# Patient Record
Sex: Female | Born: 1935 | Race: White | Hispanic: No | Smoking: Former smoker
Health system: Southern US, Community
[De-identification: ages and names within clinical notes are randomized; demographics above are authoritative.]

## PROBLEM LIST (undated history)

## (undated) DIAGNOSIS — N3281 Overactive bladder: Secondary | ICD-10-CM

## (undated) DIAGNOSIS — N200 Calculus of kidney: Secondary | ICD-10-CM

## (undated) DIAGNOSIS — L309 Dermatitis, unspecified: Secondary | ICD-10-CM

## (undated) DIAGNOSIS — T7840XA Allergy, unspecified, initial encounter: Secondary | ICD-10-CM

## (undated) DIAGNOSIS — I2699 Other pulmonary embolism without acute cor pulmonale: Secondary | ICD-10-CM

## (undated) DIAGNOSIS — G4730 Sleep apnea, unspecified: Secondary | ICD-10-CM

## (undated) DIAGNOSIS — K219 Gastro-esophageal reflux disease without esophagitis: Secondary | ICD-10-CM

## (undated) DIAGNOSIS — L570 Actinic keratosis: Secondary | ICD-10-CM

## (undated) DIAGNOSIS — I10 Essential (primary) hypertension: Secondary | ICD-10-CM

## (undated) DIAGNOSIS — R55 Syncope and collapse: Secondary | ICD-10-CM

## (undated) DIAGNOSIS — E785 Hyperlipidemia, unspecified: Secondary | ICD-10-CM

## (undated) HISTORY — DX: Allergy, unspecified, initial encounter: T78.40XA

## (undated) HISTORY — DX: Other pulmonary embolism without acute cor pulmonale: I26.99

## (undated) HISTORY — DX: Gastro-esophageal reflux disease without esophagitis: K21.9

## (undated) HISTORY — DX: Hyperlipidemia, unspecified: E78.5

## (undated) HISTORY — DX: Sleep apnea, unspecified: G47.30

## (undated) HISTORY — DX: Essential (primary) hypertension: I10

## (undated) HISTORY — DX: Actinic keratosis: L57.0

## (undated) HISTORY — DX: Syncope and collapse: R55

## (undated) HISTORY — PX: OTHER SURGICAL HISTORY: SHX169

## (undated) HISTORY — DX: Calculus of kidney: N20.0

## (undated) HISTORY — DX: Dermatitis, unspecified: L30.9

## (undated) HISTORY — DX: Overactive bladder: N32.81

---

## 2002-06-23 HISTORY — PX: COLONOSCOPY: SHX174

## 2002-08-23 ENCOUNTER — Other Ambulatory Visit: Admission: RE | Admit: 2002-08-23 | Discharge: 2002-08-23 | Payer: Self-pay | Admitting: Family Medicine

## 2002-08-23 ENCOUNTER — Encounter: Payer: Self-pay | Admitting: Family Medicine

## 2002-08-23 LAB — CONVERTED CEMR LAB: Pap Smear: NORMAL

## 2002-09-27 ENCOUNTER — Encounter: Payer: Self-pay | Admitting: Family Medicine

## 2002-09-27 ENCOUNTER — Encounter: Admission: RE | Admit: 2002-09-27 | Discharge: 2002-09-27 | Payer: Self-pay | Admitting: Family Medicine

## 2003-06-02 ENCOUNTER — Encounter: Admission: RE | Admit: 2003-06-02 | Discharge: 2003-06-02 | Payer: Self-pay | Admitting: Family Medicine

## 2004-07-31 ENCOUNTER — Encounter: Admission: RE | Admit: 2004-07-31 | Discharge: 2004-07-31 | Payer: Self-pay | Admitting: Family Medicine

## 2004-08-13 ENCOUNTER — Ambulatory Visit: Payer: Self-pay | Admitting: Family Medicine

## 2005-01-30 ENCOUNTER — Ambulatory Visit: Payer: Self-pay | Admitting: Family Medicine

## 2005-02-04 ENCOUNTER — Ambulatory Visit: Payer: Self-pay | Admitting: Family Medicine

## 2005-02-28 ENCOUNTER — Ambulatory Visit: Payer: Self-pay | Admitting: Family Medicine

## 2005-04-18 ENCOUNTER — Ambulatory Visit: Payer: Self-pay | Admitting: Family Medicine

## 2006-04-07 ENCOUNTER — Encounter: Admission: RE | Admit: 2006-04-07 | Discharge: 2006-04-07 | Payer: Self-pay | Admitting: Family Medicine

## 2006-04-14 ENCOUNTER — Ambulatory Visit: Payer: Self-pay | Admitting: Family Medicine

## 2006-08-13 ENCOUNTER — Ambulatory Visit: Payer: Self-pay | Admitting: Family Medicine

## 2006-08-13 LAB — CONVERTED CEMR LAB
ALT: 19 units/L (ref 0–40)
AST: 20 units/L (ref 0–37)
Albumin: 3.6 g/dL (ref 3.5–5.2)
BUN: 9 mg/dL (ref 6–23)
Basophils Absolute: 0.1 10*3/uL (ref 0.0–0.1)
Basophils Relative: 1 % (ref 0.0–1.0)
CO2: 31 meq/L (ref 19–32)
Calcium: 9.3 mg/dL (ref 8.4–10.5)
Chloride: 102 meq/L (ref 96–112)
Cholesterol: 197 mg/dL (ref 0–200)
Creatinine, Ser: 0.7 mg/dL (ref 0.4–1.2)
Eosinophils Absolute: 0.3 10*3/uL (ref 0.0–0.6)
Eosinophils Relative: 4.4 % (ref 0.0–5.0)
GFR calc Af Amer: 104 mL/min
GFR calc non Af Amer: 86 mL/min
Glucose, Bld: 105 mg/dL — ABNORMAL HIGH (ref 70–99)
HCT: 42 % (ref 36.0–46.0)
HDL: 46.3 mg/dL (ref >39.0)
Hemoglobin: 14.5 g/dL (ref 12.0–15.0)
LDL Cholesterol: 120 mg/dL — ABNORMAL HIGH (ref 0–99)
Lymphocytes Relative: 41.7 % (ref 12.0–46.0)
MCHC: 34.6 g/dL (ref 30.0–36.0)
MCV: 87 fL (ref 78.0–100.0)
Monocytes Absolute: 0.7 10*3/uL (ref 0.2–0.7)
Monocytes Relative: 8.8 % (ref 3.0–11.0)
Neutro Abs: 3.5 10*3/uL (ref 1.4–7.7)
Neutrophils Relative %: 44.1 % (ref 43.0–77.0)
Phosphorus: 2.7 mg/dL (ref 2.3–4.6)
Platelets: 318 10*3/uL (ref 150–400)
Potassium: 3.8 meq/L (ref 3.5–5.1)
RBC: 4.83 M/uL (ref 3.87–5.11)
RDW: 12.6 % (ref 11.5–14.6)
Sodium: 140 meq/L (ref 135–145)
TSH: 2.4 microintl units/mL (ref 0.35–5.50)
Total CHOL/HDL Ratio: 4.3
Triglycerides: 154 mg/dL — ABNORMAL HIGH (ref 0–149)
VLDL: 31 mg/dL (ref 0–40)
WBC: 7.9 10*3/uL (ref 4.5–10.5)

## 2006-11-20 ENCOUNTER — Telehealth (INDEPENDENT_AMBULATORY_CARE_PROVIDER_SITE_OTHER): Payer: Self-pay | Admitting: *Deleted

## 2007-02-20 ENCOUNTER — Ambulatory Visit: Payer: Self-pay | Admitting: Family Medicine

## 2007-08-09 ENCOUNTER — Encounter: Payer: Self-pay | Admitting: Family Medicine

## 2007-08-10 ENCOUNTER — Encounter: Payer: Self-pay | Admitting: Family Medicine

## 2007-11-01 ENCOUNTER — Encounter: Payer: Self-pay | Admitting: Family Medicine

## 2007-11-09 ENCOUNTER — Telehealth: Payer: Self-pay | Admitting: Family Medicine

## 2007-11-10 ENCOUNTER — Ambulatory Visit: Payer: Self-pay | Admitting: Family Medicine

## 2007-11-10 DIAGNOSIS — N318 Other neuromuscular dysfunction of bladder: Secondary | ICD-10-CM | POA: Insufficient documentation

## 2007-11-10 DIAGNOSIS — J309 Allergic rhinitis, unspecified: Secondary | ICD-10-CM | POA: Insufficient documentation

## 2007-11-10 DIAGNOSIS — G4730 Sleep apnea, unspecified: Secondary | ICD-10-CM | POA: Insufficient documentation

## 2007-11-10 DIAGNOSIS — K219 Gastro-esophageal reflux disease without esophagitis: Secondary | ICD-10-CM | POA: Insufficient documentation

## 2007-11-10 DIAGNOSIS — R0989 Other specified symptoms and signs involving the circulatory and respiratory systems: Secondary | ICD-10-CM | POA: Insufficient documentation

## 2007-11-10 DIAGNOSIS — K449 Diaphragmatic hernia without obstruction or gangrene: Secondary | ICD-10-CM | POA: Insufficient documentation

## 2007-11-10 DIAGNOSIS — I10 Essential (primary) hypertension: Secondary | ICD-10-CM | POA: Insufficient documentation

## 2007-11-10 DIAGNOSIS — M1990 Unspecified osteoarthritis, unspecified site: Secondary | ICD-10-CM | POA: Insufficient documentation

## 2007-11-10 DIAGNOSIS — Z87442 Personal history of urinary calculi: Secondary | ICD-10-CM | POA: Insufficient documentation

## 2007-11-10 DIAGNOSIS — R0609 Other forms of dyspnea: Secondary | ICD-10-CM

## 2007-11-16 LAB — CONVERTED CEMR LAB
ALT: 21 units/L (ref 0–35)
AST: 20 units/L (ref 0–37)
Albumin: 4 g/dL (ref 3.5–5.2)
Alkaline Phosphatase: 78 units/L (ref 39–117)
BUN: 9 mg/dL (ref 6–23)
Basophils Absolute: 0.1 10*3/uL (ref 0.0–0.1)
Basophils Relative: 0.9 % (ref 0.0–1.0)
Bilirubin, Direct: 0.1 mg/dL (ref 0.0–0.3)
CO2: 31 meq/L (ref 19–32)
Calcium: 9.3 mg/dL (ref 8.4–10.5)
Chloride: 106 meq/L (ref 96–112)
Cholesterol: 195 mg/dL (ref 0–200)
Creatinine, Ser: 0.8 mg/dL (ref 0.4–1.2)
Eosinophils Absolute: 0.3 10*3/uL (ref 0.0–0.7)
Eosinophils Relative: 4.5 % (ref 0.0–5.0)
GFR calc Af Amer: 88 mL/min
GFR calc non Af Amer: 73 mL/min
Glucose, Bld: 93 mg/dL (ref 70–99)
HCT: 45.2 % (ref 36.0–46.0)
HDL: 43.8 mg/dL (ref >39.0)
Hemoglobin: 15.2 g/dL — ABNORMAL HIGH (ref 12.0–15.0)
LDL Cholesterol: 126 mg/dL — ABNORMAL HIGH (ref 0–99)
Lymphocytes Relative: 26 % (ref 12.0–46.0)
MCHC: 33.7 g/dL (ref 30.0–36.0)
MCV: 88.7 fL (ref 78.0–100.0)
Monocytes Absolute: 0.6 10*3/uL (ref 0.1–1.0)
Monocytes Relative: 8.3 % (ref 3.0–12.0)
Neutro Abs: 4.7 10*3/uL (ref 1.4–7.7)
Neutrophils Relative %: 60.3 % (ref 43.0–77.0)
Phosphorus: 3 mg/dL (ref 2.3–4.6)
Platelets: 306 10*3/uL (ref 150–400)
Potassium: 4.2 meq/L (ref 3.5–5.1)
Pro B Natriuretic peptide (BNP): 28 pg/mL (ref 0.0–100.0)
RBC: 5.1 M/uL (ref 3.87–5.11)
RDW: 13.1 % (ref 11.5–14.6)
Sodium: 143 meq/L (ref 135–145)
TSH: 2.09 microintl units/mL (ref 0.35–5.50)
Total Bilirubin: 0.8 mg/dL (ref 0.3–1.2)
Total CHOL/HDL Ratio: 4.5
Total Protein: 7.3 g/dL (ref 6.0–8.3)
Triglycerides: 124 mg/dL (ref 0–149)
VLDL: 25 mg/dL (ref 0–40)
WBC: 7.7 10*3/uL (ref 4.5–10.5)

## 2007-11-24 ENCOUNTER — Ambulatory Visit: Payer: Self-pay | Admitting: Family Medicine

## 2007-11-24 DIAGNOSIS — E785 Hyperlipidemia, unspecified: Secondary | ICD-10-CM | POA: Insufficient documentation

## 2007-11-29 ENCOUNTER — Encounter: Payer: Self-pay | Admitting: Family Medicine

## 2007-12-06 ENCOUNTER — Ambulatory Visit: Payer: Self-pay | Admitting: Cardiology

## 2007-12-09 ENCOUNTER — Ambulatory Visit: Payer: Self-pay | Admitting: Family Medicine

## 2007-12-10 ENCOUNTER — Telehealth: Payer: Self-pay | Admitting: Family Medicine

## 2007-12-10 LAB — CONVERTED CEMR LAB
Albumin: 3.8 g/dL (ref 3.5–5.2)
BUN: 14 mg/dL (ref 6–23)
CO2: 31 meq/L (ref 19–32)
Calcium: 9.4 mg/dL (ref 8.4–10.5)
Chloride: 96 meq/L (ref 96–112)
Creatinine, Ser: 0.7 mg/dL (ref 0.4–1.2)
GFR calc Af Amer: 103 mL/min
GFR calc non Af Amer: 85 mL/min
Glucose, Bld: 98 mg/dL (ref 70–99)
Phosphorus: 3.4 mg/dL (ref 2.3–4.6)
Potassium: 3.7 meq/L (ref 3.5–5.1)
Sodium: 136 meq/L (ref 135–145)

## 2007-12-13 ENCOUNTER — Encounter: Payer: Self-pay | Admitting: Family Medicine

## 2007-12-13 ENCOUNTER — Ambulatory Visit: Payer: Self-pay

## 2008-01-03 ENCOUNTER — Ambulatory Visit: Payer: Self-pay | Admitting: Cardiology

## 2008-07-07 ENCOUNTER — Telehealth: Payer: Self-pay | Admitting: Family Medicine

## 2008-07-10 ENCOUNTER — Ambulatory Visit: Payer: Self-pay | Admitting: Family Medicine

## 2008-07-18 ENCOUNTER — Ambulatory Visit: Payer: Self-pay | Admitting: Family Medicine

## 2008-07-19 LAB — CONVERTED CEMR LAB
Albumin: 3.3 g/dL — ABNORMAL LOW (ref 3.5–5.2)
BUN: 10 mg/dL (ref 6–23)
CO2: 29 meq/L (ref 19–32)
Calcium: 9.1 mg/dL (ref 8.4–10.5)
Chloride: 89 meq/L — ABNORMAL LOW (ref 96–112)
Creatinine, Ser: 0.8 mg/dL (ref 0.4–1.2)
GFR calc Af Amer: 88 mL/min
GFR calc non Af Amer: 73 mL/min
Glucose, Bld: 125 mg/dL — ABNORMAL HIGH (ref 70–99)
Phosphorus: 2 mg/dL — ABNORMAL LOW (ref 2.3–4.6)
Potassium: 3.2 meq/L — ABNORMAL LOW (ref 3.5–5.1)
Sodium: 128 meq/L — ABNORMAL LOW (ref 135–145)

## 2008-07-20 ENCOUNTER — Inpatient Hospital Stay (HOSPITAL_COMMUNITY): Admission: EM | Admit: 2008-07-20 | Discharge: 2008-07-26 | Payer: Self-pay | Admitting: Emergency Medicine

## 2008-07-20 ENCOUNTER — Encounter: Payer: Self-pay | Admitting: Family Medicine

## 2008-07-20 ENCOUNTER — Ambulatory Visit: Payer: Self-pay | Admitting: Internal Medicine

## 2008-07-21 ENCOUNTER — Encounter: Payer: Self-pay | Admitting: Internal Medicine

## 2008-07-22 ENCOUNTER — Encounter: Payer: Self-pay | Admitting: Family Medicine

## 2008-07-25 ENCOUNTER — Encounter: Payer: Self-pay | Admitting: Family Medicine

## 2008-07-31 ENCOUNTER — Ambulatory Visit: Payer: Self-pay | Admitting: Family Medicine

## 2008-07-31 ENCOUNTER — Telehealth: Payer: Self-pay | Admitting: Family Medicine

## 2008-07-31 DIAGNOSIS — K8020 Calculus of gallbladder without cholecystitis without obstruction: Secondary | ICD-10-CM | POA: Insufficient documentation

## 2008-07-31 DIAGNOSIS — R609 Edema, unspecified: Secondary | ICD-10-CM | POA: Insufficient documentation

## 2008-08-02 ENCOUNTER — Encounter (INDEPENDENT_AMBULATORY_CARE_PROVIDER_SITE_OTHER): Payer: Self-pay | Admitting: *Deleted

## 2008-08-02 LAB — CONVERTED CEMR LAB
Albumin: 3.4 g/dL — ABNORMAL LOW (ref 3.5–5.2)
BUN: 17 mg/dL (ref 6–23)
CO2: 27 meq/L (ref 19–32)
Calcium: 9.9 mg/dL (ref 8.4–10.5)
Chloride: 95 meq/L — ABNORMAL LOW (ref 96–112)
Creatinine, Ser: 0.8 mg/dL (ref 0.4–1.2)
GFR calc Af Amer: 88 mL/min
GFR calc non Af Amer: 73 mL/min
Glucose, Bld: 101 mg/dL — ABNORMAL HIGH (ref 70–99)
Phosphorus: 3.4 mg/dL (ref 2.3–4.6)
Potassium: 4.5 meq/L (ref 3.5–5.1)
Sodium: 130 meq/L — ABNORMAL LOW (ref 135–145)

## 2008-08-03 ENCOUNTER — Telehealth: Payer: Self-pay | Admitting: Family Medicine

## 2008-08-07 ENCOUNTER — Encounter (INDEPENDENT_AMBULATORY_CARE_PROVIDER_SITE_OTHER): Payer: Self-pay | Admitting: *Deleted

## 2008-09-11 ENCOUNTER — Telehealth: Payer: Self-pay | Admitting: Family Medicine

## 2008-09-12 ENCOUNTER — Ambulatory Visit: Payer: Self-pay | Admitting: Family Medicine

## 2008-09-12 LAB — CONVERTED CEMR LAB
BUN: 15 mg/dL (ref 6–23)
CO2: 32 meq/L (ref 19–32)
Calcium: 9.4 mg/dL (ref 8.4–10.5)
Chloride: 102 meq/L (ref 96–112)
Creatinine, Ser: 0.6 mg/dL (ref 0.4–1.2)
GFR calc non Af Amer: 101.5 mL/min (ref >60)
Glucose, Bld: 93 mg/dL (ref 70–99)
Potassium: 3.7 meq/L (ref 3.5–5.1)
Sodium: 141 meq/L (ref 135–145)

## 2008-09-25 ENCOUNTER — Ambulatory Visit: Payer: Self-pay | Admitting: Family Medicine

## 2008-10-25 ENCOUNTER — Ambulatory Visit: Payer: Self-pay | Admitting: Family Medicine

## 2008-10-26 LAB — CONVERTED CEMR LAB
BUN: 13 mg/dL (ref 6–23)
CO2: 32 meq/L (ref 19–32)
Calcium: 9.7 mg/dL (ref 8.4–10.5)
Chloride: 105 meq/L (ref 96–112)
Creatinine, Ser: 0.7 mg/dL (ref 0.4–1.2)
GFR calc non Af Amer: 84.93 mL/min (ref >60)
Glucose, Bld: 102 mg/dL — ABNORMAL HIGH (ref 70–99)
Potassium: 3.7 meq/L (ref 3.5–5.1)
Sodium: 142 meq/L (ref 135–145)

## 2009-03-26 ENCOUNTER — Ambulatory Visit: Payer: Self-pay | Admitting: Family Medicine

## 2009-04-03 LAB — CONVERTED CEMR LAB
Albumin: 4.1 g/dL (ref 3.5–5.2)
BUN: 10 mg/dL (ref 6–23)
Basophils Absolute: 0.1 10*3/uL (ref 0.0–0.1)
Basophils Relative: 0.5 % (ref 0.0–3.0)
CO2: 32 meq/L (ref 19–32)
Calcium: 10.1 mg/dL (ref 8.4–10.5)
Chloride: 101 meq/L (ref 96–112)
Creatinine, Ser: 0.7 mg/dL (ref 0.4–1.2)
Eosinophils Absolute: 0.2 10*3/uL (ref 0.0–0.7)
Eosinophils Relative: 2.3 % (ref 0.0–5.0)
Folate: >20 ng/mL
GFR calc non Af Amer: 84.85 mL/min (ref >60)
Glucose, Bld: 103 mg/dL — ABNORMAL HIGH (ref 70–99)
HCT: 44.9 % (ref 36.0–46.0)
Hemoglobin: 15.3 g/dL — ABNORMAL HIGH (ref 12.0–15.0)
Lymphocytes Relative: 24.1 % (ref 12.0–46.0)
Lymphs Abs: 2.6 10*3/uL (ref 0.7–4.0)
MCHC: 34.1 g/dL (ref 30.0–36.0)
MCV: 91 fL (ref 78.0–100.0)
Monocytes Absolute: 1 10*3/uL (ref 0.1–1.0)
Monocytes Relative: 9.5 % (ref 3.0–12.0)
Neutro Abs: 6.8 10*3/uL (ref 1.4–7.7)
Neutrophils Relative %: 63.6 % (ref 43.0–77.0)
Phosphorus: 2.4 mg/dL (ref 2.3–4.6)
Platelets: 317 10*3/uL (ref 150.0–400.0)
Potassium: 3.7 meq/L (ref 3.5–5.1)
RBC: 4.94 M/uL (ref 3.87–5.11)
RDW: 12.3 % (ref 11.5–14.6)
Sodium: 139 meq/L (ref 135–145)
TSH: 1.96 microintl units/mL (ref 0.35–5.50)
Vitamin B-12: 324 pg/mL (ref 211–911)
WBC: 10.7 10*3/uL — ABNORMAL HIGH (ref 4.5–10.5)

## 2009-04-04 ENCOUNTER — Encounter: Payer: Self-pay | Admitting: Family Medicine

## 2009-04-04 ENCOUNTER — Ambulatory Visit: Payer: Self-pay | Admitting: Ophthalmology

## 2009-04-17 ENCOUNTER — Ambulatory Visit: Payer: Self-pay | Admitting: Ophthalmology

## 2009-06-19 ENCOUNTER — Ambulatory Visit: Payer: Self-pay | Admitting: Ophthalmology

## 2009-06-26 ENCOUNTER — Ambulatory Visit: Payer: Self-pay | Admitting: Ophthalmology

## 2009-07-24 ENCOUNTER — Telehealth: Payer: Self-pay | Admitting: Family Medicine

## 2009-08-28 ENCOUNTER — Ambulatory Visit: Payer: Self-pay | Admitting: Family Medicine

## 2009-08-29 LAB — CONVERTED CEMR LAB
ALT: 15 units/L (ref 0–35)
AST: 18 units/L (ref 0–37)
Albumin: 4 g/dL (ref 3.5–5.2)
BUN: 10 mg/dL (ref 6–23)
Basophils Absolute: 0.1 10*3/uL (ref 0.0–0.1)
Basophils Relative: 0.8 % (ref 0.0–3.0)
CO2: 33 meq/L — ABNORMAL HIGH (ref 19–32)
Calcium: 10.1 mg/dL (ref 8.4–10.5)
Chloride: 107 meq/L (ref 96–112)
Cholesterol: 169 mg/dL (ref 0–200)
Creatinine, Ser: 0.8 mg/dL (ref 0.4–1.2)
Eosinophils Absolute: 0.2 10*3/uL (ref 0.0–0.7)
Eosinophils Relative: 3 % (ref 0.0–5.0)
GFR calc non Af Amer: 72.65 mL/min (ref >60)
Glucose, Bld: 83 mg/dL (ref 70–99)
HCT: 43.9 % (ref 36.0–46.0)
HDL: 58.6 mg/dL (ref >39.00)
Hemoglobin: 14.3 g/dL (ref 12.0–15.0)
LDL Cholesterol: 88 mg/dL (ref 0–99)
Lymphocytes Relative: 30.2 % (ref 12.0–46.0)
Lymphs Abs: 2.1 10*3/uL (ref 0.7–4.0)
MCHC: 32.5 g/dL (ref 30.0–36.0)
MCV: 93.2 fL (ref 78.0–100.0)
Monocytes Absolute: 0.6 10*3/uL (ref 0.1–1.0)
Monocytes Relative: 8.3 % (ref 3.0–12.0)
Neutro Abs: 4 10*3/uL (ref 1.4–7.7)
Neutrophils Relative %: 57.7 % (ref 43.0–77.0)
Phosphorus: 3.4 mg/dL (ref 2.3–4.6)
Platelets: 286 10*3/uL (ref 150.0–400.0)
Potassium: 5 meq/L (ref 3.5–5.1)
RBC: 4.7 M/uL (ref 3.87–5.11)
RDW: 12.5 % (ref 11.5–14.6)
Sodium: 143 meq/L (ref 135–145)
TSH: 2.24 microintl units/mL (ref 0.35–5.50)
Total CHOL/HDL Ratio: 3
Triglycerides: 113 mg/dL (ref 0.0–149.0)
VLDL: 22.6 mg/dL (ref 0.0–40.0)
WBC: 7 10*3/uL (ref 4.5–10.5)

## 2009-09-03 ENCOUNTER — Telehealth: Payer: Self-pay | Admitting: Family Medicine

## 2010-04-15 ENCOUNTER — Ambulatory Visit: Payer: Self-pay | Admitting: Family Medicine

## 2010-04-18 ENCOUNTER — Ambulatory Visit: Payer: Self-pay | Admitting: Family Medicine

## 2010-04-18 DIAGNOSIS — R059 Cough, unspecified: Secondary | ICD-10-CM | POA: Insufficient documentation

## 2010-04-18 DIAGNOSIS — R05 Cough: Secondary | ICD-10-CM

## 2010-07-25 NOTE — Progress Notes (Signed)
Summary: Rx Spironolactone  Phone Note Refill Request Call back at [phone] Message from:  [facility] on July 24, 2009 10:35 AM  Refills Requested: Medication #1:  ALDACTONE 25 MG TABS 1 by mouth once daily.   Last Refilled: 06/18/2009 Received faxed refill request, please advise   Method Requested: Electronic Initial call taken by: Linde Gillis CMA Duncan Dull),  July 24, 2009 10:35 AM  Follow-up for Phone Call        px written on EMR for call in  Follow-up by: Judith Part MD,  July 24, 2009 11:01 AM  Additional Follow-up for Phone Call Additional follow up Details #1::        Rx called to pharmacy Additional Follow-up by: Linde Gillis CMA Duncan Dull),  July 24, 2009 11:40 AM    Prescriptions: ALDACTONE 25 MG TABS (SPIRONOLACTONE) 1 by mouth once daily  #30 x 5   Entered and Authorized by:   Judith Part MD   Signed by:   Judith Part MD on 07/24/2009   Method used:   Telephoned to ...       [facility]* (retail)       [address]       [place], [place]  [address]       Ph: [phone]       Fax: [phone]   RxID:   [id number]

## 2010-07-25 NOTE — Assessment & Plan Note (Signed)
Summary: F/U REFILL MEDS/CLE   Vital Signs:  Patient profile:   77 year old female Height:      62 inches Weight:      187.50 pounds Temp:     98.3 degrees F oral Pulse rate:   80 / minute Pulse rhythm:   regular BP sitting:   136 / 76  (left arm) Cuff size:   large  Vitals Entered By: Lewanda Rife LPN (August 28, 9145 9:16 AM)   History of Present Illness: is here for visit and med refil for HTN and edema   she is feeling ok overall   wt is stable-- wants to work on that but too concerned about family problems   imp bp today 136/76- last vsit changed hctz to spironolactone needs labs for that still some edema but better  likes the aldactone better   ? due for lipids  is sort of watching sat fats in diet- too much red meat and eating out  did not get cxr last time-- no chance to do it  sob is not much better  sob without much exertion- she suspects from deconditioning and wt   lots of stress -husb had valve repl surgery -- at [facility] and cabg  he is turning the corner     Allergies: 1)  ! Codeine 2)  ! Levaquin 3)  ! * Oxytrol Patch 4)  ! Neosporin  Past History:  Past Medical History: Last updated: 09/25/2008 hyperlipidemia HH-- with GERD sleep apnea  overactive bladderOA kidney stones HTN  allergic rhinitis AKs  chronic dermatitis face (allergic to many products)  cardiology--Dr Ladell Heads  Past Surgical History: Last updated: 09/25/2008 see preload-- reviewed rectocele repair (NOT HYSTERECTOMY) broken nose cath at [facility] in [facility]- clear  4/03 stress testing with EF 77 %  MR and TR dexa nl 2002 colonosc polyp/tics 2004 6/09 adenosine cardiolite-- nl  6/09 CT lungs-- scarring in [facility] and [facility] 2010 pneumonia (also hyponatremia, hypokalemia)  Family History: Last updated: 2007-11-28 father CAD mother- died of stomach ca  Social History: Last updated: 08/28/2009 married- husband with coronary artery disease and valve dz has grand  children and great grandchildren non smoker-- quit years ago occ alcohol  Risk Factors: Smoking Status: quit (11/10/2007)  Social History: married- husband with coronary artery disease and valve dz has grand children and great grandchildren non smoker-- quit years ago occ alcohol  Review of Systems General:  Complains of fatigue; denies chills, fever, loss of appetite, and malaise. Eyes:  Denies blurring and eye irritation. CV:  Denies chest pain or discomfort, lightheadness, palpitations, and shortness of breath with exertion. Resp:  Denies cough, shortness of breath, and wheezing. GI:  Denies abdominal pain, bloody stools, change in bowel habits, and indigestion. GU:  Complains of incontinence; denies urinary frequency. MS:  Complains of stiffness. Derm:  Denies itching, lesion(s), poor wound healing, and rash. Neuro:  Denies numbness and tingling. Psych:  stressed but overall doing ok . Endo:  Denies excessive thirst and excessive urination. Heme:  Denies abnormal bruising and bleeding.  Physical Exam  General:  overweight but generally well appearing  Head:  normocephalic, atraumatic, and no abnormalities observed.   Eyes:  vision grossly intact, pupils equal, pupils round, pupils reactive to light, and no injection.   Mouth:  MMM Neck:  supple with full rom and no masses or thyromegally, no JVD or carotid bruit  Lungs:  Normal respiratory effort, chest expands symmetrically. Lungs are clear to auscultation,  no crackles or wheezes. Heart:  Normal rate and regular rhythm. S1 and S2 normal without gallop, murmur, click, rub or other extra sounds. Abdomen:  Bowel sounds positive,abdomen soft and non-tender without masses, organomegaly or hernias noted. Msk:  No deformity or scoliosis noted of thoracic or lumbar spine.   Pulses:  R and L carotid,radial,femoral,dorsalis pedis and posterior tibial pulses are full and equal bilaterally Extremities:  improved edema  Neurologic:   sensation intact to light touch, gait normal, and DTRs symmetrical and normal.   Skin:  Intact without suspicious lesions or rashes Cervical Nodes:  No lymphadenopathy noted Psych:  cheerful despite stress she describes   Impression & Recommendations:  Problem # 1:  HYPERTENSION (ICD-401.9) Assessment Improved  bp is improved and pt tol aldactone well  disc imp of diet and exercise no changes lab today Her updated medication list for this problem includes:    Diovan 160 Mg Tabs (Valsartan) .Marland Kitchen... 1 by mouth once daily    Verapamil Hcl Cr 240 Mg Cp24 (Verapamil hcl) .Marland Kitchen... 1 by mouth once daily    Aldactone 25 Mg Tabs (Spironolactone) .Marland Kitchen... 1 by mouth once daily  Orders: Venipuncture (42595) TLB-Lipid Panel (80061-LIPID) TLB-Renal Function Panel (80069-RENAL) TLB-ALT (SGPT) (84460-ALT) TLB-AST (SGOT) (84450-SGOT) TLB-TSH (Thyroid Stimulating Hormone) (84443-TSH) TLB-CBC Platelet - w/Differential (85025-CBCD)  BP today: 136/76 Prior BP: 158/84 (03/26/2009)  Labs Reviewed: K+: 3.7 (03/26/2009) Creat: : 0.7 (03/26/2009)   Chol: 195 (11/10/2007)   HDL: 43.8 (11/10/2007)   LDL: 126 (11/10/2007)   TG: 124 (11/10/2007)  Problem # 2:  EDEMA (ICD-782.3) Assessment: Improved improved on aldactone lab today disc low sodium diet  Her updated medication list for this problem includes:    Aldactone 25 Mg Tabs (Spironolactone) .Marland Kitchen... 1 by mouth once daily  Problem # 3:  HYPERLIPIDEMIA (ICD-272.4) Assessment: Unchanged  labs today rev low sat fat diet  will update f/u 6 mo  Orders: Venipuncture (63875) TLB-Lipid Panel (80061-LIPID) TLB-Renal Function Panel (80069-RENAL) TLB-ALT (SGPT) (84460-ALT) TLB-AST (SGOT) (84450-SGOT) TLB-TSH (Thyroid Stimulating Hormone) (84443-TSH) TLB-CBC Platelet - w/Differential (85025-CBCD)  Labs Reviewed: SGOT: 20 (11/10/2007)   SGPT: 21 (11/10/2007)   HDL:43.8 (11/10/2007), 46.3 (08/13/2006)  LDL:126 (11/10/2007), 120 (08/13/2006)  Chol:195  (11/10/2007), 197 (08/13/2006)  Trig:124 (11/10/2007), 154 (08/13/2006)  Problem # 4:  DYSPNEA ON EXERTION (ICD-786.09) Assessment: Unchanged ongoing - no change pt needs cxr for this and also post pneumonia- but has not gone urged to go - pend report Her updated medication list for this problem includes:    Aldactone 25 Mg Tabs (Spironolactone) .Marland Kitchen... 1 by mouth once daily  Complete Medication List: 1)  Nexium 40 Mg Cpdr (Esomeprazole magnesium) .Marland Kitchen.. 1 by mouth once daily 2)  Diovan 160 Mg Tabs (Valsartan) .Marland Kitchen.. 1 by mouth once daily 3)  Verapamil Hcl Cr 240 Mg Cp24 (Verapamil hcl) .Marland Kitchen.. 1 by mouth once daily 4)  Ambien 5 Mg Tabs (Zolpidem tartrate) .... Take one by mouth at bedtime as needed 5)  Aldactone 25 Mg Tabs (Spironolactone) .Marland Kitchen.. 1 by mouth once daily 6)  Juice Plus Fibre Liqd (Nutritional supplements) .... Otc as directed.  Patient Instructions: 1)  blood pressure is much better 120/78 2)  go get your chest x ray when you can  3)  I sent px to [facility] 4)  here are long term px for mail  5)  work on healthy diet and exercise  6)  follow up with me in about 6 months (earlier if needed)  Prescriptions: ALDACTONE 25 MG TABS (  SPIRONOLACTONE) 1 by mouth once daily  #90 x 3   Entered and Authorized by:   Judith Part MD   Signed by:   Judith Part MD on 08/28/2009   Method used:   Print then Give to Patient   RxID:   [id number] VERAPAMIL HCL CR 240 MG  CP24 (VERAPAMIL HCL) 1 by mouth once daily  #90 x 3   Entered and Authorized by:   Judith Part MD   Signed by:   Judith Part MD on 08/28/2009   Method used:   Print then Give to Patient   RxID:   [id number] DIOVAN 160 MG  TABS (VALSARTAN) 1 by mouth once daily  #90 x 3   Entered and Authorized by:   Judith Part MD   Signed by:   Judith Part MD on 08/28/2009   Method used:   Print then Give to Patient   RxID:   [id number] NEXIUM 40 MG CPDR (ESOMEPRAZOLE MAGNESIUM) 1 by mouth once daily   #90 x 3   Entered and Authorized by:   Judith Part MD   Signed by:   Judith Part MD on 08/28/2009   Method used:   Print then Give to Patient   RxID:   [id number] VERAPAMIL HCL CR 240 MG  CP24 (VERAPAMIL HCL) 1 by mouth once daily  #30 x 0   Entered and Authorized by:   Judith Part MD   Signed by:   Judith Part MD on 08/28/2009   Method used:   Electronically to        [facility]* (retail)       [address]       [place], [place]  [address]       Ph: [phone]       Fax: [phone]   RxID:   [id number] DIOVAN 160 MG  TABS (VALSARTAN) 1 by mouth once daily  #30 x 0   Entered and Authorized by:   Judith Part MD   Signed by:   Judith Part MD on 08/28/2009   Method used:   Electronically to        [facility]* (retail)       [address]       [place], [place]  [address]       Ph: [phone]       Fax: [phone]   RxID:   [id number] NEXIUM 40 MG CPDR (ESOMEPRAZOLE MAGNESIUM) 1 by mouth once daily  #30 x 0   Entered and Authorized by:   Judith Part MD   Signed by:   Judith Part MD on 08/28/2009   Method used:   Electronically to        [facility]* (retail)       [address]       [place], [place]  [address]       Ph: [phone]       Fax: [phone]   RxID:   [id number]   Current Allergies (reviewed today): ! CODEINE ! LEVAQUIN ! [facility] ! NEOSPORIN

## 2010-07-25 NOTE — Assessment & Plan Note (Signed)
Summary: ST,CONGESTION/CLE   Vital Signs:  Patient profile:   77 year old female Height:      62 inches Weight:      186.25 pounds BMI:     34.19 Temp:     98.2 degrees F oral Pulse rate:   76 / minute Pulse rhythm:   regular BP sitting:   120 / 72  (left arm) Cuff size:   large  Vitals Entered By: Lewanda Rife LPN (April 15, 2010 12:12 PM) CC: head congested, runny nose, drainage back of throat, productive cough with yellow mucus   History of Present Illness: was around a lot of people at a wedding 2 wk ago   got back and felt really tired  then started with head congestion and runny nose and post nasal drip  sore throat was mild  got better and then got worse again cold and hot  and now has productive cough  some nausea and no vomiting   needs refil on aldactone- doing well with that  this controls edema    Allergies: 1)  ! Codeine 2)  ! Levaquin 3)  ! * Oxytrol Patch 4)  ! Neosporin  Past History:  Past Medical History: Last updated: 09/25/2008 hyperlipidemia HH-- with GERD sleep apnea  overactive bladderOA kidney stones HTN  allergic rhinitis AKs  chronic dermatitis face (allergic to many products)  cardiology--Dr Ladell Heads  Past Surgical History: Last updated: 09/25/2008 see preload-- reviewed rectocele repair (NOT HYSTERECTOMY) broken nose cath at [facility] in 1980- clear  4/03 stress testing with EF 77 %  MR and TR dexa nl 2002 colonosc polyp/tics 2004 6/09 adenosine cardiolite-- nl  6/09 CT lungs-- scarring in [facility] and [facility] 2010 pneumonia (also hyponatremia, hypokalemia)  Family History: Last updated: 11/28/07 father CAD mother- died of stomach ca  Social History: Last updated: 08/28/2009 married- husband with coronary artery disease and valve dz has grand children and great grandchildren non smoker-- quit years ago occ alcohol  Risk Factors: Smoking Status: quit (11/10/2007)  Review of Systems General:  Complains of  chills, fatigue, and malaise. Eyes:  Denies blurring and eye pain. CV:  Denies chest pain or discomfort, palpitations, shortness of breath with exertion, swelling of feet, and swelling of hands. Resp:  Complains of cough and sputum productive; denies shortness of breath and wheezing. GI:  Denies diarrhea, nausea, and vomiting. Derm:  Denies rash. Neuro:  Denies numbness and tingling.  Physical Exam  General:  overweight but generally well appearing  Head:  normocephalic, atraumatic, and no abnormalities observed.  no sinus tenderness  Eyes:  vision grossly intact, pupils equal, pupils round, and pupils reactive to light.  no conjunctival pallor, injection or icterus  Ears:  R ear normal and L ear normal.   Nose:  nares are injected and congested bilaterally  Mouth:  pharynx pink and moist, no erythema, and postnasal drip.   Neck:  supple with full rom and no masses or thyromegally, no JVD or carotid bruit  Lungs:  mildly harsh bs at bases good air exch no rales/rhonchi or wheeze Heart:  Normal rate and regular rhythm. S1 and S2 normal without gallop, murmur, click, rub or other extra sounds. Pulses:  R and L carotid,radial,femoral,dorsalis pedis and posterior tibial pulses are full and equal bilaterally Extremities:  improved edema --no cce  Skin:  Intact without suspicious lesions or rashes Cervical Nodes:  No lymphadenopathy noted Psych:  normal affect, talkative and pleasant    Impression & Recommendations:  Problem # 1:  BRONCHITIS- ACUTE (ICD-466.0) Assessment New  mild following viral uri recommend sympt care- see pt instructions   pt advised to update me if symptoms worsen or do not improve  will cover with zpack and update Her updated medication list for this problem includes:    Zithromax Z-pak 250 Mg Tabs (Azithromycin) .Marland Kitchen... Take by mouth as directed  Orders: Prescription Created Electronically [phone])  Problem # 2:  EDEMA (ICD-782.3) Assessment:  Unchanged  this and HTN well controlled on aldactone this was refilled today Her updated medication list for this problem includes:    Aldactone 25 Mg Tabs (Spironolactone) .Marland Kitchen... 1 by mouth once daily  Discussed elevation of the legs, use of compression stockings, sodium restiction, and medication use.   Orders: Prescription Created Electronically [phone])  Complete Medication List: 1)  Nexium 40 Mg Cpdr (Esomeprazole magnesium) .Marland Kitchen.. 1 by mouth once daily 2)  Diovan 160 Mg Tabs (Valsartan) .Marland Kitchen.. 1 by mouth once daily 3)  Calan Sr 240 Mg Cr-tabs (Verapamil hcl) .Marland Kitchen.. 1 by mouth once daily 4)  Ambien 5 Mg Tabs (Zolpidem tartrate) .... Take one by mouth at bedtime as needed 5)  Aldactone 25 Mg Tabs (Spironolactone) .Marland Kitchen.. 1 by mouth once daily 6)  Juice Plus Fibre Liqd (Nutritional supplements) .... Otc as directed. 7)  Tylenol Extra Strength 500 Mg Tabs (Acetaminophen) .... Otc as directed. 8)  Zithromax Z-pak 250 Mg Tabs (Azithromycin) .... Take by mouth as directed  Patient Instructions: 1)  take the zithromax for possible bronchitis  2)  rest and get fluids  3)  mucinex is ok for congestion as is nasal saline spray 4)  update me if not improving within a week  Prescriptions: ALDACTONE 25 MG TABS (SPIRONOLACTONE) 1 by mouth once daily  #90 x 1   Entered and Authorized by:   Judith Part MD   Signed by:   Judith Part MD on 04/15/2010   Method used:   Electronically to        [facility]* (retail)       [address]       [place], [place]  [address]       Ph: [phone]       Fax: [phone]   RxID:   [id number] ZITHROMAX Z-PAK 250 MG TABS (AZITHROMYCIN) take by mouth as directed  #1 pack x 0   Entered and Authorized by:   Judith Part MD   Signed by:   Judith Part MD on 04/15/2010   Method used:   Electronically to        [facility]* (retail)       [address]       [place], [place]  [address]       Ph: [phone]       Fax: [phone]   RxID:    [id number]    Orders Added: 1)  Est. Patient Level IV [[address]] 2)  Prescription Created Electronically [phone]    Current Allergies (reviewed today): ! CODEINE ! LEVAQUIN ! [facility] ! NEOSPORIN

## 2010-07-25 NOTE — Progress Notes (Signed)
Summary: Alternative for Verelan  Phone Note From Pharmacy Call back at [phone]   Caller: [facility] Call For: Dr. Milinda Antis  Summary of Call: Received fax from pharmacy stating that Verelan is currently unavailable and has been for the last month.  Wants to know if you will be willing to switch to Calan?  Please advise. Initial call taken by: Linde Gillis CMA Duncan Dull),  September 03, 2009 4:15 PM  Follow-up for Phone Call        is fine to switch to equiv of calan -- just put on med list what he recommends and let pt know there will be a change thanks for the update  Follow-up by: Judith Part MD,  September 03, 2009 5:17 PM  Additional Follow-up for Phone Call Additional follow up Details #1::        Spoke with April at [facility] and she said only difference using Calan would be it will be tablet instead of capsule. She said to order same as Verelan or Verapamil. Pt does not need today. But will be out soon.Please advise. Lewanda Rife LPN  September 03, 2009 5:44 PM   px written on EMR for call in thanks  Additional Follow-up by: Judith Part MD,  September 03, 2009 5:47 PM    Additional Follow-up for Phone Call Additional follow up Details #2::    Medication phoned to [facility]  pharmacy as instructed. Lewanda Rife LPN  September 03, 2009 5:49 PM   New/Updated Medications: CALAN SR 240 MG CR-TABS (VERAPAMIL HCL) 1 by mouth once daily Prescriptions: CALAN SR 240 MG CR-TABS (VERAPAMIL HCL) 1 by mouth once daily  #30 x 11   Entered and Authorized by:   Judith Part MD   Signed by:   Lewanda Rife LPN on 45/40/9811   Method used:   Telephoned to ...       [facility]* (retail)       [address]       [place], [place]  [address]       Ph: [phone]       Fax: [phone]   RxID:   [id number]

## 2010-07-25 NOTE — Assessment & Plan Note (Signed)
Summary: COUGH/CLE   Vital Signs:  Patient profile:   77 year old female Height:      62 inches Weight:      187.50 pounds BMI:     34.42 O2 Sat:      96 % on Room air Temp:     97.7 degrees F oral Pulse rate:   84 / minute Pulse rhythm:   regular BP sitting:   130 / 56  (left arm) Cuff size:   large  Vitals Entered By: Delilah Shan CMA  Dull) (April 18, 2010 3:15 PM)  O2 Flow:  Room air CC: Cough   History of Present Illness: prev note reviewed.  Was fatigued after the wedding.  Had ST and some cough.  Was put on zmax, just finished it today.  Planning on going to [place] next week.  Has had some mild shortness of breath, but this isn't new for patient.  H/o PNA several years ago.    Cough is some better now.  Sneezing is better, diarrhea is better.  "I just don't I'm back to where I need to be yet."  Minimal sputum.  No wheeze.  Voice change hasn't resolved yet.   Allergies: 1)  ! Codeine 2)  ! Levaquin 3)  ! * Oxytrol Patch 4)  ! Neosporin  Review of Systems       See HPI.  Otherwise negative.    Physical Exam  General:  GEN: nad, alert and oriented HEENT: mucous membranes moist, TM w/o erythema, nasal epithelium minimally injected, OP with minimal cobblestoning NECK: supple w/o LA CV: rrr. PULM: ctab, no inc wob ABD: soft, +bs   Impression & Recommendations:  Problem # 1:  COUGH (ICD-786.2) Pt appears to be gradually improving.  No indication for further tx as this point.  Nontoxic.  I think that if she gets some rest, she should be feeling better soon and okay for her tip.  Reassured.  follow up as needed.  She agrees.   Complete Medication List: 1)  Nexium 40 Mg Cpdr (Esomeprazole magnesium) .Marland Kitchen.. 1 by mouth once daily 2)  Diovan 160 Mg Tabs (Valsartan) .Marland Kitchen.. 1 by mouth once daily 3)  Calan Sr 240 Mg Cr-tabs (Verapamil hcl) .Marland Kitchen.. 1 by mouth once daily 4)  Ambien 5 Mg Tabs (Zolpidem tartrate) .... Take one by mouth at bedtime as needed 5)  Aldactone 25 Mg Tabs  (Spironolactone) .Marland Kitchen.. 1 by mouth once daily 6)  Juice Plus Fibre Liqd (Nutritional supplements) .... Otc as directed. 7)  Tylenol Extra Strength 500 Mg Tabs (Acetaminophen) .... Otc as directed. 8)  Zithromax Z-pak 250 Mg Tabs (Azithromycin) .... Take by mouth as directed  Patient Instructions: 1)  I would try to get some rest.  This should gradually get better.  Let me know if you have other concerns.  Take care.  I think you'll feel better by the time you leave town.    Orders Added: 1)  Est. Patient Level III [045[address]]    Current Allergies (reviewed today): ! CODEINE ! LEVAQUIN ! [facility] ! NEOSPORIN

## 2010-10-07 ENCOUNTER — Other Ambulatory Visit: Payer: Self-pay | Admitting: *Deleted

## 2010-10-07 LAB — BASIC METABOLIC PANEL
CO2: 27 mEq/L (ref 19–32)
CO2: 27 mEq/L (ref 19–32)
CO2: 30 mEq/L (ref 19–32)
Calcium: 8.4 mg/dL (ref 8.4–10.5)
Calcium: 8.4 mg/dL (ref 8.4–10.5)
Chloride: 78 mEq/L — CL (ref 96–112)
Chloride: 85 mEq/L — ABNORMAL LOW (ref 96–112)
Creatinine, Ser: 0.58 mg/dL (ref 0.4–1.2)
GFR calc Af Amer: >60 mL/min (ref >60)
GFR calc Af Amer: >60 mL/min (ref >60)
GFR calc Af Amer: >60 mL/min (ref >60)
GFR calc Af Amer: >60 mL/min (ref >60)
GFR calc non Af Amer: >60 mL/min (ref >60)
Glucose, Bld: 114 mg/dL — ABNORMAL HIGH (ref 70–99)
Potassium: 2.7 mEq/L — CL (ref 3.5–5.1)
Sodium: 123 mEq/L — ABNORMAL LOW (ref 135–145)
Sodium: 130 mEq/L — ABNORMAL LOW (ref 135–145)

## 2010-10-07 LAB — DIFFERENTIAL
Basophils Absolute: 0 10*3/uL (ref 0.0–0.1)
Basophils Relative: 0 % (ref 0–1)
Eosinophils Absolute: 0.1 10*3/uL (ref 0.0–0.7)
Eosinophils Relative: 1 % (ref 0–5)
Lymphocytes Relative: 12 % (ref 12–46)
Lymphs Abs: 1.9 10*3/uL (ref 0.7–4.0)
Monocytes Absolute: 1.2 10*3/uL — ABNORMAL HIGH (ref 0.1–1.0)
Monocytes Relative: 8 % (ref 3–12)
Neutro Abs: 12.5 10*3/uL — ABNORMAL HIGH (ref 1.7–7.7)
Neutrophils Relative %: 79 % — ABNORMAL HIGH (ref 43–77)

## 2010-10-07 LAB — HEPATIC FUNCTION PANEL
Alkaline Phosphatase: 159 U/L — ABNORMAL HIGH (ref 39–117)
Bilirubin, Direct: 0.3 mg/dL (ref 0.0–0.3)
Indirect Bilirubin: 0.5 mg/dL (ref 0.3–0.9)
Total Bilirubin: 0.8 mg/dL (ref 0.3–1.2)

## 2010-10-07 LAB — COMPREHENSIVE METABOLIC PANEL
ALT: 29 U/L (ref 0–35)
AST: 21 U/L (ref 0–37)
Albumin: 2.8 g/dL — ABNORMAL LOW (ref 3.5–5.2)
Alkaline Phosphatase: 164 U/L — ABNORMAL HIGH (ref 39–117)
BUN: 8 mg/dL (ref 6–23)
CO2: 24 mEq/L (ref 19–32)
Calcium: 8.7 mg/dL (ref 8.4–10.5)
Chloride: 81 mEq/L — ABNORMAL LOW (ref 96–112)
Creatinine, Ser: 0.61 mg/dL (ref 0.4–1.2)
GFR calc Af Amer: >60 mL/min (ref >60)
GFR calc non Af Amer: >60 mL/min (ref >60)
Glucose, Bld: 117 mg/dL — ABNORMAL HIGH (ref 70–99)
Potassium: 2.8 mEq/L — ABNORMAL LOW (ref 3.5–5.1)
Sodium: 117 mEq/L — CL (ref 135–145)
Total Bilirubin: 1.4 mg/dL — ABNORMAL HIGH (ref 0.3–1.2)
Total Protein: 6.4 g/dL (ref 6.0–8.3)

## 2010-10-07 LAB — CBC
HCT: 34.4 % — ABNORMAL LOW (ref 36.0–46.0)
HCT: 36.7 % (ref 36.0–46.0)
Hemoglobin: 11.7 g/dL — ABNORMAL LOW (ref 12.0–15.0)
Hemoglobin: 12.4 g/dL (ref 12.0–15.0)
MCHC: 33.9 g/dL (ref 30.0–36.0)
MCHC: 33.9 g/dL (ref 30.0–36.0)
MCV: 89.6 fL (ref 78.0–100.0)
MCV: 90.1 fL (ref 78.0–100.0)
Platelets: 362 10*3/uL (ref 150–400)
RBC: 3.82 MIL/uL — ABNORMAL LOW (ref 3.87–5.11)
RBC: 4.09 MIL/uL (ref 3.87–5.11)
RDW: 12.5 % (ref 11.5–15.5)
WBC: 15.8 10*3/uL — ABNORMAL HIGH (ref 4.0–10.5)

## 2010-10-07 LAB — POCT CARDIAC MARKERS
CKMB, poc: 2.3 ng/mL (ref 1.0–8.0)
Myoglobin, poc: 393 ng/mL (ref 12–200)
Troponin i, poc: <0.05 ng/mL (ref 0.00–0.09)

## 2010-10-07 LAB — URINALYSIS, ROUTINE W REFLEX MICROSCOPIC
Glucose, UA: NEGATIVE mg/dL
Hgb urine dipstick: NEGATIVE
Ketones, ur: NEGATIVE mg/dL
pH: 6.5 (ref 5.0–8.0)

## 2010-10-07 LAB — BRAIN NATRIURETIC PEPTIDE: Pro B Natriuretic peptide (BNP): <30 pg/mL (ref 0.0–100.0)

## 2010-10-07 LAB — URINE CULTURE

## 2010-10-07 MED ORDER — VALSARTAN 160 MG PO TABS: 160.0000 mg | ORAL_TABLET | Freq: Every day | ORAL | Status: DC

## 2010-10-08 LAB — CBC
HCT: 36 % (ref 36.0–46.0)
Hemoglobin: 12.4 g/dL (ref 12.0–15.0)
MCHC: 33.8 g/dL (ref 30.0–36.0)
MCV: 90.7 fL (ref 78.0–100.0)
Platelets: 422 10*3/uL — ABNORMAL HIGH (ref 150–400)
RBC: 4.05 MIL/uL (ref 3.87–5.11)
WBC: 12.7 10*3/uL — ABNORMAL HIGH (ref 4.0–10.5)

## 2010-10-08 LAB — DIFFERENTIAL
Basophils Relative: 1 % (ref 0–1)
Eosinophils Absolute: 0.5 10*3/uL (ref 0.0–0.7)
Lymphocytes Relative: 30 % (ref 12–46)
Lymphs Abs: 3.5 10*3/uL (ref 0.7–4.0)
Lymphs Abs: 3.7 10*3/uL (ref 0.7–4.0)
Monocytes Absolute: 0.9 10*3/uL (ref 0.1–1.0)
Monocytes Relative: 9 % (ref 3–12)
Neutro Abs: 6.8 10*3/uL (ref 1.7–7.7)
Neutrophils Relative %: 54 % (ref 43–77)

## 2010-10-08 LAB — BASIC METABOLIC PANEL
BUN: 6 mg/dL (ref 6–23)
CO2: 27 mEq/L (ref 19–32)
Chloride: 96 mEq/L (ref 96–112)
GFR calc Af Amer: >60 mL/min (ref >60)
GFR calc non Af Amer: >60 mL/min (ref >60)
Potassium: 4.2 mEq/L (ref 3.5–5.1)
Potassium: 4.2 mEq/L (ref 3.5–5.1)
Sodium: 130 mEq/L — ABNORMAL LOW (ref 135–145)

## 2010-11-04 ENCOUNTER — Other Ambulatory Visit: Payer: Self-pay | Admitting: *Deleted

## 2010-11-04 MED ORDER — ESOMEPRAZOLE MAGNESIUM 40 MG PO CPDR: 40.0000 mg | DELAYED_RELEASE_CAPSULE | Freq: Every day | ORAL | Status: DC

## 2010-11-05 NOTE — Discharge Summary (Signed)
[name], [name]                  ACCOUNT NO.:  [id number]   MEDICAL RECORD NO.:  [id number]          PATIENT TYPE:  INP   LOCATION:  1436                         FACILITY:  [facility]   PHYSICIAN:  Rosalyn Gess. Norins, MD  DATE OF BIRTH:  29-Mar-1924   DATE OF ADMISSION:  07/20/2008  DATE OF DISCHARGE:  07/26/2008                               DISCHARGE SUMMARY   ADMITTING DIAGNOSES:  1. Weakness.  2. Hyponatremia.  3. Right middle lobe pneumonia.  4. Hypokalemia.  5. Hypertension.  6. Gastroesophageal reflux disease.   DISCHARGE DIAGNOSES:  1. Right middle lobe pneumonia, improved.  2. Hyponatremia improved with sodium of 130 at discharge.  3. Weakness persistent.  4. Hypokalemia, resolved.  5. Hypertension, stable.  6. Gastroesophageal reflux disease, stable.   CONSULTANTS:  None.   PROCEDURES:  1. Chest x-ray at admission which showed patchy opacity in right      middle lobe suspicious for pneumonia.  2. CT angio chest date of admission which was negative for acute PE or      thoracic aortic dissection.  Coronary and aortic calcifications      noted.  Patchy bilateral airspace infiltrates new since prior study      suggesting multifocal pneumonia.  Large hiatal hernia.  3. Abdominal ultrasound performed January 28 showed cholelithiasis      without other ultrasound findings to suggest cholecystitis or      biliary obstruction.  Possible left nephrolithiasis.  Small      bilateral renal cyst.  4. Follow-up chest x-ray January 30 with mild worsening of right      middle lobe infiltrate.  5. Chest x-ray February 2 showing right middle lobe infiltrate without      change.   HISTORY OF PRESENT ILLNESS:  The patient is an 77 year old woman  generally in good health followed by Dr. Roxy Manns at [facility] with hypertension, mild chronic lower extremity edema.  The patient saw her physician 2 days prior to admission feeling ill with  cough, aching in her  chest and bilateral ribs associated with shortness  of breath and fever.  She was started on Biaxin, but had no improvement  in her shortness of breath or cough.  She had progressive weakness.  The  patient had increased swelling in her lower extremities despite doubling  her hydrochlorothiazide.  Labs done in the office showed sodium had been  dropped to 128; previously at 136.  The patient did have pain in her  ribs and chest.  Because of her persistent symptoms and weakness, she  was admitted to [facility] through the emergency department.   Please see the H and P for past medical history, current medications,  family history, and social history.   PHYSICAL EXAMINATION:  Significant for absence of fever, respiratory  rate of 18, O2 sat 95% on 2 liters, 90% on room air.  CHEST:  With increased work of breathing with use of accessory muscles  with rest.  LUNGS:  Did sound clear to auscultation, but had limited air movement.  There was no appreciable wheeze or crackle.  CARDIOVASCULAR:  Unremarkable.   ADMISSION LABORATORIES:  Sodium of 117, potassium 2.8, chloride 81,  bicarb 24, BUN 8, creatinine 0.6, glucose 117.  Liver functions were  normal.  WBC 15.8, hemoglobin 12.4, platelets 362.  BNP less than 30.  Initial cardiac enzymes point of care were negative with a troponin of  less than 0.5.   HOSPITAL COURSE:  1. Right middle lobe pneumonia.  The patient was treated with Rocephin      1 g IV q.24, azithromycin 500 mg IV q.24, oxygen for support.  She      had radiographic studies as noted.  The patient had good response      to this treatment.  She completed 3 days of azithromycin.  She was      converted from Rocephin to Ceftin.  She continued to do well with      no fevers.  Her white blood count returned to normal levels.      Oxygen saturation was good the day prior to discharge with 94% at      rest, 95% with exercise off oxygen.  Follow-up chest x-ray as noted       with no progressive disease.  With the patient's O2 sat being      normal range, with her white count being normal range, with her      chest x-ray showing no progression of disease, she is thought to be      stable and ready for discharge home to complete outpatient      antibiotic therapy.  2. Electrolyte abnormalities.  The patient probably with low sodium      secondary to both her pneumonia as well as diuretics.  She was      hydrated with IV normal saline.  Her pneumonia was treated.  She      had normalization of her potassium.  Sodium did markedly improve,      although still slightly low at time of discharge at 130.  Plan:      The patient is to follow a 1.5 liter fluid restriction at home.      She is to use her diuretics with care.  3. Hypertension.  The patient remained stable during her      hospitalization.  4. GERD.  This remained stable.  The patient's other medical problems      including history of obstructive sleep apnea, dyslipidemia were      stable.  Her level of strength and endurance remained below normal      but improved.  5. The patient did develop a rash on her back and lower extremities.      This was not thought to be drug-related due to the fact that it did      not involve her torso or abdomen, and did not have a classic      appearance of a drug rash.  She was treated with loratadine 10 mg      b.i.d., although pharmacy only administered it 10 mg daily and with      ranitidine 150 b.i.d.  She did have some improvement in her      discomfort, but still had pruritus at night.   DISCHARGE PHYSICAL EXAMINATION:  Temperature 97.8, blood pressure  142/73, heart rate 76, respirations 18, O2 sats 95% on room air.  GENERAL APPEARANCE:  This is a heavy-set Caucasian woman who looks her  stated age.  HEENT:  Was unremarkable.  NECK:  Supple.  CHEST:  The patient had increased AP diameter, very mild kyphosis.  LUNGS:  Patient is moving air well with no rales,  wheezes or rhonchi.  CARDIOVASCULAR:  2+ radial pulse.  She had a quiet precordium with a  regular rate and rhythm.  ABDOMEN:  Protuberant, soft.  There is no tenderness on exam.  No  guarding or rebound.  DERMATOLOGICAL:  The patient has an erythematous macular papular rash on  her back, upper extremities proximally, and lower extremities  proximally.  This seemed less erythematous than on February 2.  NEUROLOGIC:  Nonfocal.   FINAL LABORATORY:  CBC day of discharge with a hemoglobin of 12.4 g,  white count was 10,500 with 52% segs, 34% lymphs, 5% monos.  Chemistries  with sodium 130, potassium 4.2, chloride 96, CO2 of 27, BUN of 8,  creatinine 0.65, glucose 108.   DISPOSITION:  The patient is to be discharged home.  She will resume all  her home medications including:   1. Nexium 40 mg daily.  2. Diovan 160 mg daily.  3. Verapamil CR 240 mg daily.  4. HCTZ 25 mg once daily.  5. Potassium 10 mEq daily.  6. She will complete course of Ceftin 250 mg b.i.d. for an additional      5 days.  7. She will use loratadine 10 mg b.i.d. as long as she has pruritus.  8. Ranitidine 150 mg b.i.d. as long as she has pruritus.   The patient will follow up with Dr. Milinda Antis in approximately 1 week for  follow-up.  She is to call for any increased problems with breathing or  refractory pruritus.   CONDITION AT TIME OF DISCHARGE DICTATION:  Stable and improved.      Rosalyn Gess Norins, MD  Electronically Signed     MEN/MEDQ  D:  07/26/2008  T:  07/26/2008  Job:  [id number]   cc:   Marne A. Tower, MD  [address] [place], [place] [address]

## 2010-11-05 NOTE — Assessment & Plan Note (Signed)
[facility] OFFICE NOTE   NAME:[name], [name]                         MRN:          [id number]  DATE:12/06/2007                            DOB:          February 02, 1924    PRIMARY CARE PHYSICIAN:  Marne A. Tower, MD   REASON FOR CONSULTATION:  Evaluate the patient with shortness of breath.   HISTORY OF PRESENT ILLNESS:  The patient is a lovely 77 year old white  female with history of cardiac catheterization in 1980 for chest pain.  She has had a stress test, the last one probably in the early 2000s.  She has not had any diagnosis of coronary artery blockage.  She does  have cardiovascular risk factors, however.  She is being referred for  evaluation of progressive dyspnea.  This is happening with minimal  exertion now.  It has been slowly progressive.  She says walking across  her house, she will get somewhat dyspneic.  She has not described any  resting shortness of breath.  She is not having any PND or orthopnea.  She is not getting any palpitations, presyncope, or syncope.  She does  have increasing lower extremity swelling.  This seems to be less in the  morning and more towards the afternoon.  She has had blood pressure that  has been slightly elevated in the office recently and she is not sure at  home what it has been.   PAST MEDICAL HISTORY:  1. Hypertension x25 years.  2. Nephrolithiasis.  3. Sleep apnea (she has not tolerated CPAP).  4. Hiatal hernia with reflux.  5. Hyperlipidemia, recently diagnosed.  6. Allergic rhinitis.   PAST SURGICAL HISTORY:  Rectocele repair.   ALLERGIES:  CODEINE, LEVAQUIN, OXYTROL PATCH, and NEOSPORIN.   SOCIAL HISTORY:  The patient smoked many years ago.  She rarely drinks  alcohol.  She is retired.  She is married.  She has 5 children, many  grandchildren, and great-grandchildren.   FAMILY HISTORY:  Noncontributory for early coronary artery disease in  first-degree  relatives.   REVIEW OF SYSTEMS:  As stated in the HPI and  positive for vertigo  slightly, decreased hearing, and joint pains.  Negative for other  systems.   PHYSICAL EXAMINATION:  The patient is in no distress.  Blood pressure  164/96, heart rate 83 and regular, and weight 197 pounds.  HEENT:  Eyes are unremarkable.  Pupils are equal, round, and reactive to  light.  Fundi within normal limits.  Oral mucosa unremarkable.  NECK:  No jugular venous distention at 45 degrees.  Carotid upstrokes  are brisk and symmetrical.  No bruits.  No thyromegaly.  LYMPHATICS:  No cervical, axillary, or inguinal adenopathy.  LUNGS:  Clear to auscultation bilaterally.  BACK:  No costovertebral angle tenderness.  CHEST:  Unremarkable.  HEART:  PMI not displaced or sustained.  S1 and S2 within normal limits.  No S3.  No S4.  No clicks, rubs, or murmurs.  ABDOMEN:  Obese.  Positive  bowel sounds, normal in frequency and pitch.  No bruits, no rebound,  no  guarding, and no midline pulsatile mass.  No hepatomegaly.  No  splenomegaly.  SKIN:  No rashes.  No nodules.  EXTREMITIES:  Pulses 2+ throughout.  No edema, cyanosis, or clubbing.  NEURO:  Oriented to person, place, and time.  Cranial nerves II through  XII grossly intact.  Motor grossly intact.   EKG, sinus rhythm, rate 75, left axis deviation with left anterior  fascicular block, and poor anterior R-wave progression.  No acute ST-T  wave changes.   ASSESSMENT AND PLAN:  1. Dyspnea.  The patient did have a BNP level that was 28.  This would      argue against even diastolic dysfunction.  Certainly, would argue      against systolic dysfunction.  However, does not exclude      obstructive coronary disease as an etiology.  She has risk factors      for this.  The pretest probability is somewhat moderate.  She would      not be able to walk on a treadmill.  Therefore, she will need an      adenosine Cardiolite to rule out obstructive coronary disease.   If      this is negative, then I would not suggest further cardiovascular      testing, but would suggest that she train herself and lose weight      and probably could have improvement in her dyspnea.  We reviewed      this at great length.  2. Hypertension.  Blood pressure is slightly elevated.  She was just      started on hydrochlorothiazide.  I instructed her on getting a      blood pressure cuff and keeping a diary.  She will most likely need      an addition to her regimen.  Spironolactone might be a reasonable      choice for refractory hypertension and for her edema.  3. Sleep apnea.  The patient is unable to tolerate continuous positive      airway pressure.  4. Edema.  The patient does have some mild nonpitting lower extremity      edema.  This is probably multifactorial.  At this point, I would      not suggest treating her with Lasix, but would suggest conservative      therapies to include low salt and keeping her feet elevated.  She      does not want to wear compression stockings.  5. Follow up will be based on the results of her tests or future      symptoms.      Rollene Rotunda, MD, [facility]  Electronically Signed    JH/MedQ  DD: 12/06/2007  DT: 12/07/2007  Job #: [id number]   cc:   Marne A. Milinda Antis, MD

## 2010-11-05 NOTE — H&P (Signed)
[name], [name]                  ACCOUNT NO.:  [id number]   MEDICAL RECORD NO.:  [id number]          PATIENT TYPE:  EMS   LOCATION:  ED                           FACILITY:  [facility]   PHYSICIAN:  [name]. [name], MDDATE OF BIRTH:  [birth year]   DATE OF ADMISSION:  07/20/2008  DATE OF DISCHARGE:                              HISTORY & PHYSICAL   PRIMARY CARE PHYSICIAN:  Dr. Roxy Hudson.   CHIEF COMPLAINT:  Weakness, shortness of breath.   HPI:  Patient is an 77 year old white female in generally good health  with history of hypertension and mild chronic lower extremity edema who  was seen in the outpatient visit by her primary care physician 2 days  ago, July 18, 2008, due to feeling sick.  She started feeling sick  Sunday, July 16, 2008, with coughing, aching of her chest and  bilateral ribs associated with shortness of breath, also with fever at  that time.  She was given a prescription on July 18, 2008, for Biaxin  x10 days and reported that she had no improvement in her shortness of  breath or cough symptoms.  She was becoming progressively weaker in the  last 48 hours.  She also noticed increased lower extremity swelling  despite doubling her hydrochlorothiazide on July 10, 2008.  Labs done  at the office visit July 18, 2008, showed that the sodium dropped to  128 whereas it had been previously normal at 136 in June of 2009 so she  was advised to return to her original dose of hydrochlorothiazide 25 mg  daily.  Family reports that she was taking Lasix but upon further  clarification the family referred to Lasix as the name of her fluid  pill which is identified on review as being the hydrochlorothiazide  tablets.  Patient denies any sputum with her cough.  She has had no  hemoptysis.  The pain in her ribs and chest associated with her cough  has decreased over the last 4 days and is not a significant complaint of  this time.  They deny sick contacts at home.   She has had nausea for  over 2 weeks but no vomiting or diarrhea.  Her spouse is concerned that  in fact the patient is constipated and needs a laxative.  Patient  reports that food does not taste good but she tolerates p.o. well  including eggs this morning.  She has had no other over-the-counter  medications added to her regimen.  She has had no confusion.  No history  of heart failure or other liver problems.   PAST MEDICAL HISTORY:  1. Bronchitis as described above, began Biaxin 2 days ago.  2. Dyslipidemia, not currently on treatment.  3. Hypertension.  4. GERD with hiatal hernia.  5. Right middle lobe scarring with abnormal chest x-ray confirmed on      CT of the chest in June of 2009.  6. History of obstructive sleep apnea with reported intolerance CPAP.  7. Normal adenosine Cardiolite June of 2009 with EF of 70% at that  time.   CURRENT MEDICATIONS:  Include:  1. Nexium 40 mg daily.  2. Geodon 160 mg daily.  3. Verapamil 240 mg daily.  4. Hydrochlorothiazide 25 mg daily.  As noted above, was taking 50 mg      daily from July 10, 2008, through July 18, 2008.  5. Potassium 10 mEq daily, begun yesterday.  6. Biaxin 500 mg b.i.d. since July 18, 2008.   ALLERGIES:  INCLUDE:  1. CODEINE.  2. LEVAQUIN.  3. OXYTROL PATCH.   FAMILY HISTORY:  Mother died of stomach cancer.  Father died due to  heart disease.   SOCIAL HISTORY:  She is married.  She lives with her spouse.  She has  multiple children and many grandchildren.  Supportive family.  Remote  tobacco abuse in the past.  Occasional alcohol.   REVIEW OF SYSTEMS:  GENERAL:  Positive weak all over.  No fevers.  No  chills at this time.  EYES:  No vision changes.  ENT:  No sore throat.  CARDIOVASCULAR:  Chest pain 4 days ago, now resolved.  Positive lower  extremity swelling.  Chronic but increasing edema in the last 4 days.  RESPIRATORY:  See HPI above.  Positive for shortness of breath.  Positive for cough.   GU:  Positive history of kidney stones though  reportedly normal renal function at all times.  GI:  Positive for  nausea.  No vomiting.  No diarrhea.  Positive constipation.  No  abdominal pain.  Positive bloating.  Other systems reviewed and negative  except as listed.   PHYSICAL EXAM:  Temperature of 98.7.  Blood pressure 158/65.  Pulse of  94.  Respiratory rate 18.  Saturating 90% on room air, 95% on 2 L.  GENERAL:  She is a pleasant older woman who is dyspneic with  conversation but nontoxic appearing.  She does appear fatigued and  tired.  Two daughters and her spouse are at bedside.  She is in no acute  distress.  NECK:  Supple with full range of motion.  No lymphadenopathy,  thyromegaly, or nodules appreciated.  EYES:  PERRL.  EOMI.  No icterus or injection.  ENT:  Shows grossly normal hearing.  Oropharynx is dry and clear.  RESPIRATORY:  Shows increased work of breathing with use of accessory  muscles at rest though she has just returned from ambulating to the  restroom.  Lung sounds are clear to auscultation but limited air  movement.  No appreciable wheeze or crackle.  CARDIOVASCULAR:  Regular rate and rhythm.  No murmurs.  No rubs  appreciated.  Two-plus non-pitting edema without erythema bilaterally  from her feet to her ankles to her knees.  Question minimally left more  swollen than right.  ABDOMEN:  Protuberant, nontender, soft with good bowel sounds.  There  are no appreciable masses or hepatosplenomegaly.  NEURO:  She follows commands.  Cranial nerves II-XII are symmetrically  intact.  Her speech is fluent and appropriate.  She moves all  extremities spontaneously and gait is observed by myself as she  ambulated returning from the restroom into the room at the beginning of  our exam.  PSYCH:  Awake, alert, oriented x4.  Minimally anxious but pleasant and  appropriate.   LABORATORY DATA:  Showed a sodium of 117, potassium of 2.8, chloride of  81, normal bicarb of  24, normal BUN 8, normal creatinine 0.6, glucose of  117, alk phos of 164, total bili of 1.4, albumin of 2.8, other  LFTs are  normal.  CBC with white count 15.8, hemoglobin 12.4, platelets of 362.  BNP is less than 30.  Point of care myoglobin was slightly elevated at  393.  MB fraction normal at 2.3 and troponin undetectable at less than  0.5.  laboratory data from June 28, 2008, revealed a sodium of 128, a  potassium of 3.2, glucose of 125, and albumin of 3.3.  Further review  shows  sodium normal at 136 on December 10, 2007.  Two-view chest x-ray  questions right middle lobe opacity and airspace disease, no effusions  or edema.  Note, review of CT chest done in June of 2009 shows right  middle lobe scarring changes.   ASSESSMENT/PLAN:  1. Weakness, suspect primarily due to hyponatremia.  See problem next.      Physical therapy and occupational therapy when patient      symptomatically improved and sodium is corrected.  Also, questioned      underlying infection.  See details below.  2. Hyponatremia.  I suspect this is likely related to recent changes      in hydrochlorothiazide use prior to admission but it also was      associated with an increase in peripheral edema and clinical volume      overload.  We will stop hydrochlorothiazide at this time and give      gentle normal saline fluid at 70 mL an hour, along with intravenous      Lasix every 8 hours x3 doses to monitor response.  BMET scheduled      every 8 hours x3 for reevaluation and monitoring of creatinine at      this time.  We will also check urinalysis to rule out proteinuria,      especially with a decreasing albumin.  Also rule out underlying      urinary tract infection.  We will check a 2D echo to rule out right-      sided heart failure, noting the patient had a normal left      ventricular function by Cardiolite in June of 2009.  Patient's lab      also showed a slight elevation in her LFTs associated with nausea       so we will check an abdominal ultrasound to rule out hepatic      abnormality such that would lead to cirrhosis contributing to these      symptoms but she does not have apparent ascites.  We will plan to      recheck LFTs in the morning.  I will not send for urine sodium or      osmolality at this time as patient has already received IV Lasix by      the EDP for her volume overload with good response and copious      urine in her Foley catheter at this time.  Given the Lasix, as well      as hydrochlorothiazide just prior to admission, expect that her      urine sodium levels will not adequately represent underlying kidney      function.  3. Question right middle lobe pneumonia.  Patient with clinical      bronchitis symptoms of cough and shortness of breath, as well as      fever 4 days ago also associated with leukocytosis at this time so      we will continue empiric antibiotics but discontinue her Biaxin in      place of  Rocephin and azithromycin at this time.  We will recheck a      CT of the chest to further evaluate her history of right middle      lobe airspace disease and scarring noted on CT in June of 2009 but      we will add angio component to rule out pulmonary embolism as this      may be contributing to her current low oxygen saturations, cough,      increased swelling, and chest pain that is now resolved.  We will      treat with antibiotics as noted.  Prophylaxis dose deep venous      thrombosis heparin until angio results are known.  Continue      antitussives and O2 p.r.n.  4. Hypokalemia.  Also again suspect this is likely due to diuretic use      prior to admission of hydrochlorothiazide doubling.  We will need      to aggressively replete with ongoing Lasix use.  See orders for      p.o. and IV replacement.  5. Hypertension.  Obviously, as discussed, we will discontinue      hydrochlorothiazide at this time but plan okay to continue her ARB,      Diovan.  We will  hold her verapamil due to nausea and increased      edema and consider use of beta-blocker if blood pressure remains      elevated with ongoing diuresis.  6. Gastroesophageal reflux disease with hiatal hernia, continue home      proton pump inhibitor.  Please see orders for details.  Patient,      case, and labs were discussed with daughters, as well as patient at      bedside including plans for further testing as discussed here to      determine etiology.      Valerie A. Joann Coyer, MD  Electronically Signed     VAL/MEDQ  D:  07/20/2008  T:  07/20/2008  Job:  [id number]

## 2010-11-08 ENCOUNTER — Encounter: Payer: Self-pay | Admitting: Family Medicine

## 2010-11-11 ENCOUNTER — Ambulatory Visit: Payer: Self-pay | Admitting: Family Medicine

## 2010-11-12 ENCOUNTER — Ambulatory Visit (INDEPENDENT_AMBULATORY_CARE_PROVIDER_SITE_OTHER): Payer: Medicare HMO | Admitting: Family Medicine

## 2010-11-12 ENCOUNTER — Encounter: Payer: Self-pay | Admitting: Family Medicine

## 2010-11-12 VITALS — BP 118/78 | HR 70 | Temp 97.5°F | Wt 177.1 lb

## 2010-11-12 DIAGNOSIS — E785 Hyperlipidemia, unspecified: Secondary | ICD-10-CM

## 2010-11-12 DIAGNOSIS — Z79899 Other long term (current) drug therapy: Secondary | ICD-10-CM

## 2010-11-12 DIAGNOSIS — E875 Hyperkalemia: Secondary | ICD-10-CM

## 2010-11-12 DIAGNOSIS — I10 Essential (primary) hypertension: Secondary | ICD-10-CM

## 2010-11-12 DIAGNOSIS — R55 Syncope and collapse: Secondary | ICD-10-CM

## 2010-11-12 LAB — CBC WITH DIFFERENTIAL/PLATELET
Basophils Relative: 0.8 % (ref 0.0–3.0)
Eosinophils Relative: 3.4 % (ref 0.0–5.0)
MCV: 91.3 fl (ref 78.0–100.0)
Monocytes Absolute: 0.8 10*3/uL (ref 0.1–1.0)
Monocytes Relative: 8.5 % (ref 3.0–12.0)
Neutrophils Relative %: 49.7 % (ref 43.0–77.0)
Platelets: 321 10*3/uL (ref 150.0–400.0)
RBC: 4.52 Mil/uL (ref 3.87–5.11)
WBC: 9.9 10*3/uL (ref 4.5–10.5)

## 2010-11-12 LAB — COMPREHENSIVE METABOLIC PANEL
Albumin: 4.1 g/dL (ref 3.5–5.2)
Alkaline Phosphatase: 51 U/L (ref 39–117)
BUN: 18 mg/dL (ref 6–23)
CO2: 27 mEq/L (ref 19–32)
GFR: 62.44 mL/min (ref >60.00)
Glucose, Bld: 90 mg/dL (ref 70–99)
Potassium: 5.7 mEq/L — ABNORMAL HIGH (ref 3.5–5.1)
Total Bilirubin: 0.5 mg/dL (ref 0.3–1.2)

## 2010-11-12 LAB — TSH: TSH: 2.04 u[IU]/mL (ref 0.35–5.50)

## 2010-11-12 MED ORDER — SODIUM POLYSTYRENE SULFONATE 15 GM/60ML PO SUSP: 15.0000 g | Freq: Once | ORAL | Status: AC

## 2010-11-12 NOTE — Patient Instructions (Addendum)
You should get a call from [facility] about your referral.  Don't change your meds in the meantime.  Take care.

## 2010-11-12 NOTE — Assessment & Plan Note (Signed)
I called the patient.  She'll stop the spironolactone and will take 1 dose of kayexelate that I called into [facility].  She'll call back to set up repeat K level on Thursday at lab visit.  She understood and agreed.

## 2010-11-12 NOTE — Assessment & Plan Note (Addendum)
EKG reassuring and reviewed.   No full syncope.  These may be vagal episodes.  Refer to cards for consideration of outpatient monitoring.  App cards help.  Okay for outpatient fu.  If CP, to ER.  D/w pt about her living situation.  She has supportive family who have offered for her and her husband to move in.  This is reasonable to consider.  They will discuss as a family.  >25 min spent with face to face with patient.

## 2010-11-13 ENCOUNTER — Encounter: Payer: Self-pay | Admitting: Family Medicine

## 2010-11-13 NOTE — Progress Notes (Signed)
Several episodes in past year when patient was upright and went to ground.  She was presyncopal, but didn't have full LOC.  No CP.  The episode would pass on its own and she would 'feel washed out' for the rest of the day.  No orthostatic/positional triggers.  No recent changes in meds to cause the event.  Last event was about 1 week ago.  No CP/SOB now.  No presyncopal feeling now.  Compliant with meds.   H/o PNA and is caring for husband who had mult medical problems.   Prev with neg cath at [facility].   Meds, vitals, and allergies reviewed.   ROS: See HPI.  Otherwise, noncontributory.  GEN: nad, alert and oriented HEENT: mucous membranes moist NECK: supple w/o LA CV: rrr PULM: ctab, no inc wob ABD: soft, +bs EXT: trace edema SKIN: no acute rash CN 2-12 wnl B, S/S wnl.    EKG reviewed.

## 2010-11-14 ENCOUNTER — Other Ambulatory Visit (INDEPENDENT_AMBULATORY_CARE_PROVIDER_SITE_OTHER): Payer: Medicare HMO

## 2010-11-14 DIAGNOSIS — E875 Hyperkalemia: Secondary | ICD-10-CM

## 2010-11-14 LAB — POTASSIUM: Potassium: 4.2 mEq/L (ref 3.5–5.1)

## 2010-11-22 ENCOUNTER — Encounter: Payer: Self-pay | Admitting: Cardiovascular Disease

## 2010-11-22 ENCOUNTER — Ambulatory Visit (INDEPENDENT_AMBULATORY_CARE_PROVIDER_SITE_OTHER): Payer: Medicare HMO | Admitting: Cardiovascular Disease

## 2010-11-22 DIAGNOSIS — I10 Essential (primary) hypertension: Secondary | ICD-10-CM

## 2010-11-22 DIAGNOSIS — G4730 Sleep apnea, unspecified: Secondary | ICD-10-CM

## 2010-11-22 DIAGNOSIS — E785 Hyperlipidemia, unspecified: Secondary | ICD-10-CM

## 2010-11-22 DIAGNOSIS — R609 Edema, unspecified: Secondary | ICD-10-CM

## 2010-11-22 DIAGNOSIS — Z79899 Other long term (current) drug therapy: Secondary | ICD-10-CM

## 2010-11-22 MED ORDER — HYDROCHLOROTHIAZIDE 12.5 MG PO CAPS: 12.5000 mg | ORAL_CAPSULE | Freq: Every day | ORAL | Status: DC

## 2010-11-22 NOTE — Assessment & Plan Note (Signed)
Refuses to wear CPAP  F/U primary

## 2010-11-22 NOTE — Progress Notes (Signed)
Several episodes in past year when patient was upright and went to ground. She was presyncopal, but didn't have full LOC. No CP. The episode would pass on its own and she would 'feel washed out' for the rest of the day. No orthostatic/positional triggers. No recent changes in meds to cause the event. Last event was about 1 week ago. No CP/SOB now. No presyncopal feeling now. Compliant with meds. H/o PNA and is caring for husband who had mult medical problems. Prev with neg cath at [facility].   Saw Dr Antoine Poche in 2009 for dyspnea.with normal BNP and normal ECHO.  Episodes sound like simple faints.  No palpitatons, SSCP.  Has not hurt herself.  Most episodes in the am.  Not related to meds and no documented bradycardia.  K was high on aldactone and has been held last 7-10 days but her LE edema is worse.  Looking forward to a trip to [place].  ROS: Denies fever, malais, weight loss, blurry vision, decreased visual acuity, cough, sputum, SOB, hemoptysis, pleuritic pain, palpitaitons, heartburn, abdominal pain, melena, lower extremity edema, claudication, or rash.  All other systems reviewed and negative   General: Affect appropriate Healthy:  appears stated age HEENT: normal Neck supple with no adenopathy JVP normal no bruits no thyromegaly Lungs clear with no wheezing and good diaphragmatic motion Heart:  S1/S2 no murmur,rub, gallop or click PMI normal Abdomen: benighn, BS positve, no tenderness, no AAA no bruit.  No HSM or HJR Distal pulses intact with no bruits Plus one bilateral  edema Neuro non-focal Skin warm and dry No muscular weakness  Medications Current Outpatient Prescriptions  Medication Sig Dispense Refill  . acetaminophen (TYLENOL) 500 MG tablet OTC as directed       . cholecalciferol (VITAMIN D) 1000 UNITS tablet Take 1,000 Units by mouth daily.        Marland Kitchen esomeprazole (NEXIUM) 40 MG capsule Take 1 capsule (40 mg total) by mouth daily before breakfast.  30 capsule  3  .  valsartan (DIOVAN) 160 MG tablet Take 1 tablet (160 mg total) by mouth daily.  30 tablet  6  . verapamil (CALAN-SR) 240 MG CR tablet Take 240 mg by mouth daily.        Marland Kitchen DISCONTD: Nutritional Supplements (JUICE PLUS FIBRE) LIQD OTC as directed       . DISCONTD: zolpidem (AMBIEN) 5 MG tablet Take 5 mg by mouth at bedtime as needed.          Allergies Codeine; Levofloxacin; Neospect; Spironolactone; and Triple antibiotic  Family History: Family History  Problem Relation Age of Onset  . Cancer Mother     stomach  . Coronary artery disease Father     Social History: History   Social History  . Marital Status: Married    Spouse Name: N/A    Number of Children: N/A  . Years of Education: N/A   Occupational History  . Not on file.   Social History Main Topics  . Smoking status: Never Smoker   . Smokeless tobacco: Not on file  . Alcohol Use: Yes  . Drug Use:   . Sexually Active:    Other Topics Concern  . Not on file   Social History Narrative   Married-husband with coronary artery disease and valve diseaseHas grand children and great grandchildren    Electrocardiogram:  NSR 63 normal ECG 11/12/10  Assessment and Plan

## 2010-11-22 NOTE — Assessment & Plan Note (Signed)
Cholesterol is at goal.  Continue current dose of statin and diet Rx.  No myalgias or side effects.  F/U  LFT's in 6 months. Lab Results  Component Value Date   LDLCALC 88 08/28/2009

## 2010-11-22 NOTE — Assessment & Plan Note (Signed)
Resume HCTZ and F/U K in 3 weeks on return from [facility]

## 2010-11-22 NOTE — Patient Instructions (Signed)
Your physician recommends that you schedule a follow-up appointment in: AS NEEDED  Your physician recommends that you continue on your current medications as directed. Please refer to the Current Medication list given to you today.  Your physician recommends that you return for lab work in: 3 WEEKS BMET  V58.69

## 2010-11-22 NOTE — Assessment & Plan Note (Signed)
Well controlled.  Continue current medications and low sodium Dash type diet.   Should improve with diuretic back

## 2010-11-28 ENCOUNTER — Telehealth: Payer: Self-pay | Admitting: Cardiovascular Disease

## 2010-11-28 NOTE — Telephone Encounter (Signed)
Spoke with pt, her aldactone was stopped due to elevated potassium and dr Eden Emms started her on HCTZ everyother day. She is in [place] visiting her dtr and she has swelling in her feet and ankles. She is not SOB. She will increase HCTZ to daily, she will also increase her potassium intake. She will call the first of the week if edema cont [name]

## 2010-11-28 NOTE — Telephone Encounter (Signed)
Since pt was put on new meds she is swelling and she wants to know if she can take it everyday instead of every other day

## 2010-11-28 NOTE — Telephone Encounter (Signed)
Left message for pt to call back Edna Grover  

## 2010-12-04 ENCOUNTER — Telehealth: Payer: Self-pay | Admitting: Cardiovascular Disease

## 2010-12-04 NOTE — Telephone Encounter (Signed)
Has question regarding her fluid meds. Pt still swelling out of town in [place] . pls advise.

## 2010-12-04 NOTE — Telephone Encounter (Signed)
Spoke with pt dtr, pt is out of town, she has been taking her HCTZ daily and cont to have some swelling in one leg. She questioned if could increase HCTZ. Pt instructed to watch her salt, elevate leg and see if improves. She will call back if cont to have problems [name]

## 2010-12-12 ENCOUNTER — Encounter: Payer: Self-pay | Admitting: Cardiovascular Disease

## 2010-12-16 ENCOUNTER — Other Ambulatory Visit: Payer: Medicare HMO | Admitting: *Deleted

## 2011-01-28 ENCOUNTER — Other Ambulatory Visit: Payer: Self-pay

## 2011-01-28 MED ORDER — VERAPAMIL HCL 240 MG PO TBCR: 240.0000 mg | EXTENDED_RELEASE_TABLET | Freq: Every day | ORAL | Status: DC

## 2011-01-28 NOTE — Telephone Encounter (Signed)
[facility] faxed refill request Verapamil HCL SR 240mg  #90 x0. Pt needs to call for appt.

## 2011-04-18 ENCOUNTER — Telehealth: Payer: Self-pay | Admitting: Family Medicine

## 2011-04-18 NOTE — Telephone Encounter (Signed)
That is fine with me- go ahead and switch , and please change the pcp name in chart because I cannot do that..Thanks

## 2011-04-18 NOTE — Telephone Encounter (Signed)
Patient would like to switch from Dr.Tower to you.  She's been unable to get an appointment with Dr.Tower when she calls to schedule an appointment and she said she's seen more of you than Dr.Tower in the past year. Patient would like to schedule a wellness exam with you.  Please advise.

## 2011-04-18 NOTE — Telephone Encounter (Signed)
Patient notified as instructed by telephone. PCP changed as instructed and [name] is scheduling pt's appt with Dr Para March.

## 2011-04-18 NOTE — Telephone Encounter (Signed)
Okay with me if okay with Tower.  If change is made, please schedule with me and change PCP label. Thanks.  Will forward to [employer].

## 2011-04-29 ENCOUNTER — Other Ambulatory Visit: Payer: Self-pay | Admitting: *Deleted

## 2011-04-29 MED ORDER — VALSARTAN 160 MG PO TABS: 160.0000 mg | ORAL_TABLET | Freq: Every day | ORAL | Status: DC

## 2011-04-29 NOTE — Telephone Encounter (Signed)
Received faxed refill request from pharmacy Refill sent electronically to pharmacy. 

## 2011-05-02 ENCOUNTER — Other Ambulatory Visit: Payer: Self-pay | Admitting: Family Medicine

## 2011-05-02 DIAGNOSIS — I10 Essential (primary) hypertension: Secondary | ICD-10-CM

## 2011-05-05 ENCOUNTER — Other Ambulatory Visit: Payer: Self-pay

## 2011-05-05 MED ORDER — VERAPAMIL HCL 240 MG PO TBCR: 240.0000 mg | EXTENDED_RELEASE_TABLET | Freq: Every day | ORAL | Status: DC

## 2011-05-05 NOTE — Telephone Encounter (Signed)
[facility] faxed refill request Verapamil SR 240 mg # 90 x 0. Pt already scheduled CPX with Dr Para March 05/16/11.

## 2011-05-09 ENCOUNTER — Other Ambulatory Visit (INDEPENDENT_AMBULATORY_CARE_PROVIDER_SITE_OTHER): Payer: Medicare HMO

## 2011-05-09 DIAGNOSIS — Z79899 Other long term (current) drug therapy: Secondary | ICD-10-CM

## 2011-05-09 DIAGNOSIS — I10 Essential (primary) hypertension: Secondary | ICD-10-CM

## 2011-05-09 LAB — BASIC METABOLIC PANEL
CO2: 30 mEq/L (ref 19–32)
Calcium: 9.3 mg/dL (ref 8.4–10.5)
Glucose, Bld: 94 mg/dL (ref 70–99)
Sodium: 140 mEq/L (ref 135–145)

## 2011-05-09 LAB — COMPREHENSIVE METABOLIC PANEL
ALT: 15 U/L (ref 0–35)
AST: 19 U/L (ref 0–37)
Alkaline Phosphatase: 61 U/L (ref 39–117)
Sodium: 140 mEq/L (ref 135–145)
Total Bilirubin: 0.8 mg/dL (ref 0.3–1.2)
Total Protein: 7.3 g/dL (ref 6.0–8.3)

## 2011-05-09 LAB — LIPID PANEL
HDL: 60.9 mg/dL (ref >39.00)
LDL Cholesterol: 102 mg/dL — ABNORMAL HIGH (ref 0–99)
Total CHOL/HDL Ratio: 3
VLDL: 20.6 mg/dL (ref 0.0–40.0)

## 2011-05-16 ENCOUNTER — Ambulatory Visit (INDEPENDENT_AMBULATORY_CARE_PROVIDER_SITE_OTHER): Payer: Medicare HMO | Admitting: Family Medicine

## 2011-05-16 ENCOUNTER — Encounter: Payer: Self-pay | Admitting: Family Medicine

## 2011-05-16 VITALS — BP 138/80 | HR 56 | Temp 98.2°F | Ht 61.5 in | Wt 187.0 lb

## 2011-05-16 DIAGNOSIS — I10 Essential (primary) hypertension: Secondary | ICD-10-CM

## 2011-05-16 DIAGNOSIS — Z780 Asymptomatic menopausal state: Secondary | ICD-10-CM

## 2011-05-16 DIAGNOSIS — E785 Hyperlipidemia, unspecified: Secondary | ICD-10-CM

## 2011-05-16 DIAGNOSIS — Z9189 Other specified personal risk factors, not elsewhere classified: Secondary | ICD-10-CM

## 2011-05-16 DIAGNOSIS — G4730 Sleep apnea, unspecified: Secondary | ICD-10-CM

## 2011-05-16 DIAGNOSIS — Z7189 Other specified counseling: Secondary | ICD-10-CM

## 2011-05-16 DIAGNOSIS — Z13820 Encounter for screening for osteoporosis: Secondary | ICD-10-CM

## 2011-05-16 DIAGNOSIS — Z1231 Encounter for screening mammogram for malignant neoplasm of breast: Secondary | ICD-10-CM

## 2011-05-16 DIAGNOSIS — R609 Edema, unspecified: Secondary | ICD-10-CM

## 2011-05-16 DIAGNOSIS — Z9289 Personal history of other medical treatment: Secondary | ICD-10-CM

## 2011-05-16 NOTE — Progress Notes (Signed)
Moved into [facility] and this has been a source of upheaval/stress.    Hypertension:    Using medication without problems or lightheadedness: yes Chest pain with exertion:no Edema: occ Short of breath: occ with exertion Other issues: as below.  Advance directive.  We talked about this.  She doesn't have formal papers but she wouldn't want a feeding tube placed.  I encouraged her to look into living wills.   OSA.  Not using mask but not having sig sx (and not having as much AM fatigue).  Snoring is better now.    Previous episodes of syncope.  No more episodes.  Off spironolactone in meantime.  She's had more edema since starting HCTZ.  Taking it every other day helped with initial tolerance and then now back on qd dosing.    Breast exams done by patient.  Due for mammogram.  No masses, pain, discharge.    Meds, vitals, and allergies reviewed.   PMH and SH reviewed  ROS: See HPI.  Otherwise negative.    GEN: nad, alert and oriented HEENT: mucous membranes moist NECK: supple w/o LA CV: rrr. PULM: ctab, no inc wob ABD: soft, +bs EXT: no edema SKIN: no acute rash

## 2011-05-16 NOTE — Patient Instructions (Addendum)
Check with your insurance to see if they will cover the shingles shot. See Shirlee Limerick about your referral before you leave today. Take care and let me know if you have concerns.

## 2011-05-18 ENCOUNTER — Encounter: Payer: Self-pay | Admitting: Family Medicine

## 2011-05-18 DIAGNOSIS — Z13820 Encounter for screening for osteoporosis: Secondary | ICD-10-CM | POA: Insufficient documentation

## 2011-05-18 DIAGNOSIS — Z9289 Personal history of other medical treatment: Secondary | ICD-10-CM | POA: Insufficient documentation

## 2011-05-18 DIAGNOSIS — Z7189 Other specified counseling: Secondary | ICD-10-CM | POA: Insufficient documentation

## 2011-05-18 NOTE — Assessment & Plan Note (Signed)
Sx improved per pt.

## 2011-05-18 NOTE — Assessment & Plan Note (Signed)
She'll call about scheduling.

## 2011-05-18 NOTE — Assessment & Plan Note (Signed)
She'll look into formal papers.

## 2011-05-18 NOTE — Assessment & Plan Note (Signed)
No edema on exam today, no change in meds.

## 2011-05-18 NOTE — Assessment & Plan Note (Signed)
Continue diet and exercise. 

## 2011-05-18 NOTE — Assessment & Plan Note (Signed)
Controlled , no change in meds 

## 2011-05-18 NOTE — Assessment & Plan Note (Signed)
Refer for dexa

## 2011-05-26 ENCOUNTER — Other Ambulatory Visit: Payer: Self-pay | Admitting: *Deleted

## 2011-05-26 MED ORDER — ESOMEPRAZOLE MAGNESIUM 40 MG PO CPDR: 40.0000 mg | DELAYED_RELEASE_CAPSULE | Freq: Every day | ORAL | Status: DC

## 2011-06-13 ENCOUNTER — Ambulatory Visit: Payer: Medicare HMO

## 2011-06-13 ENCOUNTER — Other Ambulatory Visit: Payer: Medicare HMO

## 2011-06-23 ENCOUNTER — Other Ambulatory Visit: Payer: Self-pay | Admitting: *Deleted

## 2011-06-23 MED ORDER — VALSARTAN 160 MG PO TABS: 160.0000 mg | ORAL_TABLET | Freq: Every day | ORAL | Status: DC

## 2011-07-30 ENCOUNTER — Other Ambulatory Visit: Payer: Self-pay | Admitting: *Deleted

## 2011-07-30 MED ORDER — VERAPAMIL HCL ER 240 MG PO TBCR: 240.0000 mg | EXTENDED_RELEASE_TABLET | Freq: Every day | ORAL | Status: DC

## 2011-08-19 ENCOUNTER — Other Ambulatory Visit: Payer: Self-pay | Admitting: Family Medicine

## 2011-08-19 MED ORDER — ZOSTER VACCINE LIVE 19400 UNT/0.65ML ~~LOC~~ SOLR: 0.6500 mL | Freq: Once | SUBCUTANEOUS | Status: AC

## 2011-08-19 NOTE — Progress Notes (Signed)
Requested by patient

## 2011-11-03 ENCOUNTER — Other Ambulatory Visit: Payer: Self-pay | Admitting: *Deleted

## 2011-11-03 MED ORDER — VERAPAMIL HCL ER 240 MG PO TBCR: 240.0000 mg | EXTENDED_RELEASE_TABLET | Freq: Every day | ORAL | Status: DC

## 2011-11-21 ENCOUNTER — Other Ambulatory Visit: Payer: Self-pay | Admitting: Family Medicine

## 2011-11-21 NOTE — Progress Notes (Signed)
I d/w pt.  She isn't a candidate for the zostavax given her allergy profile.

## 2011-12-03 ENCOUNTER — Telehealth: Payer: Self-pay | Admitting: Family Medicine

## 2011-12-03 DIAGNOSIS — R609 Edema, unspecified: Secondary | ICD-10-CM

## 2011-12-03 MED ORDER — HYDROCHLOROTHIAZIDE 12.5 MG PO CAPS: 12.5000 mg | ORAL_CAPSULE | Freq: Every day | ORAL | Status: DC

## 2011-12-03 NOTE — Telephone Encounter (Signed)
Sent!

## 2011-12-03 NOTE — Telephone Encounter (Signed)
Pt is needing refill for Hydrochloroth-iazide 12.5 mg and she uses [facility]

## 2011-12-26 ENCOUNTER — Other Ambulatory Visit: Payer: Self-pay | Admitting: *Deleted

## 2011-12-26 MED ORDER — ESOMEPRAZOLE MAGNESIUM 40 MG PO CPDR: 40.0000 mg | DELAYED_RELEASE_CAPSULE | Freq: Every day | ORAL | Status: DC

## 2012-02-02 ENCOUNTER — Ambulatory Visit: Payer: Medicare HMO | Admitting: Family Medicine

## 2012-02-05 ENCOUNTER — Encounter: Payer: Self-pay | Admitting: Family Medicine

## 2012-02-05 ENCOUNTER — Ambulatory Visit (INDEPENDENT_AMBULATORY_CARE_PROVIDER_SITE_OTHER): Payer: Medicare HMO | Admitting: Family Medicine

## 2012-02-05 VITALS — BP 126/80 | HR 68 | Temp 98.3°F | Wt 186.8 lb

## 2012-02-05 DIAGNOSIS — L989 Disorder of the skin and subcutaneous tissue, unspecified: Secondary | ICD-10-CM

## 2012-02-05 DIAGNOSIS — R0789 Other chest pain: Secondary | ICD-10-CM

## 2012-02-05 DIAGNOSIS — R071 Chest pain on breathing: Secondary | ICD-10-CM

## 2012-02-05 MED ORDER — TRIAMCINOLONE ACETONIDE 0.1 % EX CREA: TOPICAL_CREAM | Freq: Two times a day (BID) | CUTANEOUS | Status: AC | PRN

## 2012-02-05 NOTE — Progress Notes (Signed)
Had eval at derm clinic prev without clear dx.  Lesion on legs will come and then fade away.  Now with lesion on posterior R distal lower leg, is sore irritated and itchy  She was asking if this could be related to diovan.    She had an episode where she took a bath but had trouble getting out of the tub.  Had to pull and strain a lot to get out.  No CP at the time. Afterward, had diffuse L sided back and chest pain, worse with movement that lasted a few days.  Improving now.  No CP unless she is moving, lifting.  No rash.    Meds, vitals, and allergies reviewed.   ROS: See HPI.  Otherwise, noncontributory.  nad ncat rrr ctab Chest wall not ttp anteriorly but L upper back paraspinal muscles ttp, no local rash nonulcerated excoriated lesions noted, 2 on L leg and 1 on R, none appear infected.

## 2012-02-05 NOTE — Patient Instructions (Addendum)
Use the cream on the itchy areas.  Take care.

## 2012-02-06 DIAGNOSIS — R0789 Other chest pain: Secondary | ICD-10-CM | POA: Insufficient documentation

## 2012-02-06 DIAGNOSIS — L989 Disorder of the skin and subcutaneous tissue, unspecified: Secondary | ICD-10-CM | POA: Insufficient documentation

## 2012-02-06 NOTE — Assessment & Plan Note (Signed)
Unclear source, not c/w vasculitis (that was her concern with diovan).  Continue BP meds.  Would use TAC for the itching to try to break the itch scratch cycle and allow for healing. F/u prn.

## 2012-02-06 NOTE — Assessment & Plan Note (Signed)
Likely muscular chest pain after straining to get out of the tub.  Much improved now. Should resolve.

## 2012-07-12 ENCOUNTER — Other Ambulatory Visit: Payer: Self-pay

## 2012-07-12 MED ORDER — ESOMEPRAZOLE MAGNESIUM 40 MG PO CPDR: 40.0000 mg | DELAYED_RELEASE_CAPSULE | Freq: Every day | ORAL | Status: DC

## 2012-07-12 NOTE — Telephone Encounter (Signed)
[facility] faxed refill nexium;#30 x 0 with note pt needs to call for appt.

## 2012-07-13 ENCOUNTER — Other Ambulatory Visit: Payer: Self-pay | Admitting: *Deleted

## 2012-07-13 ENCOUNTER — Encounter: Payer: Self-pay | Admitting: Family Medicine

## 2012-07-13 ENCOUNTER — Ambulatory Visit (INDEPENDENT_AMBULATORY_CARE_PROVIDER_SITE_OTHER): Payer: Medicare HMO | Admitting: Family Medicine

## 2012-07-13 VITALS — BP 132/76 | HR 78 | Temp 97.8°F | Wt 182.0 lb

## 2012-07-13 DIAGNOSIS — I10 Essential (primary) hypertension: Secondary | ICD-10-CM

## 2012-07-13 LAB — COMPREHENSIVE METABOLIC PANEL
AST: 15 U/L (ref 0–37)
Albumin: 4.2 g/dL (ref 3.5–5.2)
BUN: 14 mg/dL (ref 6–23)
CO2: 27 mEq/L (ref 19–32)
Calcium: 9.8 mg/dL (ref 8.4–10.5)
Chloride: 100 mEq/L (ref 96–112)
Creatinine, Ser: 0.8 mg/dL (ref 0.4–1.2)
GFR: 73.21 mL/min (ref >60.00)
Potassium: 4 mEq/L (ref 3.5–5.1)

## 2012-07-13 MED ORDER — VALSARTAN 160 MG PO TABS: 160.0000 mg | ORAL_TABLET | Freq: Every day | ORAL | Status: DC

## 2012-07-13 NOTE — Patient Instructions (Addendum)
Call about the BP medicine (dose and name) if we need to change it.  Go to the lab on the way out.  We'll contact you with your lab report.  Take care.

## 2012-07-13 NOTE — Assessment & Plan Note (Signed)
She may have insurance coverage issues with some of her meds.  She'll notify me.  Continue as is for now. BP okay.  She is doing very well considering her home situation.  Hospice has a been a great help to her.  See notes on labs.

## 2012-07-13 NOTE — Progress Notes (Signed)
Hypertension:    Using medication without problems or lightheadedness: yes Chest pain with exertion:no Edema:occ transient Short of breath: this is minimal and age-expected per patient  She is continuing to care for her husband, he has cardiac disease and more memory troubles.  They put rails on the bed and he hasn't fallen out of the bed.  He is on hospice currently.    Meds, vitals, and allergies reviewed.   ROS: See HPI.  Otherwise negative.    GEN: nad, alert and oriented HEENT: mucous membranes moist NECK: supple w/o LA CV: rrr. PULM: ctab, no inc wob ABD: soft, +bs EXT: no edema SKIN: no acute rash

## 2012-08-25 ENCOUNTER — Telehealth: Payer: Self-pay

## 2012-08-25 NOTE — Telephone Encounter (Signed)
[facility] faxed prior auth for Diovan 160 mg; called [phone] spoke with Elmarie Shiley (since pt is out of med Elmarie Shiley will expedite and we should have answer to PA in 24 hours.)

## 2012-08-26 ENCOUNTER — Other Ambulatory Visit: Payer: Self-pay | Admitting: *Deleted

## 2012-08-26 MED ORDER — LOSARTAN POTASSIUM 50 MG PO TABS: 50.0000 mg | ORAL_TABLET | Freq: Every day | ORAL | Status: DC

## 2012-08-26 NOTE — Telephone Encounter (Signed)
Would change to losartan 50mg  a day. Please call in #[phone] and 3rf and take diovan off the med list.  Thanks.

## 2012-08-26 NOTE — Telephone Encounter (Signed)
Med sent to pharmacy.  Med list updated.

## 2012-08-26 NOTE — Telephone Encounter (Signed)
Nick,pharmacist with [employer] said Diovan is not on approved list for pt; has pt tried brand Benicar, brand Atacand, generic for Atacand, and losartan. Is there a reason pt could not try one of the approved meds. Call Weston Brass at [phone]. Since expedited request needs call back before 1 pm today before prior auth denied. Spoke with pt and she cannot remember taking any med except Diovan; pt said has been on Diovan for years.Please advise.

## 2012-09-21 ENCOUNTER — Other Ambulatory Visit: Payer: Self-pay | Admitting: *Deleted

## 2012-09-21 MED ORDER — ESOMEPRAZOLE MAGNESIUM 40 MG PO CPDR: 40.0000 mg | DELAYED_RELEASE_CAPSULE | Freq: Every day | ORAL | Status: DC

## 2012-10-19 ENCOUNTER — Other Ambulatory Visit: Payer: Self-pay | Admitting: *Deleted

## 2012-10-19 MED ORDER — VERAPAMIL HCL ER 240 MG PO TBCR: 240.0000 mg | EXTENDED_RELEASE_TABLET | Freq: Every day | ORAL | Status: DC

## 2012-12-02 ENCOUNTER — Ambulatory Visit (INDEPENDENT_AMBULATORY_CARE_PROVIDER_SITE_OTHER): Payer: Medicare HMO | Admitting: Family Medicine

## 2012-12-02 ENCOUNTER — Encounter: Payer: Self-pay | Admitting: Family Medicine

## 2012-12-02 ENCOUNTER — Other Ambulatory Visit: Payer: Self-pay | Admitting: Family Medicine

## 2012-12-02 VITALS — BP 142/80 | HR 78 | Temp 97.9°F | Wt 179.5 lb

## 2012-12-02 DIAGNOSIS — I10 Essential (primary) hypertension: Secondary | ICD-10-CM

## 2012-12-02 DIAGNOSIS — N63 Unspecified lump in unspecified breast: Secondary | ICD-10-CM

## 2012-12-02 DIAGNOSIS — Z1231 Encounter for screening mammogram for malignant neoplasm of breast: Secondary | ICD-10-CM

## 2012-12-02 DIAGNOSIS — R609 Edema, unspecified: Secondary | ICD-10-CM

## 2012-12-02 MED ORDER — HYDROCHLOROTHIAZIDE 12.5 MG PO CAPS: 12.5000 mg | ORAL_CAPSULE | Freq: Every day | ORAL | Status: DC

## 2012-12-02 MED ORDER — VERAPAMIL HCL ER 240 MG PO TBCR: 240.0000 mg | EXTENDED_RELEASE_TABLET | Freq: Every day | ORAL | Status: DC

## 2012-12-02 MED ORDER — LOSARTAN POTASSIUM 50 MG PO TABS: 50.0000 mg | ORAL_TABLET | Freq: Every day | ORAL | Status: DC

## 2012-12-02 NOTE — Progress Notes (Signed)
Her husband has been on hospice (this has been helpful) and this is a constant strain for her.  Some of her kids are out of town and others are less available.  She had a beloved niece die at a family member's wedding recently.  She is tolerating the situation and trying to work through all of this.   Hypertension:    Using medication without problems or lightheadedness: yes Chest pain with exertion: no Edema: occ in afternoon, self resolves Short of breath: no  Sore lesion in L breast noted recently.    Meds, vitals, and allergies reviewed.   PMH and SH reviewed  ROS: See HPI.  Otherwise negative.    GEN: nad, alert and oriented HEENT: mucous membranes moist NECK: supple w/o LA CV: rrr. PULM: ctab, no inc wob ABD: soft, +bs EXT: no edema SKIN: no acute rash Chaperoned exam with hard and tender lesion just inferior to the central portion of the L nipple.

## 2012-12-02 NOTE — Assessment & Plan Note (Signed)
Refer for mammogram.  

## 2012-12-02 NOTE — Patient Instructions (Addendum)
See Shirlee Limerick about your referral before you leave today. Take care. I wouldn't change your meds for now.

## 2012-12-02 NOTE — Assessment & Plan Note (Signed)
Controlled, continue current meds.  No change.  She agrees.

## 2012-12-15 ENCOUNTER — Ambulatory Visit
Admission: RE | Admit: 2012-12-15 | Discharge: 2012-12-15 | Disposition: A | Discharge status: Home / Self Care | Payer: Medicare HMO | Source: Ambulatory Visit | Attending: Family Medicine | Admitting: Family Medicine

## 2012-12-15 DIAGNOSIS — N63 Unspecified lump in unspecified breast: Secondary | ICD-10-CM

## 2013-01-11 ENCOUNTER — Encounter: Payer: Self-pay | Admitting: Family Medicine

## 2013-02-23 ENCOUNTER — Ambulatory Visit (INDEPENDENT_AMBULATORY_CARE_PROVIDER_SITE_OTHER): Payer: Medicare HMO | Admitting: Family Medicine

## 2013-02-23 ENCOUNTER — Encounter: Payer: Self-pay | Admitting: Family Medicine

## 2013-02-23 VITALS — BP 130/80 | HR 67 | Temp 98.0°F | Wt 176.0 lb

## 2013-02-23 DIAGNOSIS — I10 Essential (primary) hypertension: Secondary | ICD-10-CM

## 2013-02-23 DIAGNOSIS — Z23 Encounter for immunization: Secondary | ICD-10-CM

## 2013-02-23 DIAGNOSIS — R609 Edema, unspecified: Secondary | ICD-10-CM

## 2013-02-23 DIAGNOSIS — M25569 Pain in unspecified knee: Secondary | ICD-10-CM

## 2013-02-23 MED ORDER — VERAPAMIL HCL ER 240 MG PO TBCR: 240.0000 mg | EXTENDED_RELEASE_TABLET | Freq: Every day | ORAL | Status: DC

## 2013-02-23 MED ORDER — LOSARTAN POTASSIUM 50 MG PO TABS: 50.0000 mg | ORAL_TABLET | Freq: Every day | ORAL | Status: DC

## 2013-02-23 MED ORDER — TRAMADOL HCL 50 MG PO TABS: 50.0000 mg | ORAL_TABLET | Freq: Two times a day (BID) | ORAL | Status: DC | PRN

## 2013-02-23 MED ORDER — HYDROCHLOROTHIAZIDE 12.5 MG PO CAPS: 12.5000 mg | ORAL_CAPSULE | Freq: Every day | ORAL | Status: DC

## 2013-02-23 NOTE — Patient Instructions (Addendum)
I wouldn't get the shingles shot since you have trouble with neosporin.   Try the tramadol for pain.  Take care.

## 2013-02-23 NOTE — Assessment & Plan Note (Signed)
Controlled, continue current meds.   

## 2013-02-23 NOTE — Assessment & Plan Note (Signed)
Tramadol for pain with sedation caution.  Likely OA w/o locking.  No need to image today.

## 2013-02-23 NOTE — Progress Notes (Signed)
She got a flu shot today.  She shouldn't get a shingles shot, due to her med allergies.  Discussed.    Her husband died and she is dealing with this.  She was able to get to [place] for the wedding of a family member. She is still living alone.  She may change her living situation in the future; she'll consider this.  "I want to think about what I want to do."  She may travel some and and may need refills of med while on the road.  rxs printed.    Hypertension:    Using medication without problems or lightheadedness: yes Chest pain with exertion:no Edema:occ, limited, at baseline Short of breath:no  She has persistent knee pain and tylenol doesn't help.  Asking about options.  She had injured it years ago. Pain walking.    Meds, vitals, and allergies reviewed.   ROS: See HPI.  Otherwise, noncontributory.  nad ncat Mmm rrr ctab abd soft Ext w/o edema Lateral L knee ttp with crepitus.

## 2013-05-18 ENCOUNTER — Encounter (HOSPITAL_COMMUNITY): Payer: Self-pay | Admitting: Emergency Medicine

## 2013-05-18 ENCOUNTER — Emergency Department (HOSPITAL_COMMUNITY)
Admission: EM | Admit: 2013-05-18 | Discharge: 2013-05-18 | Disposition: A | Discharge status: Home / Self Care | Payer: Medicare HMO | Attending: Emergency Medicine | Admitting: Emergency Medicine

## 2013-05-18 ENCOUNTER — Telehealth: Payer: Self-pay

## 2013-05-18 ENCOUNTER — Emergency Department (HOSPITAL_COMMUNITY): Payer: Medicare HMO

## 2013-05-18 ENCOUNTER — Ambulatory Visit: Payer: Medicare HMO | Admitting: Family Medicine

## 2013-05-18 DIAGNOSIS — R5381 Other malaise: Secondary | ICD-10-CM | POA: Insufficient documentation

## 2013-05-18 DIAGNOSIS — R42 Dizziness and giddiness: Secondary | ICD-10-CM | POA: Insufficient documentation

## 2013-05-18 DIAGNOSIS — E785 Hyperlipidemia, unspecified: Secondary | ICD-10-CM | POA: Insufficient documentation

## 2013-05-18 DIAGNOSIS — I10 Essential (primary) hypertension: Secondary | ICD-10-CM | POA: Insufficient documentation

## 2013-05-18 DIAGNOSIS — K219 Gastro-esophageal reflux disease without esophagitis: Secondary | ICD-10-CM | POA: Insufficient documentation

## 2013-05-18 DIAGNOSIS — R55 Syncope and collapse: Secondary | ICD-10-CM | POA: Insufficient documentation

## 2013-05-18 LAB — CBC WITH DIFFERENTIAL/PLATELET
Basophils Absolute: 0.1 10*3/uL (ref 0.0–0.1)
Basophils Relative: 1 % (ref 0–1)
Lymphocytes Relative: 20 % (ref 12–46)
Neutro Abs: 6.4 10*3/uL (ref 1.7–7.7)
Platelets: 381 10*3/uL (ref 150–400)
RDW: 13 % (ref 11.5–15.5)
WBC: 9.2 10*3/uL (ref 4.0–10.5)

## 2013-05-18 LAB — COMPREHENSIVE METABOLIC PANEL
ALT: 12 U/L (ref 0–35)
AST: 13 U/L (ref 0–37)
Albumin: 3.8 g/dL (ref 3.5–5.2)
CO2: 28 mEq/L (ref 19–32)
Calcium: 9.7 mg/dL (ref 8.4–10.5)
Chloride: 101 mEq/L (ref 96–112)
GFR calc non Af Amer: 63 mL/min — ABNORMAL LOW (ref >90)
Sodium: 138 mEq/L (ref 135–145)
Total Bilirubin: 0.2 mg/dL — ABNORMAL LOW (ref 0.3–1.2)

## 2013-05-18 LAB — URINALYSIS, ROUTINE W REFLEX MICROSCOPIC
Glucose, UA: NEGATIVE mg/dL
Hgb urine dipstick: NEGATIVE
Protein, ur: NEGATIVE mg/dL
pH: 7 (ref 5.0–8.0)

## 2013-05-18 LAB — URINE MICROSCOPIC-ADD ON

## 2013-05-18 LAB — OCCULT BLOOD, POC DEVICE: Fecal Occult Bld: NEGATIVE

## 2013-05-18 NOTE — Telephone Encounter (Signed)
Pt and daughter walked in; pt was at [employer] and slumped over; daughter said she thinks she lost consciousness;? How long was out. EMS came and pt was awake, answering questions appropriately,pt refused EMS to take pt to hospital and pts daughter brought pt to our office. Pt has no CP, SOB, H/A,dizziness and no extremity weakness. Pt said she feels normal now. Pt did not have warning prior to passing out. Pt usually eats late morning so this time of day was not unusual for pt to eat her morning meal. Dr Patsy Lager advised pt had a syncope episode and needs to be evaluated at ED. Spoke with pt and her daughter and both agreed for pt to go to [facility] ED; pt requested EMS to be called back to take pt to ED. EMS came and transported pt to [facility] ED.

## 2013-05-18 NOTE — ED Notes (Signed)
Ambulated pt. well tolerated, no discomfort or dizziness.

## 2013-05-18 NOTE — ED Notes (Signed)
Pt tolerating fluids.   

## 2013-05-18 NOTE — Telephone Encounter (Signed)
agree

## 2013-05-18 NOTE — ED Provider Notes (Signed)
CSN: [id number]     Arrival date & time 05/18/13  1229 History   First MD Initiated Contact with Patient 05/18/13 1239     Chief Complaint  Patient presents with  . Near Syncope   (Consider location/radiation/quality/duration/timing/severity/associated sxs/prior Treatment) HPI Comments: Patient presents via EMS after she had a near syncopal episode at a restaurant. She been sitting for a long period of time and went to stand up. She immediately became lightheaded and sat back down. She was able to get up again take a few steps before he can lightheaded again and had a sitdown on a chair. She is uncertain if she loss consciousness. Lightheadedness was associated with nausea and a hot feeling. Denies any sweating, vision change, chest pain or shortness of breath. No abdominal pain or back pain. No vomiting. She admits to drinking only a little this morning. She's had 4 similar episodes in the past that she says have been worked up. Denies vertigo.  The history is provided by the patient and the EMS personnel.    Past Medical History  Diagnosis Date  . Hyperlipidemia   . GERD (gastroesophageal reflux disease)   . Sleep apnea   . Overactive bladder   . Kidney stones   . Hypertension   . Allergy   . Chronic dermatitis     face, allergic to many products  . AK (actinic keratosis)    Past Surgical History  Procedure Laterality Date  . Broken nose    . Colonoscopy  2004   Family History  Problem Relation Age of Onset  . Cancer Mother     stomach  . Coronary artery disease Father   . Colon cancer Neg Hx    History  Substance Use Topics  . Smoking status: Never Smoker   . Smokeless tobacco: Never Used  . Alcohol Use: Yes   OB History   Grav Para Term Preterm Abortions TAB SAB Ect Mult Living                 Review of Systems  Constitutional: Negative for fever, activity change, appetite change and fatigue.  Eyes: Negative for visual disturbance.  Respiratory: Negative for  cough, chest tightness and shortness of breath.   Cardiovascular: Negative for chest pain.  Gastrointestinal: Negative for nausea, vomiting and abdominal pain.  Genitourinary: Negative for dysuria and hematuria.  Musculoskeletal: Negative for back pain and neck pain.  Skin: Negative for wound.  Neurological: Positive for syncope, weakness and light-headedness. Negative for dizziness and headaches.  A complete 10 system review of systems was obtained and all systems are negative except as noted in the HPI and PMH.    Allergies  Codeine; Levofloxacin; Neomycin-bacitracin zn-polymyx; Neospect; and Spironolactone  Home Medications   Current Outpatient Rx  Name  Route  Sig  Dispense  Refill  . acetaminophen (TYLENOL) 500 MG tablet   Oral   Take 1,000 mg by mouth at bedtime. OTC as directed         . cholecalciferol (VITAMIN D) 1000 UNITS tablet   Oral   Take 1,000 Units by mouth daily.           . hydrochlorothiazide (MICROZIDE) 12.5 MG capsule   Oral   Take 1 capsule (12.5 mg total) by mouth daily.   90 capsule   3   . losartan (COZAAR) 50 MG tablet   Oral   Take 1 tablet (50 mg total) by mouth daily.   90 tablet   3   .  traMADol (ULTRAM) 50 MG tablet   Oral   Take 1 tablet (50 mg total) by mouth 2 (two) times daily as needed for pain.   30 tablet   0   . verapamil (CALAN-SR) 240 MG CR tablet   Oral   Take 1 tablet (240 mg total) by mouth at bedtime.   90 tablet   3   . esomeprazole (NEXIUM) 40 MG capsule   Oral   Take 1 capsule (40 mg total) by mouth daily before breakfast.   30 capsule   6    BP 161/77  Pulse 77  Temp(Src) 97.7 F (36.5 C)  Resp 18  SpO2 98% Physical Exam  Constitutional: She is oriented to person, place, and time. She appears well-developed and well-nourished. No distress.  HENT:  Head: Normocephalic and atraumatic.  Mouth/Throat: Oropharynx is clear and moist. No oropharyngeal exudate.  Eyes: Conjunctivae and EOM are normal.  Pupils are equal, round, and reactive to light.  Neck: Normal range of motion. Neck supple.  Cardiovascular: Normal rate, regular rhythm and normal heart sounds.   No murmur heard. Pulmonary/Chest: Effort normal and breath sounds normal. No respiratory distress.  Abdominal: Soft. There is no tenderness. There is no rebound and no guarding.  Musculoskeletal: Normal range of motion. She exhibits no edema and no tenderness.  Neurological: She is alert and oriented to person, place, and time. No cranial nerve deficit. She exhibits normal muscle tone. Coordination normal.  CN 2-12 intact, no ataxia on finger to nose, no nystagmus, 5/5 strength throughout, no pronator drift, Romberg negative, normal gait.   Skin: Skin is warm.    ED Course  Procedures (including critical care time) Labs Review Labs Reviewed  CBC WITH DIFFERENTIAL - Abnormal; Notable for the following:    Hemoglobin 11.7 (*)    HCT 35.8 (*)    All other components within normal limits  COMPREHENSIVE METABOLIC PANEL - Abnormal; Notable for the following:    Total Bilirubin 0.2 (*)    GFR calc non Af Amer 63 (*)    GFR calc Af Amer 72 (*)    All other components within normal limits  URINALYSIS, ROUTINE W REFLEX MICROSCOPIC - Abnormal; Notable for the following:    APPearance CLOUDY (*)    Bilirubin Urine SMALL (*)    Leukocytes, UA MODERATE (*)    All other components within normal limits  URINE MICROSCOPIC-ADD ON - Abnormal; Notable for the following:    Squamous Epithelial / LPF FEW (*)    Bacteria, UA FEW (*)    Casts GRANULAR CAST (*)    All other components within normal limits  URINE CULTURE  TROPONIN I  OCCULT BLOOD, POC DEVICE   Imaging Review Dg Chest 2 View  05/18/2013   CLINICAL DATA:  Recent syncopal event  EXAM: CHEST  2 VIEW  COMPARISON:  07/25/2008  FINDINGS: A large hiatal hernia is noted. The cardiac shadow is stable. The lungs are well aerated bilaterally and mildly hyperinflated. No focal  infiltrate or sizable effusion is seen. A compression deformity is again noted in the mid thoracic spine.  IMPRESSION: Hiatal hernia.  COPD.  No acute abnormality noted.   Electronically Signed   By: Alcide Clever M.D.   On: 05/18/2013 13:48    EKG Interpretation    Date/Time:  Wednesday May 18 2013 14:08:02 EST Ventricular Rate:  61 PR Interval:  165 QRS Duration: 104 QT Interval:  434 QTC Calculation: 437 R Axis:   -31 Text  Interpretation:  Sinus rhythm Left axis deviation Probable anteroseptal infarct, old No significant change was found Confirmed by Manus Gunning  MD, Jae Bruck [phone]) on 05/18/2013 2:14:59 PM            MDM   1. Near syncope    Near syncopal episode when getting up from a chair dizziness, hot flashes and lightheadedness. No chest pain or shortness of breath. Did not hit head. No chest pain or shortness of breath. Episode occurred upon standing and appears to be vasovagal.  Neuro intact, EKG unchanged. Near syncopal episode was preceded by lightheadedness, hot flashes and dizziness. This prodrome does not suggest a cardiogenic etiology of syncope. Patient has had no arrhythmias on the monitor. She feels at baseline. Her family states she is at baseline.  Hemoglobin 11.7. Previous values 14 2 years ago.  Denies any blood in the stool.  FOBT negative.   Discussed with PCP Dr. Para March who states patient did see cardiology in 2012 after syncopal episode. Last echocardiogram in 2010 and was normal. She has no murmur today. Dr Para March agrees that hemoglobin of 11.7 is unlikely to cause syncope. UA appears contaminated, culture sent.  [place] syncope rules negative. Patient tolerating PO and ambulatory. FOBT negative today, denies seeing any blood in her stool.   Glynn Octave, MD 05/18/13 [phone]

## 2013-05-18 NOTE — ED Notes (Signed)
Bed: QI34 Expected date:  Expected time:  Means of arrival:  Comments: 79 y/o syncope

## 2013-05-18 NOTE — Telephone Encounter (Signed)
Agree with ER.  Thanks.  

## 2013-05-18 NOTE — ED Notes (Addendum)
Per EMS, pt was eating at [employer] when she stood up from her chair and had a near syncopal episode. The pt states she lost consciousness briefly. Pt went to her primary care doctor, who reccommended she been seen by ED.

## 2013-05-18 NOTE — ED Notes (Signed)
Will collect Orthostatic vitals and EKG when pt returns from radiology

## 2013-05-19 LAB — URINE CULTURE: Culture: NO GROWTH

## 2013-05-24 ENCOUNTER — Encounter: Payer: Self-pay | Admitting: Family Medicine

## 2013-05-24 ENCOUNTER — Ambulatory Visit (INDEPENDENT_AMBULATORY_CARE_PROVIDER_SITE_OTHER): Payer: Medicare HMO | Admitting: Family Medicine

## 2013-05-24 VITALS — BP 128/62 | HR 72 | Temp 98.3°F | Wt 177.8 lb

## 2013-05-24 DIAGNOSIS — L989 Disorder of the skin and subcutaneous tissue, unspecified: Secondary | ICD-10-CM

## 2013-05-24 DIAGNOSIS — K5900 Constipation, unspecified: Secondary | ICD-10-CM

## 2013-05-24 DIAGNOSIS — R55 Syncope and collapse: Secondary | ICD-10-CM

## 2013-05-24 MED ORDER — LOSARTAN POTASSIUM 50 MG PO TABS: 25.0000 mg | ORAL_TABLET | Freq: Every day | ORAL | Status: DC

## 2013-05-24 NOTE — Patient Instructions (Signed)
Cut the losartan in half- take 25mg  a day.  Get some miralax (OTC) and take as needed for your bowels.  Go to the lab on the way out.  We'll contact you with your lab report. I'll check with Dr. Terri Piedra.  Take care.

## 2013-05-24 NOTE — Progress Notes (Signed)
Pre-visit discussion using our clinic review tool. No additional management support is needed unless otherwise documented below in the visit note.  ER f/u.  Was eating at a restaurant, stood up, felt warm, presyncopal; sat back down. She was able to get up again take a few steps before she was lightheaded again and had a sitdown on a chair.  In retrospect, she did likely pass out briefly- she was incontinent of urine and family saw her "eye roll back in her head."  No twitching movement.  Episodes lasted seconds, not minutes. To ER, unremarkable w/u.  Here for f/u today.  No other new sx in the meantime.  HGB noted to be slightly low in ER.  No black stools. FOBT neg in ER.  No other bleeding.  ER notes reviewed.   Recently with constipation noted. Asking for advice.   Prev seen by Dr. Terri Piedra with derm for skin lesions. Treated with doxy x2 courses. Not resolved, asking for advice.  Meds, vitals, and allergies reviewed.   ROS: See HPI.  Otherwise, noncontributory.  GEN: nad, alert and oriented HEENT: mucous membranes moist NECK: supple w/o LA CV: rrr.  no murmur PULM: ctab, no inc wob ABD: soft, +bs EXT: no edema SKIN: no acute rash but chronic epithelial disruptions noted on arms > legs, they don't appear infected.

## 2013-05-25 ENCOUNTER — Other Ambulatory Visit: Payer: Self-pay | Admitting: Family Medicine

## 2013-05-25 DIAGNOSIS — K5900 Constipation, unspecified: Secondary | ICD-10-CM | POA: Insufficient documentation

## 2013-05-25 DIAGNOSIS — D649 Anemia, unspecified: Secondary | ICD-10-CM

## 2013-05-25 LAB — CBC WITH DIFFERENTIAL/PLATELET
Basophils Relative: 1.5 % (ref 0.0–3.0)
Eosinophils Relative: 5.9 % — ABNORMAL HIGH (ref 0.0–5.0)
HCT: 28.8 % — ABNORMAL LOW (ref 36.0–46.0)
Hemoglobin: 9.6 g/dL — ABNORMAL LOW (ref 12.0–15.0)
Lymphocytes Relative: 29.9 % (ref 12.0–46.0)
Lymphs Abs: 2.4 10*3/uL (ref 0.7–4.0)
Monocytes Relative: 9.4 % (ref 3.0–12.0)
Neutro Abs: 4.3 10*3/uL (ref 1.4–7.7)
RBC: 3.26 Mil/uL — ABNORMAL LOW (ref 3.87–5.11)
WBC: 8.1 10*3/uL (ref 4.5–10.5)

## 2013-05-25 MED ORDER — FLUOCINONIDE-E 0.05 % EX CREA: 1.0000 "application " | TOPICAL_CREAM | Freq: Two times a day (BID) | CUTANEOUS | Status: DC

## 2013-05-25 NOTE — Assessment & Plan Note (Signed)
Recheck HGB today.  She was likely dehydrated at the time- hadn't had much to drink. Likely combination of that with BP meds, would taper ARB. D/w pt. She agrees. Prev with cards w/u.  She had prodrome, wouldn't intervene further now.  She agrees. >25 min spent with face to face with patient, >50% counseling and/or coordinating care.

## 2013-05-25 NOTE — Telephone Encounter (Signed)
Patient notified as instructed by telephone. Patient stated that she does not have any cream left and was trying to get in with Dr. Terri Piedra, but can't get in with him until January. Advised patient that we will send in one refill and to follow-up with Dr. Terri Piedra.

## 2013-05-25 NOTE — Telephone Encounter (Signed)
See lab report.  Call pt about the following also.  I talked to Dr. Terri Piedra- pt likely had an infected dermatitis.  The lesion on her skin didn't look infected yesterday.  I wouldn't restart abx at this point. She may have some fluocinonide from Dr. Terri Piedra.  If so, then I would use that on the areas and cancel the rx below.  If she doesn't have any cream, then please send in the rx below.  If not improved, Dr. Terri Piedra wanted to see her back.  Thanks.

## 2013-05-25 NOTE — Assessment & Plan Note (Signed)
rec miralax prn.

## 2013-05-25 NOTE — Assessment & Plan Note (Signed)
Will d/w derm.

## 2013-05-26 ENCOUNTER — Other Ambulatory Visit (INDEPENDENT_AMBULATORY_CARE_PROVIDER_SITE_OTHER): Payer: Medicare HMO

## 2013-05-26 ENCOUNTER — Encounter: Payer: Self-pay | Admitting: Family Medicine

## 2013-05-26 ENCOUNTER — Other Ambulatory Visit: Payer: Self-pay | Admitting: Family Medicine

## 2013-05-26 DIAGNOSIS — D649 Anemia, unspecified: Secondary | ICD-10-CM

## 2013-05-26 DIAGNOSIS — D509 Iron deficiency anemia, unspecified: Secondary | ICD-10-CM | POA: Insufficient documentation

## 2013-05-26 LAB — VITAMIN B12: Vitamin B-12: 279 pg/mL (ref 211–911)

## 2013-05-26 LAB — IBC PANEL
Saturation Ratios: 4.8 % — ABNORMAL LOW (ref 20.0–50.0)
Transferrin: 312.7 mg/dL (ref 212.0–360.0)

## 2013-05-26 MED ORDER — FERROUS SULFATE 325 (65 FE) MG PO TABS: 325.0000 mg | ORAL_TABLET | Freq: Every day | ORAL | Status: DC

## 2013-05-26 NOTE — Addendum Note (Signed)
Addended by: Liane Comber C on: 05/26/2013 12:08 PM   Modules accepted: Orders

## 2013-05-27 ENCOUNTER — Encounter: Payer: Self-pay | Admitting: Internal Medicine

## 2013-06-02 ENCOUNTER — Other Ambulatory Visit (INDEPENDENT_AMBULATORY_CARE_PROVIDER_SITE_OTHER): Payer: Medicare HMO

## 2013-06-02 ENCOUNTER — Other Ambulatory Visit: Payer: Self-pay | Admitting: Family Medicine

## 2013-06-02 ENCOUNTER — Telehealth: Payer: Self-pay | Admitting: Radiology

## 2013-06-02 DIAGNOSIS — R195 Other fecal abnormalities: Secondary | ICD-10-CM

## 2013-06-02 DIAGNOSIS — D649 Anemia, unspecified: Secondary | ICD-10-CM

## 2013-06-02 LAB — FECAL OCCULT BLOOD, IMMUNOCHEMICAL: Fecal Occult Bld: POSITIVE

## 2013-06-02 NOTE — Telephone Encounter (Signed)
[facility] called with a POSITIVE ibob result

## 2013-06-02 NOTE — Telephone Encounter (Signed)
Noted  

## 2013-06-13 ENCOUNTER — Telehealth: Payer: Self-pay | Admitting: Family Medicine

## 2013-06-13 NOTE — Telephone Encounter (Signed)
Pt's daughter called and wanted to let you know that while visiting [place], [place] pt had another episode where she passed out.  Ms. [name] is being admitted to a hospital in [place], [place] (daughter wasn't certain of name of hospital).  Ms. [name] basically just wanted to make you aware of what's going on, but says if you need to talk to anyone you can call them (I included the # to reach them in the contacts info). Thank you.

## 2013-06-13 NOTE — Telephone Encounter (Signed)
Noted, thanks!

## 2013-06-14 NOTE — Telephone Encounter (Signed)
I called her daughter and she is still inpatient in [place].  I'll await the f/u reports.  I offered my support and thanked her for taking the call.

## 2013-06-20 ENCOUNTER — Telehealth: Payer: Self-pay | Admitting: Cardiovascular Disease

## 2013-06-20 ENCOUNTER — Encounter: Payer: Self-pay | Admitting: Cardiovascular Disease

## 2013-06-20 ENCOUNTER — Telehealth: Payer: Self-pay | Admitting: *Deleted

## 2013-06-20 ENCOUNTER — Ambulatory Visit (INDEPENDENT_AMBULATORY_CARE_PROVIDER_SITE_OTHER): Payer: Medicare HMO | Admitting: Cardiovascular Disease

## 2013-06-20 VITALS — BP 150/70 | HR 72 | Wt 174.0 lb

## 2013-06-20 DIAGNOSIS — R569 Unspecified convulsions: Secondary | ICD-10-CM

## 2013-06-20 DIAGNOSIS — I10 Essential (primary) hypertension: Secondary | ICD-10-CM

## 2013-06-20 DIAGNOSIS — R55 Syncope and collapse: Secondary | ICD-10-CM

## 2013-06-20 NOTE — Telephone Encounter (Signed)
HAS APPT  TODAY WITH  DR Eden Emms AT  11:45 am .[name]

## 2013-06-20 NOTE — Progress Notes (Signed)
Patient ID: [name], female   DOB: 1924-06-23, 79 y.o.   MRN: [id number] 80 yo referred by Dr Para March for presyncope  I took care of her husband before he passed last year  She was seen 11/14 with similar episode.  Was eating at restaurant and passed out for a few seconds. Has had 4 more episodes last 2 months.  No palpitations , chest pain.  Feels warm and flushed just prior to episodes.  Has not hurt herself.  Was in hospital in [place] day before Christmas for similar spell  She indicates that CT/MRI and "brain scan" were normal  She did not have echo.  No arrhythmia noted    Several episodes in past year when patient was upright and went to ground. She was presyncopal, but didn't have full LOC. No CP. The episode would pass on its own and she would 'feel washed out' for the rest of the day. No orthostatic/positional triggers. No recent changes in meds to cause the event. Last event was about 1 week ago. No CP/SOB now. No presyncopal feeling now. Compliant with meds. H/o PNA and is caring for husband who had mult medical problems. Prev with neg cath at [facility]. Saw Dr Antoine Poche in 2009 for dyspnea.with normal BNP and normal ECHO. Episodes sound like simple faints. No palpitatons, SSCP. Has not hurt herself. Most episodes in the am. Not related to meds and no documented bradycardia. K was high on aldactone and has been held last 7-10 days but her LE edema is worse. Looking forward to a trip to [place].    ROS: Denies fever, malais, weight loss, blurry vision, decreased visual acuity, cough, sputum, SOB, hemoptysis, pleuritic pain, palpitaitons, heartburn, abdominal pain, melena, lower extremity edema, claudication, or rash.  All other systems reviewed and negative  General: Affect appropriate Healthy:  appears stated age HEENT: normal Neck supple with no adenopathy JVP normal no bruits no thyromegaly Lungs clear with no wheezing and good diaphragmatic motion Heart:  S1/S2 no murmur, no rub,  gallop or click PMI normal Abdomen: benighn, BS positve, no tenderness, no AAA no bruit.  No HSM or HJR Distal pulses intact with no bruits No edema Neuro non-focal Skin warm and dry No muscular weakness   Current Outpatient Prescriptions  Medication Sig Dispense Refill  . acetaminophen (TYLENOL) 500 MG tablet Take 1,000 mg by mouth at bedtime. OTC as directed      . carboxymethylcellulose (REFRESH PLUS) 0.5 % SOLN 1 drop 3 (three) times daily as needed.      . cholecalciferol (VITAMIN D) 1000 UNITS tablet Take 1,000 Units by mouth daily.        . ferrous sulfate 325 (65 FE) MG tablet Take 1 tablet (325 mg total) by mouth daily with breakfast.      . fluocinonide-emollient (LIDEX-E) 0.05 % cream Apply 1 application topically 2 (two) times daily. On affected areas.  30 g  0  . hydrochlorothiazide (MICROZIDE) 12.5 MG capsule Take 1 capsule (12.5 mg total) by mouth daily.  90 capsule  3  . hydrOXYzine (ATARAX/VISTARIL) 10 MG tablet Take 1 to 3 tablets by mouth every 4 to 6 hours as needed for itching.      . losartan (COZAAR) 50 MG tablet Take 0.5 tablets (25 mg total) by mouth daily.      . traMADol (ULTRAM) 50 MG tablet Take 1 tablet (50 mg total) by mouth 2 (two) times daily as needed for pain.  30 tablet  0  .  verapamil (CALAN-SR) 240 MG CR tablet Take 1 tablet (240 mg total) by mouth at bedtime.  90 tablet  3   No current facility-administered medications for this visit.    Allergies  Codeine; Levofloxacin; Neomycin-bacitracin zn-polymyx; Neospect; and Spironolactone  Electrocardiogram:  11/26  SR rate 61 no AV block or conduction abnormalities   Assessment and Plan

## 2013-06-20 NOTE — Patient Instructions (Signed)
Your physician recommends that you schedule a follow-up appointment in: NEXT  AVAILABLE WITH  EP  POSSIBLE LOOP  IMPLANT  6 MONTHS WITH   DR [facility] Your physician recommends that you continue on your current medications as directed. Please refer to the Current Medication list given to you today. You have been referred to DR  TAT   ? SEIZURES Your physician has requested that you have an echocardiogram. Echocardiography is a painless test that uses sound waves to create images of your heart. It provides your doctor with information about the size and shape of your heart and how well your heart's chambers and valves are working. This procedure takes approximately one hour. There are no restrictions for this procedure.

## 2013-06-20 NOTE — Telephone Encounter (Signed)
Patient is back at home now and it was recommended by her physician at the hospital in [place] that she see her cardiologist and possibly wear a monitor for a while.  They have an appointment today with Cardiology [facility]).  Does she need to follow up with you also or just follow instructions from Cards?

## 2013-06-20 NOTE — Assessment & Plan Note (Addendum)
Doubt cardiac etiology with no cardiac symptoms and benign ECG  F/U echo .  Will make EP appt if episodes continues may benefit from ILR Will get records from [facility]  Then f/u with Dr Tat from neurology  Discussed no driving for 6 months and she will be compliant

## 2013-06-20 NOTE — Telephone Encounter (Signed)
Daughter advised.

## 2013-06-20 NOTE — Telephone Encounter (Signed)
I would just go to the cards appointment.  If they think she needs to come here, they'll likely notify her.  Thanks.

## 2013-06-20 NOTE — Telephone Encounter (Signed)
New Message  Pt daughter called states that the pt has had 2 episodes.[name] past has had a very bad fainting spell went to ER//nothing was found and was discharged. While in [place] recently. she had another spell.. all vital signs normal however they recommended that she see her cardiologist.. Requested a same day appt.. Confirmed with Nurse to schedule for 11:45 12/29//SR

## 2013-06-20 NOTE — Assessment & Plan Note (Signed)
Well controlled.  Continue current medications and low sodium Dash type diet.   Symptoms are not postural

## 2013-06-27 ENCOUNTER — Encounter: Payer: Self-pay | Admitting: Internal Medicine

## 2013-06-29 ENCOUNTER — Telehealth: Payer: Self-pay | Admitting: Internal Medicine

## 2013-06-29 NOTE — Telephone Encounter (Signed)
ROI faxed to [facility] at [phone]

## 2013-07-01 ENCOUNTER — Ambulatory Visit: Payer: Medicare HMO | Admitting: Internal Medicine

## 2013-07-06 ENCOUNTER — Other Ambulatory Visit (HOSPITAL_COMMUNITY): Payer: Medicare HMO

## 2013-07-15 ENCOUNTER — Encounter: Payer: Self-pay | Admitting: Neurology

## 2013-07-15 ENCOUNTER — Ambulatory Visit (INDEPENDENT_AMBULATORY_CARE_PROVIDER_SITE_OTHER): Payer: Medicare HMO | Admitting: Neurology

## 2013-07-15 VITALS — BP 130/78 | HR 68 | Temp 97.4°F | Resp 18 | Ht 62.0 in | Wt 173.5 lb

## 2013-07-15 DIAGNOSIS — R55 Syncope and collapse: Secondary | ICD-10-CM

## 2013-07-15 NOTE — Progress Notes (Signed)
NEUROLOGY CONSULTATION NOTE  [name] MRN: [id number] DOB: [birth year]  Referring provider: Dr. Eden Emms Primary care provider: Dr. Para March  Reason for consult:  Syncope vs seizure  HISTORY OF PRESENT ILLNESS: [name] is an 80 year old right-handed woman with hypertension, hyperlipidemia and sleep apnea who presents for evaluation of seizures vs syncope.  She is accompanied by her daughters.  Records and images were personally reviewed where available.    On the day before Thanksgiving, she was at a restaurant.  She stood up to take off her vest when suddenly she had the feeling that she was going to faint.  There was no associated epigastric rising or abnormal smell or taste.  She did not experience tunnel vision, palpitations or diaphoresis.  This lasted only a few seconds.  She sat back down to prevent herself from passing out, but then she lost consciousness while in the chair.  Family grabbed her and helped lay her on the ground.  There was no shaking, twitching or tongue biting .  She did have urinary incontinence.  She was unconscious for just a few seconds.  When she woke up, she was oriented and was able to converse.  She went to the ED and everything seemed fine.  Last month, she was in [place] on vacation when she had another spell.  She was sitting in a chair drinking coffee when she suddenly felt like she was going to pass out.  Her daughter heard a loud gurgling type sound and when she walked into the room, the patient was in the chair unconscious for just a few seconds.  When she awoke, she was oriented and able to speak. She was admitted to a local hospital.  She reportedly had a workup that included an echo, MRI of the brain, and EEG.  There were no abnormalities and a diagnosis was not made.  She reports a similar spell over 10 years ago.  She was in the kitchen cooking sausages, when she suddenly felt like she was going to pass out.  She quickly got down on the floor and passed  out for a few seconds.  She has no reported history of seizure, head trauma or meningoencephalitis.  She has no history of stroke.  There is no family history of seizures.  She has been under a lot of stress over the past year.  She cared for her sick husband for 3 years before he passed away this past year.  She also has untreated sleep apnea as well.  PAST MEDICAL HISTORY: Past Medical History  Diagnosis Date  . Hyperlipidemia   . GERD (gastroesophageal reflux disease)   . Sleep apnea   . Overactive bladder   . Kidney stones   . Hypertension   . Allergy   . Chronic dermatitis     face, allergic to many products  . AK (actinic keratosis)     PAST SURGICAL HISTORY: Past Surgical History  Procedure Laterality Date  . Broken nose    . Colonoscopy  2004    MEDICATIONS: Current Outpatient Prescriptions on File Prior to Visit  Medication Sig Dispense Refill  . acetaminophen (TYLENOL) 500 MG tablet Take 1,000 mg by mouth at bedtime. OTC as directed      . carboxymethylcellulose (REFRESH PLUS) 0.5 % SOLN 1 drop 3 (three) times daily as needed.      . cholecalciferol (VITAMIN D) 1000 UNITS tablet Take 1,000 Units by mouth daily.        Marland Kitchen  ferrous sulfate 325 (65 FE) MG tablet Take 1 tablet (325 mg total) by mouth daily with breakfast.      . fluocinonide-emollient (LIDEX-E) 0.05 % cream Apply 1 application topically 2 (two) times daily. On affected areas.  30 g  0  . hydrochlorothiazide (MICROZIDE) 12.5 MG capsule Take 1 capsule (12.5 mg total) by mouth daily.  90 capsule  3  . hydrOXYzine (ATARAX/VISTARIL) 10 MG tablet Take 1 to 3 tablets by mouth every 4 to 6 hours as needed for itching.      . losartan (COZAAR) 50 MG tablet Take 0.5 tablets (25 mg total) by mouth daily.      . traMADol (ULTRAM) 50 MG tablet Take 1 tablet (50 mg total) by mouth 2 (two) times daily as needed for pain.  30 tablet  0  . verapamil (CALAN-SR) 240 MG CR tablet Take 1 tablet (240 mg total) by mouth at bedtime.   90 tablet  3   No current facility-administered medications on file prior to visit.    ALLERGIES: Allergies  Allergen Reactions  . Codeine     REACTION: dizzy  . Levofloxacin     REACTION: itching  . Neomycin-Bacitracin Zn-Polymyx   . Neospect [Dyphylline-Ephed-Pb-Gg Or]   . Spironolactone     Hyperkalemia     FAMILY HISTORY: Family History  Problem Relation Age of Onset  . Cancer Mother     stomach  . Coronary artery disease Father   . Colon cancer Neg Hx     SOCIAL HISTORY: History   Social History  . Marital Status: Married    Spouse Name: N/A    Number of Children: N/A  . Years of Education: N/A   Occupational History  . Not on file.   Social History Main Topics  . Smoking status: Former Smoker    Types: Cigarettes  . Smokeless tobacco: Never Used     Comment: smoked during college  . Alcohol Use: Not on file  . Drug Use: Not on file  . Sexual Activity: Not on file   Other Topics Concern  . Not on file   Social History Narrative   Widowed 2014 -husband had coronary artery disease and valvular disease   Has grand children and great grandchildren    REVIEW OF SYSTEMS: Constitutional: No fevers, chills, or sweats, no generalized fatigue, change in appetite Eyes: No visual changes, double vision, eye pain Ear, nose and throat: No hearing loss, ear pain, nasal congestion, sore throat Cardiovascular: No chest pain, palpitations Respiratory:  No shortness of breath at rest or with exertion, wheezes GastrointestinaI: No nausea, vomiting, diarrhea, abdominal pain, fecal incontinence Genitourinary:  No dysuria, urinary retention or frequency Musculoskeletal:  No neck pain, back pain Integumentary: No rash, pruritus, skin lesions Neurological: as above Psychiatric: No depression, insomnia, anxiety Endocrine: No palpitations, fatigue, diaphoresis, mood swings, change in appetite, change in weight, increased thirst Hematologic/Lymphatic:  No anemia,  purpura, petechiae. Allergic/Immunologic: no itchy/runny eyes, nasal congestion, recent allergic reactions, rashes  PHYSICAL EXAM: Filed Vitals:   07/15/13 1430  BP: 130/78  Pulse: 68  Temp: 97.4 F (36.3 C)  Resp: 18   General: No acute distress Head:  Normocephalic/atraumatic Neck: supple, no paraspinal tenderness, full range of motion Back: No paraspinal tenderness Heart: regular rate and rhythm Lungs: Clear to auscultation bilaterally. Vascular: No carotid bruits. Neurological Exam: Mental status: alert and oriented to person, place, and time, speech fluent and not dysarthric, language intact. Cranial nerves: CN I: not tested CN  II: pupils equal, round and reactive to light, visual fields intact, fundi unremarkable. CN III, IV, VI:  full range of motion, no nystagmus, no ptosis CN V: facial sensation intact CN VII: upper and lower face symmetric CN VIII: hearing intact CN IX, X: gag intact, uvula midline CN XI: sternocleidomastoid and trapezius muscles intact CN XII: tongue midline Bulk & Tone: normal, no fasciculations. Motor: 5/5 throughout Sensation: temperature and vibration intact Deep Tendon Reflexes: 2+ in UEs, 1+ in patellars, absent in ankles, toes down Finger to nose testing: normal Gait: normal stance and stride. Romberg with mild sway.  IMPRESSION: Syncope.  Semiology does not really suggest epileptic spell.  PLAN: 1.  Will repeat EEG to look for evidence of increased seizure tendency, in which case the possibility of starting an antiepileptic medication would be considered. 2.  Follow up with cardiology 3.  Since she had an unexplained unprovoked loss of consciousness, no driving for 6 months as per [place] law 4.  Waiting on records from hospital admission in [place], including MRI of the brain report. 5.  Follow up in 4 months.  45 minutes spent with patient and daughters, over 50% spent counseling and coordinating care.  Thank you for allowing me to  take part in the care of this patient.  Shon Millet, DO  CC:  Charlton Haws, MD  Crawford Givens, MD

## 2013-07-15 NOTE — Patient Instructions (Addendum)
It really doesn't seem like a seizure.  But we will repeat the EEG  Feb 2 at [facility] entrance A at 2:45PM. to look for any abnormalities to suggest you are at risk for seizures.  As you already know, as per [place] law, you cannot drive for 6 months after an unexplained unprovoked episode of loss of consciousness (whether it is seizure or not).  Follow up in 4 weeks.

## 2013-07-18 ENCOUNTER — Ambulatory Visit (INDEPENDENT_AMBULATORY_CARE_PROVIDER_SITE_OTHER): Payer: Medicare HMO | Admitting: Family Medicine

## 2013-07-18 ENCOUNTER — Encounter: Payer: Self-pay | Admitting: Family Medicine

## 2013-07-18 VITALS — BP 134/62 | HR 82 | Temp 97.9°F | Wt 171.0 lb

## 2013-07-18 DIAGNOSIS — B3731 Acute candidiasis of vulva and vagina: Secondary | ICD-10-CM

## 2013-07-18 DIAGNOSIS — B373 Candidiasis of vulva and vagina: Secondary | ICD-10-CM

## 2013-07-18 DIAGNOSIS — R55 Syncope and collapse: Secondary | ICD-10-CM

## 2013-07-18 MED ORDER — FLUCONAZOLE 150 MG PO TABS: 150.0000 mg | ORAL_TABLET | Freq: Once | ORAL | Status: DC

## 2013-07-18 NOTE — Patient Instructions (Signed)
I'll work on Medical illustratorgetting your records.  Use the diflucan in the meantime, repeat in 1 week if needed.  Take care.

## 2013-07-18 NOTE — Progress Notes (Signed)
Pre-visit discussion using our clinic review tool. No additional management support is needed unless otherwise documented below in the visit note.  Syncope in [place] before xmax 2014. Was hospitalized, requesting records re: EEG, echo, MRI.  Has seen neuro and cards in the meantime. She had to push back her EP clinic visit. Asking about options at this point. Mult episodes of syncope in lifetime, but none since the event in [place].  No CP, not SOB.  She as a quick prodrome with each syncopal event (that happens only with syncopal events).  This is brief. She has no witnessed SZ activity.  She doesn't describe a typical postictal state.  She wanted to avoid repeat studies if possible.    She has had vaginal irritation and itching, c/w prev yeast infections per her report.   Meds, vitals, and allergies reviewed.   ROS: See HPI.  Otherwise, noncontributory.  GEN: nad, alert and oriented HEENT: mucous membranes moist NECK: supple w/o LA CV: rrr. PULM: ctab, no inc wob ABD: soft, +bs EXT: no edema SKIN: no acute rash

## 2013-07-20 DIAGNOSIS — B373 Candidiasis of vulva and vagina: Secondary | ICD-10-CM | POA: Insufficient documentation

## 2013-07-20 DIAGNOSIS — B3731 Acute candidiasis of vulva and vagina: Secondary | ICD-10-CM | POA: Insufficient documentation

## 2013-07-20 NOTE — Assessment & Plan Note (Signed)
Requesting records, will notify Dr. Graciela HusbandsKlein per her request.  She wanted to see him [facility]. No other new episodes in the meantime. Not driving, not orthostatic. Okay for outpatient f/u.

## 2013-07-20 NOTE — Assessment & Plan Note (Signed)
Diflucan and fu prn.  She agrees.

## 2013-07-22 ENCOUNTER — Institutional Professional Consult (permissible substitution): Payer: Medicare HMO | Admitting: Internal Medicine

## 2013-07-25 ENCOUNTER — Ambulatory Visit (HOSPITAL_COMMUNITY)
Admission: RE | Admit: 2013-07-25 | Discharge: 2013-07-25 | Disposition: A | Discharge status: Home / Self Care | Payer: Medicare HMO | Source: Ambulatory Visit | Attending: Neurology | Admitting: Neurology

## 2013-07-25 DIAGNOSIS — R55 Syncope and collapse: Secondary | ICD-10-CM

## 2013-07-25 NOTE — Progress Notes (Signed)
EEG Completed; Results Pending  

## 2013-07-26 ENCOUNTER — Telehealth: Payer: Self-pay | Admitting: *Deleted

## 2013-07-26 NOTE — Procedures (Signed)
ELECTROENCEPHALOGRAM REPORT  Date of Study: 07/25/2013  Patient's Name: [name] MRN: [id number] Date of Birth: [birth year]  Indication: Syncope  Medications: Tramadol, losartan, hydrochlorothiazide, verapamil  Technical Summary: This is a multichannel digital EEG recording, using the international 10-20 placement system.  Spike detection software was employed.  Description: The EEG background is symmetric, with a well-developed posterior dominant rhythm of 8 Hz, which is reactive to eye opening and closing.  Diffuse beta activity is seen, with a bilateral frontal preponderance.  No focal or generalized abnormalities are seen.  No focal or generalized epileptiform discharges are seen.  Stage II sleep is not seen.  Photic stimulation was performed, and produced no abnormalities.  Hyperventilation was not performed.  ECG revealed normal cardiac rate and rhythm.  Impression: This is a normal routine EEG of the awake and drowsy states, with activating procedures.  A normal study does not rule out the possibility of a seizure disorder in this patient.  Adam R. Everlena CooperJaffe, DO

## 2013-07-26 NOTE — Telephone Encounter (Signed)
Patient is aware that EEG was normal

## 2013-08-12 ENCOUNTER — Ambulatory Visit: Payer: Medicare HMO | Admitting: Neurology

## 2013-09-28 ENCOUNTER — Ambulatory Visit (INDEPENDENT_AMBULATORY_CARE_PROVIDER_SITE_OTHER): Payer: Medicare HMO | Admitting: Family Medicine

## 2013-09-28 ENCOUNTER — Encounter: Payer: Self-pay | Admitting: Family Medicine

## 2013-09-28 VITALS — BP 128/78 | HR 76 | Temp 98.1°F | Wt 169.2 lb

## 2013-09-28 DIAGNOSIS — R195 Other fecal abnormalities: Secondary | ICD-10-CM

## 2013-09-28 DIAGNOSIS — D509 Iron deficiency anemia, unspecified: Secondary | ICD-10-CM

## 2013-09-28 DIAGNOSIS — J069 Acute upper respiratory infection, unspecified: Secondary | ICD-10-CM

## 2013-09-28 NOTE — Patient Instructions (Signed)
Keep using the cold medicine and get your blood drawn on the way out (future order for CBC) We'll contact you with your lab report. Take care.

## 2013-09-28 NOTE — Progress Notes (Signed)
Pre visit review using our clinic review tool, if applicable. No additional management support is needed unless otherwise documented below in the visit note.  Recently with cough, fatigue.  No fevers, no ST.  No sputum.  No rash.  No ear pain.  No facial pain.  Taking chlorpheniramine and dextromethorphan in the meantime with some improvement.    No other episodes of syncope in the meantime.  She had cancelled f/u with GI in the meantime. She hasn't seen any blood in stool.  We discussed today.  Agreed to recheck CBC given the duration and the lack of sx in the meantime.    Meds, vitals, and allergies reviewed.   ROS: See HPI.  Otherwise, noncontributory.  GEN: nad, alert and oriented HEENT: mucous membranes moist, tm w/o erythema, nasal exam w/o erythema, clear discharge noted,  OP with cobblestoning NECK: supple w/o LA CV: rrr.   PULM: ctab, no inc wob EXT: no edema SKIN: no acute rash

## 2013-09-29 DIAGNOSIS — J069 Acute upper respiratory infection, unspecified: Secondary | ICD-10-CM | POA: Insufficient documentation

## 2013-09-29 LAB — CBC WITH DIFFERENTIAL/PLATELET
Basophils Absolute: 0.1 10*3/uL (ref 0.0–0.1)
Basophils Relative: 1.8 % (ref 0.0–3.0)
EOS PCT: 6.3 % — AB (ref 0.0–5.0)
Eosinophils Absolute: 0.5 10*3/uL (ref 0.0–0.7)
HEMATOCRIT: 41.7 % (ref 36.0–46.0)
HEMOGLOBIN: 13.6 g/dL (ref 12.0–15.0)
LYMPHS ABS: 3.8 10*3/uL (ref 0.7–4.0)
Lymphocytes Relative: 49.2 % — ABNORMAL HIGH (ref 12.0–46.0)
MCHC: 32.8 g/dL (ref 30.0–36.0)
MCV: 88.5 fl (ref 78.0–100.0)
MONO ABS: 1.1 10*3/uL — AB (ref 0.1–1.0)
MONOS PCT: 14.2 % — AB (ref 3.0–12.0)
NEUTROS ABS: 2.2 10*3/uL (ref 1.4–7.7)
Neutrophils Relative %: 28.5 % — ABNORMAL LOW (ref 43.0–77.0)
PLATELETS: 270 10*3/uL (ref 150.0–400.0)
RBC: 4.71 Mil/uL (ref 3.87–5.11)
RDW: 14.5 % (ref 11.5–14.6)
WBC: 7.7 10*3/uL (ref 4.5–10.5)

## 2013-09-29 NOTE — Assessment & Plan Note (Signed)
Likely resolving viral URI, continue OTC meds and f/u prn. Nontoxic.

## 2013-09-29 NOTE — Assessment & Plan Note (Signed)
She had cancelled f/u with GI in the meantime. She hasn't seen any blood in stool.  We discussed today.  Agreed to recheck CBC given the duration and the lack of sx in the meantime.

## 2013-11-08 ENCOUNTER — Ambulatory Visit (INDEPENDENT_AMBULATORY_CARE_PROVIDER_SITE_OTHER): Payer: Medicare HMO | Admitting: Family Medicine

## 2013-11-08 ENCOUNTER — Encounter: Payer: Self-pay | Admitting: Family Medicine

## 2013-11-08 VITALS — BP 146/80 | HR 71 | Temp 97.6°F | Wt 167.2 lb

## 2013-11-08 DIAGNOSIS — M5410 Radiculopathy, site unspecified: Secondary | ICD-10-CM

## 2013-11-08 DIAGNOSIS — IMO0002 Reserved for concepts with insufficient information to code with codable children: Secondary | ICD-10-CM

## 2013-11-08 NOTE — Patient Instructions (Signed)
Try 1 aleve up to twice a day with food.  Use the back exercises and notify me if you don't improve.  Take care.

## 2013-11-08 NOTE — Progress Notes (Signed)
Pre visit review using our clinic review tool, if applicable. No additional management support is needed unless otherwise documented below in the visit note.  No trauma known. R sided lower back pain and radicular component in R leg.  Heat and ice didn't help.  No rash.  No fevers.  No weakness.  No bowel or bladder dysfunction.  Tramadol isn't helping her pain.   Pain sitting, better laying down.  Taking tylenol w/o relief.  No blood in stool.  No other bleeding.  Hasn't tried aleve yet.  No GERD sx.  Off PPI for a period of time now and hasn't had any troubles.   Meds, vitals, and allergies reviewed.   ROS: See HPI.  Otherwise, noncontributory.  nad ncat Mmm rrr ctab abd soft, not ttp Ext w trace edema SLR neg but lower back ttp on R side.  Midline back not ttp.  Distally nv intact

## 2013-11-09 DIAGNOSIS — M5410 Radiculopathy, site unspecified: Secondary | ICD-10-CM | POA: Insufficient documentation

## 2013-11-09 NOTE — Assessment & Plan Note (Signed)
Anatomy d/w pt.  Likely sciatica.  D/w pt about routine back exercises and stretching, handout given.  Can try low dose of aleve with GI cautions in the meantime.  She agrees. Notify me if not improved. Okay for outpatient f/u.

## 2013-11-10 ENCOUNTER — Telehealth: Payer: Self-pay | Admitting: Family Medicine

## 2013-11-10 MED ORDER — HYDROCODONE-ACETAMINOPHEN 5-325 MG PO TABS: 0.5000 | ORAL_TABLET | Freq: Four times a day (QID) | ORAL | Status: DC | PRN

## 2013-11-10 NOTE — Telephone Encounter (Signed)
Pt states having pain down her right side. She has been taking the meds you prescribed for her on Monday and doing the exercises you suggested, however, she says the pain is still severe.  Pain levels range from 7-10. Last night pt was up every two hours and not had much rest. She wants to know if you can call in something different for her pain to [facility] [phone]. Pt requests c/b. Thank you.

## 2013-11-10 NOTE — Telephone Encounter (Signed)
Patient advised.  Rx left at front desk for pick up. 

## 2013-11-10 NOTE — Telephone Encounter (Signed)
Can try hydrocodone.  Sedation caution.  She is intolerant of codeine but this may help.  Printed. Thanks.

## 2013-11-22 ENCOUNTER — Other Ambulatory Visit: Payer: Self-pay | Admitting: Family Medicine

## 2013-11-22 NOTE — Telephone Encounter (Signed)
Pt left v/m requesting cb about status of hydrocodone rx. Pt did not leave contact #.

## 2013-11-22 NOTE — Telephone Encounter (Signed)
Received refill request electronically. Last refill 11/10/13 #20, last office visit 11/08/13. Is it okay to refill medication?

## 2013-11-22 NOTE — Telephone Encounter (Signed)
Printed.  Thanks.  

## 2013-11-22 NOTE — Telephone Encounter (Signed)
Patient notified rx is ready,  Patient's daughter will pick up rx.

## 2013-11-25 ENCOUNTER — Ambulatory Visit (INDEPENDENT_AMBULATORY_CARE_PROVIDER_SITE_OTHER): Payer: Medicare HMO | Admitting: Cardiovascular Disease

## 2013-11-25 VITALS — BP 169/83 | HR 69 | Ht 62.0 in | Wt 165.4 lb

## 2013-11-25 DIAGNOSIS — R55 Syncope and collapse: Secondary | ICD-10-CM

## 2013-11-25 DIAGNOSIS — G4730 Sleep apnea, unspecified: Secondary | ICD-10-CM

## 2013-11-25 DIAGNOSIS — I10 Essential (primary) hypertension: Secondary | ICD-10-CM

## 2013-11-25 DIAGNOSIS — E785 Hyperlipidemia, unspecified: Secondary | ICD-10-CM

## 2013-11-25 NOTE — Assessment & Plan Note (Signed)
Non recurrent no evidene of structural heart issue  ECG no conduction abnormality  Observe

## 2013-11-25 NOTE — Progress Notes (Signed)
Patient ID: [name], female   DOB: [birth year], 80 y.o.   MRN: [id number] 80 yo referred by [name] for presyncope I took care of her husband before he passed last year  She was seen 11/14 with similar episode. Was eating at restaurant and passed out for a few seconds. Has had 4 more episodes last 2 months. No palpitations , chest pain. Feels warm and flushed just prior to episodes. Has not hurt herself. Was in hospital in [place] day before [place] for similar spell She indicates that CT/MRI and "brain scan" were normal She did not have echo. No arrhythmia noted  Several episodes in past year when patient was upright and went to ground. She was presyncopal, but didn't have full LOC. No CP. The episode would pass on its own and she would 'feel washed out' for the rest of the day. No orthostatic/positional triggers. No recent changes in meds to cause the event. Last event was about 1 week ago. No CP/SOB now. No presyncopal feeling now. Compliant with meds. H/o PNA and is caring for husband who had mult medical problems. Prev with neg [facility] at [facility]. Saw Joann [name] in 2009 for dyspnea.with normal BNP and normal ECHO. Episodes sound like simple faints. No palpitatons, SSCP. Has not hurt herself. Most episodes in the am. Not related to meds and no documented bradycardia. K was high on aldactone and has been held last 7-10 days but her LE edema is worse. Looking forward to a trip to [place].  Since I last saw her no recurrence  EEG;s negative she canceled echo.  She is looking forward to grand daughters wedding [name]([name]) this weekend Joann ColderSon [name] with her today   ROS: Denies fever, malais, weight loss, blurry vision, decreased visual acuity, cough, sputum, SOB, hemoptysis, pleuritic pain, palpitaitons, heartburn, abdominal pain, melena, lower extremity edema, claudication, or rash.  All other systems reviewed and negative  General: Affect appropriate Healthy:  appears stated age HEENT:  normal Neck supple with no adenopathy JVP normal no bruits no thyromegaly Lungs clear with no wheezing and good diaphragmatic motion Heart:  S1/S2 no murmur, no rub, gallop or click PMI normal Abdomen: benighn, BS positve, no tenderness, no AAA no bruit.  No HSM or HJR Distal pulses intact with no bruits No edema Neuro non-focal Skin warm and dry No muscular weakness   Current Outpatient Prescriptions  Medication Sig Dispense Refill  . acetaminophen (TYLENOL) 500 MG tablet Take 1,000 mg by mouth at bedtime. OTC as directed      . cholecalciferol (VITAMIN D) 1000 UNITS tablet Take 1,000 Units by mouth daily.        . fluocinonide-emollient (LIDEX-E) 0.05 % cream Apply 1 application topically 2 (two) times daily. On affected areas.  30 g  0  . hydrochlorothiazide (MICROZIDE) 12.5 MG capsule Take 1 capsule (12.5 mg total) by mouth daily.  90 capsule  3  . HYDROcodone-acetaminophen (NORCO/VICODIN) 5-325 MG per tablet TAKE 1/2 TO 1 TABLET BY MOUTH EVERY 6 HOURS AS NEEDED AS DIRECTED. FOR MODERATE PAIN  20 tablet  0  . hydrOXYzine (ATARAX/VISTARIL) 10 MG tablet Take 1 to 3 tablets by mouth every 4 to 6 hours as needed for itching.      . losartan (COZAAR) 50 MG tablet Take 0.5 tablets (25 mg total) by mouth daily.      . verapamil (CALAN-SR) 240 MG CR tablet Take 1 tablet (240 mg total) by mouth at bedtime.  90 tablet  3   No current facility-administered medications for this visit.    Allergies  Codeine; Levofloxacin; Neomycin-bacitracin zn-polymyx; Neospect; Spironolactone; and Tramadol  Electrocardiogram:  11.26  SR nonspecific ST changes  No AV block or conduction abnormality  Assessment and Plan

## 2013-11-25 NOTE — Patient Instructions (Signed)
Your physician wants you to follow-up in:  6 MONTHS WITH DR NISHAN  You will receive a reminder letter in the mail two months in advance. If you don't receive a letter, please call our office to schedule the follow-up appointment. Your physician recommends that you continue on your current medications as directed. Please refer to the Current Medication list given to you today. 

## 2013-11-25 NOTE — Assessment & Plan Note (Signed)
Cholesterol is at goal.  Continue current dose of statin and diet Rx.  No myalgias or side effects.  F/U  LFT's in 6 months. Lab Results  Component Value Date   LDLCALC 102* 05/09/2011             

## 2013-11-25 NOTE — Assessment & Plan Note (Signed)
Unable to tolerate CPAP  Improved with a bit of weight loss

## 2013-11-25 NOTE — Assessment & Plan Note (Signed)
Well controlled.  Continue current medications and low sodium Dash type diet.    

## 2014-01-16 ENCOUNTER — Other Ambulatory Visit: Payer: Self-pay | Admitting: Family Medicine

## 2014-02-09 ENCOUNTER — Telehealth: Payer: Self-pay

## 2014-02-09 ENCOUNTER — Telehealth: Payer: Self-pay | Admitting: Cardiovascular Disease

## 2014-02-09 NOTE — Telephone Encounter (Signed)
New message           [name] would like to know if pt is suppose to be taking Losartan 50mg  once a day

## 2014-02-09 NOTE — Telephone Encounter (Signed)
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 Has been trying to contact pt about not getting losartan filled since 10/13/13. [name] said periodically there are gaps when pt should get losartan refilled and does not. [name] does not have good number to contact pt and pts son would not give [name] pts phone #. [name] is going to contact Dr Fabio BeringNishan's office since pt most recently seen at his office for hypertension but [name] wanted Dr Para Marchuncan to be aware of her call and wanted to know if our office would contact pt to see why she is not taking her med?Please advise.

## 2014-02-09 NOTE — Telephone Encounter (Signed)
I left a message on voicemail that the pt should be taking Losartan 50mg  one-half tablet daily per medication list.

## 2014-02-10 NOTE — Telephone Encounter (Signed)
I will await cards input first.  We can call pt if needed.

## 2014-02-11 NOTE — Telephone Encounter (Signed)
Not sure what "input" you need She is on losartan for BP and should continue it.  I know [place] to ? [place] so maybe she is getting it filled elsewhere

## 2014-02-12 NOTE — Telephone Encounter (Signed)
I didn't want LSC and cards to call the patient separately.  I'll ask staff to call and make sure she still has the rx.  If so, we'll drop the matter and disregard the insurance company.  Lugene- will you please check with patient?  Thanks.

## 2014-02-13 NOTE — Telephone Encounter (Signed)
Noted, thanks!

## 2014-02-13 NOTE — Telephone Encounter (Signed)
I spoke with Ms. Fiebig and she is taking her Losartan 0.5 tablet per day.  She said that [facility] sent a nurse out for a home visit and that nurse questioned it also but she showed her the medication and her instructions which are correct.  Apparently she was at one time taking 1 whole tablet and now that she has been switched to 0.5 tablet, there raised concern about her refills.

## 2014-04-10 ENCOUNTER — Encounter: Payer: Self-pay | Admitting: Cardiovascular Disease

## 2014-04-10 ENCOUNTER — Ambulatory Visit (INDEPENDENT_AMBULATORY_CARE_PROVIDER_SITE_OTHER): Payer: Medicare HMO | Admitting: Cardiovascular Disease

## 2014-04-10 VITALS — BP 132/80 | HR 76 | Ht 62.0 in | Wt 167.1 lb

## 2014-04-10 DIAGNOSIS — E785 Hyperlipidemia, unspecified: Secondary | ICD-10-CM

## 2014-04-10 DIAGNOSIS — R609 Edema, unspecified: Secondary | ICD-10-CM

## 2014-04-10 DIAGNOSIS — I10 Essential (primary) hypertension: Secondary | ICD-10-CM

## 2014-04-10 DIAGNOSIS — R55 Syncope and collapse: Secondary | ICD-10-CM

## 2014-04-10 NOTE — Progress Notes (Signed)
Patient ID: [name], female   DOB: 1924-03-14, 80 y.o.   MRN: [id number] 80 yo referred by Dr Para Marchuncan in June  for presyncope I took care of her husband before he passed 2 years ago  She was seen 11/14 with similar episode. Was eating at restaurant and passed out for a few seconds. Has had 4 more episodes last 2 months. No palpitations , chest pain. Feels warm and flushed just prior to episodes. Has not hurt herself. Was in hospital in [place] day before Christmas for similar spell She indicates that CT/MRI and "brain scan" were normal She did not have echo. No arrhythmia noted   Several episodes in past year when patient was upright and went to ground. She was presyncopal, but didn't have full LOC. No CP. The episode would pass on its own and she would 'feel washed out' for the rest of the day. No orthostatic/positional triggers. No recent changes in meds to cause the event. Last event was about 1 week ago. No CP/SOB now. No presyncopal feeling now. Compliant with meds. H/o PNA and is caring for husband who had mult medical problems. Prev with [facility]. Saw Dr Antoine PocheHochrein in 2009 for dyspnea.with normal BNP and normal ECHO. Episodes sound like simple faints. No palpitatons, SSCP. Has not hurt herself. Most episodes in the am. Not related to meds and no documented bradycardia.   Since I last saw her no recurrence EEG;s negative she canceled echo. She is looking forward to grand daughters wedding [name]([place]) this weekend  Steva ColderSon Nick from [place] with her today  12/14  Echo normal ef no valve disease at [facility] in [place]     ROS: Denies fever, malais, weight loss, blurry vision, decreased visual acuity, cough, sputum, SOB, hemoptysis, pleuritic pain, palpitaitons, heartburn, abdominal pain, melena, lower extremity edema, claudication, or rash.  All other systems reviewed and negative  General: Affect appropriate Healthy:  appears stated age HEENT: normal Neck supple with no  adenopathy JVP normal no bruits no thyromegaly Lungs clear with no wheezing and good diaphragmatic motion Heart:  S1/S2 no murmur, no rub, gallop or click PMI normal Abdomen: benighn, BS positve, no tenderness, no AAA no bruit.  No HSM or HJR Distal pulses intact with no bruits No edema Neuro non-focal Skin warm and dry No muscular weakness   Current Outpatient Prescriptions  Medication Sig Dispense Refill  . acetaminophen (TYLENOL) 500 MG tablet Take 1,000 mg by mouth at bedtime. OTC as directed      . cholecalciferol (VITAMIN D) 1000 UNITS tablet Take 1,000 Units by mouth daily.        . fluocinonide-emollient (LIDEX-E) 0.05 % cream Apply 1 application topically 2 (two) times daily. On affected areas.  30 g  0  . hydrochlorothiazide (MICROZIDE) 12.5 MG capsule Take 1 capsule (12.5 mg total) by mouth daily.  90 capsule  3  . hydrochlorothiazide (MICROZIDE) 12.5 MG capsule TAKE ONE CAPSULE BY MOUTH DAILY  90 capsule  2  . HYDROcodone-acetaminophen (NORCO/VICODIN) 5-325 MG per tablet TAKE 1/2 TO 1 TABLET BY MOUTH EVERY 6 HOURS AS NEEDED AS DIRECTED. FOR MODERATE PAIN  20 tablet  0  . hydrOXYzine (ATARAX/VISTARIL) 10 MG tablet Take 1 to 3 tablets by mouth every 4 to 6 hours as needed for itching.      . losartan (COZAAR) 50 MG tablet Take 0.5 tablets (25 mg total) by mouth daily.      . verapamil (CALAN-SR) 240 MG CR tablet  Take 1 tablet (240 mg total) by mouth at bedtime.  90 tablet  3  . verapamil (CALAN-SR) 240 MG CR tablet TAKE ONE TABLET BY MOUTH EVERY NIGHT AT BEDTIME  90 tablet  2   No current facility-administered medications for this visit.    Allergies  Codeine; Levofloxacin; Neomycin-bacitracin zn-polymyx; Neospect; Spironolactone; and Tramadol  Electrocardiogram: 12/14 SR [id number]   Assessment and Plan

## 2014-04-10 NOTE — Assessment & Plan Note (Signed)
Cholesterol is at goal.  Continue current dose of statin and diet Rx.  No myalgias or side effects.  F/U  LFT's in 6 months. Lab Results  Component Value Date   LDLCALC 102* 05/09/2011

## 2014-04-10 NOTE — Assessment & Plan Note (Signed)
Not recurrent no obvious cardiac etiology observe

## 2014-04-10 NOTE — Assessment & Plan Note (Signed)
Well controlled.  Continue current medications and low sodium Dash type diet.    

## 2014-04-10 NOTE — Assessment & Plan Note (Signed)
Mildl LLE dependant edema improved continue current meds

## 2014-04-10 NOTE — Patient Instructions (Signed)
Your physician wants you to follow-up in:  6 MONTHS WITH DR NISHAN  You will receive a reminder letter in the mail two months in advance. If you don't receive a letter, please call our office to schedule the follow-up appointment. Your physician recommends that you continue on your current medications as directed. Please refer to the Current Medication list given to you today. 

## 2014-04-12 ENCOUNTER — Ambulatory Visit (INDEPENDENT_AMBULATORY_CARE_PROVIDER_SITE_OTHER): Payer: Medicare HMO | Admitting: Family Medicine

## 2014-04-12 ENCOUNTER — Encounter: Payer: Self-pay | Admitting: Family Medicine

## 2014-04-12 VITALS — BP 138/68 | HR 82 | Temp 98.1°F | Wt 167.8 lb

## 2014-04-12 DIAGNOSIS — Z5320 Procedure and treatment not carried out because of patient's decision for unspecified reasons: Secondary | ICD-10-CM

## 2014-04-12 NOTE — Progress Notes (Signed)
Pre visit review using our clinic review tool, if applicable. No additional management support is needed unless otherwise documented below in the visit note.  Asking about mammogram f/u.  She got a recall letter.  She had a diagnostic mammogram with u/s prev.  She had no masses, lumps, tenderness.  No other concerns per patient.  Last mammogram was reassuring in 2014.  Prev rec from breast center was for annual screening mammography.  D/w pt about options.  She wouldn't want intervention even if an abnormality was found.  We talked about this.  She wouldn't want treatment for any abnormality.   Flu shot done 2 weeks ago at [facility].  She has f/u with cards earlier this week.    Meds, vitals, and allergies reviewed.   ROS: See HPI.  Otherwise, noncontributory.  GEN: nad, alert and oriented HEENT: mucous membranes moist NECK: supple w/o LA CV: rrr. PULM: ctab, no inc wob ABD: soft, +bs EXT: minimal/trace edema

## 2014-04-12 NOTE — Assessment & Plan Note (Signed)
She wouldn't want intervention even if an abnormality was found. We talked about this. She wouldn't want treatment for any abnormality.  This is reasonable.  Wouldn't go back for a mammogram. She agrees.

## 2014-04-12 NOTE — Patient Instructions (Signed)
Call the breast center and tell them you would like to decline the mammogram. Let me know if you have concerns.

## 2014-12-12 ENCOUNTER — Encounter: Payer: Self-pay | Admitting: Family Medicine

## 2014-12-12 ENCOUNTER — Ambulatory Visit (INDEPENDENT_AMBULATORY_CARE_PROVIDER_SITE_OTHER): Payer: Medicare HMO | Admitting: Family Medicine

## 2014-12-12 VITALS — BP 136/68 | HR 75 | Temp 97.7°F | Wt 170.8 lb

## 2014-12-12 DIAGNOSIS — I10 Essential (primary) hypertension: Secondary | ICD-10-CM | POA: Diagnosis not present

## 2014-12-12 NOTE — Progress Notes (Signed)
Pre visit review using our clinic review tool, if applicable. No additional management support is needed unless otherwise documented below in the visit note.  She is moving to [place] and has family there, has already gotten set up with a new MD there.  She has been taking some coffee and sweets each AM in the meantime and hasn't had any episodes of syncope in the meantime.  She feels well otherwise.   No CP, SOB, BLE edema.  Compliant with meds.   Meds, vitals, and allergies reviewed.   ROS: See HPI.  Otherwise, noncontributory.  nad ncat Mmm Neck supple, no LA rrr ctab abd soft Ext w/o edema

## 2014-12-12 NOTE — Patient Instructions (Addendum)
Take care.  Glad to see you today- I've always been glad to see you.   We can send your records when requested.  Good luck with the move.

## 2014-12-13 NOTE — Assessment & Plan Note (Signed)
Continue as is, she'll f/u with MD in [place].  App help of all involved.  She is a kind lady that I have always enjoyed seeing.

## 2015-01-03 ENCOUNTER — Other Ambulatory Visit: Payer: Self-pay | Admitting: Family Medicine

## 2015-01-03 NOTE — Telephone Encounter (Signed)
Please clarify with patient.  She was moving to [place] and her doc there may be covering this.  Thanks.

## 2015-01-03 NOTE — Telephone Encounter (Signed)
Received refill request electronically from pharmacy. Request from pharmacy for Cozaar does not match medication list. Is it okay to refill as requested from the pharmacy. Last office visit 12/12/14.

## 2015-11-22 ENCOUNTER — Telehealth: Payer: Self-pay | Admitting: Family Medicine

## 2015-11-22 NOTE — Telephone Encounter (Signed)
No call back number is listed.  Called home phone # but an "[name]'s" VM picks up.  Left VM to return call but not at all sure this is the correct number to reach the patient.

## 2015-11-22 NOTE — Telephone Encounter (Signed)
Please call pt and clarify the SOB to make sure she doesn't need emergent/urgent eval in the meantime.  Thanks.

## 2015-11-22 NOTE — Telephone Encounter (Signed)
Pt has appt with Dr Para Marchuncan on 11/23/15 at 2:30.

## 2015-11-22 NOTE — Telephone Encounter (Signed)
[facility] - Client TELEPHONE ADVICE RECORD [facility] Patient Name: Joann OpitzVAIL Morini DOB: 1923-09-17 Initial Comment Caller states she's having leg swelling, trouble breathing. Nurse Assessment Nurse: Debera Latalston, RN, Tinnie GensJeffrey Date/Time Lamount Cohen(Eastern Time): 11/22/2015 9:50:53 AM Confirm and document reason for call. If symptomatic, describe symptoms. You must click the next button to save text entered. ---Caller states she's having leg swelling, trouble breathing. Started past few days. Swelling in both legs around ankles. Has the patient traveled out of the country within the last 30 days? ---No Does the patient have any new or worsening symptoms? ---Yes Will a triage be completed? ---Yes Related visit to physician within the last 2 weeks? ---No Does the PT have any chronic conditions? (i.e. diabetes, asthma, etc.) ---No Is this a behavioral health or substance abuse call? ---No Guidelines Guideline Title Affirmed Question Affirmed Notes Leg Swelling and Edema [1] Thigh, calf, or ankle swelling AND [2] bilateral AND [3] 1 side is more swollen Final Disposition User See Physician within 4 Hours (or PCP triage) Debera Latalston, RN, Tinnie GensJeffrey Comments Patient did not want to be seen by another practitioner so requested appointment for tomorrow. Referrals REFERRED TO PCP OFFICE Disagree/Comply: Comply

## 2015-11-22 NOTE — Telephone Encounter (Signed)
Tried the home number again which is apparently the daughter's number and a gentleman answered and said there was no one there by the name of Rennis Hardingllis.

## 2015-11-23 ENCOUNTER — Ambulatory Visit (INDEPENDENT_AMBULATORY_CARE_PROVIDER_SITE_OTHER): Payer: Medicare HMO | Admitting: Family Medicine

## 2015-11-23 ENCOUNTER — Encounter: Payer: Self-pay | Admitting: Family Medicine

## 2015-11-23 VITALS — BP 138/68 | HR 86 | Temp 97.6°F | Wt 187.5 lb

## 2015-11-23 DIAGNOSIS — R609 Edema, unspecified: Secondary | ICD-10-CM | POA: Diagnosis not present

## 2015-11-23 DIAGNOSIS — R0602 Shortness of breath: Secondary | ICD-10-CM

## 2015-11-23 LAB — COMPREHENSIVE METABOLIC PANEL
ALT: 14 U/L (ref 0–35)
AST: 14 U/L (ref 0–37)
Albumin: 3.8 g/dL (ref 3.5–5.2)
Alkaline Phosphatase: 87 U/L (ref 39–117)
BILIRUBIN TOTAL: 0.3 mg/dL (ref 0.2–1.2)
BUN: 17 mg/dL (ref 6–23)
CALCIUM: 9.3 mg/dL (ref 8.4–10.5)
CHLORIDE: 103 meq/L (ref 96–112)
CO2: 29 meq/L (ref 19–32)
Creatinine, Ser: 0.79 mg/dL (ref 0.40–1.20)
GFR: 72.65 mL/min (ref >60.00)
GLUCOSE: 97 mg/dL (ref 70–99)
Potassium: 3.9 mEq/L (ref 3.5–5.1)
Sodium: 140 mEq/L (ref 135–145)
Total Protein: 6.5 g/dL (ref 6.0–8.3)

## 2015-11-23 LAB — CBC WITH DIFFERENTIAL/PLATELET
Basophils Absolute: 75 cells/uL (ref 0–200)
Basophils Relative: 1 %
EOS PCT: 6 %
Eosinophils Absolute: 450 cells/uL (ref 15–500)
HCT: 35.7 % (ref 35.0–45.0)
Hemoglobin: 11.1 g/dL — ABNORMAL LOW (ref 11.7–15.5)
LYMPHS PCT: 26 %
Lymphs Abs: 1950 cells/uL (ref 850–3900)
MCH: 26.7 pg — AB (ref 27.0–33.0)
MCHC: 31.1 g/dL — ABNORMAL LOW (ref 32.0–36.0)
MCV: 85.8 fL (ref 80.0–100.0)
MONOS PCT: 14 %
MPV: 9.4 fL (ref 7.5–12.5)
Monocytes Absolute: 1050 cells/uL — ABNORMAL HIGH (ref 200–950)
NEUTROS ABS: 3975 {cells}/uL (ref 1500–7800)
Neutrophils Relative %: 53 %
PLATELETS: 383 10*3/uL (ref 140–400)
RBC: 4.16 MIL/uL (ref 3.80–5.10)
RDW: 14.4 % (ref 11.0–15.0)
WBC: 7.5 10*3/uL (ref 3.8–10.8)

## 2015-11-23 MED ORDER — FUROSEMIDE 20 MG PO TABS: 20.0000 mg | ORAL_TABLET | Freq: Every day | ORAL | Status: DC

## 2015-11-23 NOTE — Patient Instructions (Addendum)
Go to the lab on the way out.  We'll contact you with your lab report. For now, take 20mg  lasix daily.  Take first dose this afternoon, then take in the morning.  If the swelling is a lot better, then skip the lasix that day.  Update me Monday.  Take care.  Glad to see you.

## 2015-11-23 NOTE — Progress Notes (Signed)
Pre visit review using our clinic review tool, if applicable. No additional management support is needed unless otherwise documented below in the visit note.  She is up visiting from [place], she'll likely be back and forth in the future.    New issue.  B foot swelling for about 2 months.  Had been elevating her feet periodically with transient relief.  This AM/today not SOB.  She gets more SOB if laying flat.  Sleeping in a chair or propped up is easier for her.  Does have some SOB with exertion, over the last 2 months.  No vomiting, no diarrhea.  No chest pain.  Still with nocturia, q2 hours.    Recently with URI sx but some better recently/today.  Sx started with rhinorrhea and cough about 2 weeks.  Prev sick contacts noted at home.    Has been off HCTZ for about 6 months.   PMH and SH reviewed  ROS: Per HPI unless specifically indicated in ROS section   Meds, vitals, and allergies reviewed.   GEN: nad, alert and oriented HEENT: mucous membranes moist NECK: supple w/o LA CV: rrr PULM: ctab, no inc wob ABD: soft, +bs EXT: 2+ BLE edema but not weeping.  SKIN: no acute rash

## 2015-11-24 LAB — BRAIN NATRIURETIC PEPTIDE: BRAIN NATRIURETIC PEPTIDE: 470.9 pg/mL — AB (ref <100)

## 2015-11-25 ENCOUNTER — Encounter: Payer: Self-pay | Admitting: Family Medicine

## 2015-11-25 NOTE — Assessment & Plan Note (Signed)
Has been off HCTZ for about 6 months, presumed heart failure from h/o longstanding HTN, but w/o CP and still okay for outpatient f/u.  Would stay off HCTZ for now, but start taking lasix qd prn and check labs today.  D/w pt about routine cautions, esp limiting salt.  Will get update from patient on Monday.  See notes on labs.  She doesn't need ER eval at time of the OV.  We'll see about echo and cards eval when I get update on patient Monday.

## 2015-11-26 ENCOUNTER — Telehealth (INDEPENDENT_AMBULATORY_CARE_PROVIDER_SITE_OTHER): Payer: Medicare HMO

## 2015-11-26 DIAGNOSIS — R0602 Shortness of breath: Secondary | ICD-10-CM | POA: Diagnosis not present

## 2015-11-26 MED ORDER — FUROSEMIDE 20 MG PO TABS: 40.0000 mg | ORAL_TABLET | Freq: Every day | ORAL | Status: DC

## 2015-11-26 NOTE — Telephone Encounter (Signed)
Patient advised. Patient prefers to call back for FU appt this week once she checks with her transportation.  Instructions given for Lasix and patient voiced understanding.

## 2015-11-26 NOTE — Telephone Encounter (Signed)
Pt called to report swelling in both legs is slightly better today; pt is SOB for one month; pt is SOB on phone while talking.pt said no more SOB than usual. No pain anywhere. Is difficult to get around due to SOB. Pt started the Lasix on 11/24/15 and has taken the Lasix daily. [place]. Pt request cb with Dr Lianne Bushyuncan's instruction.

## 2015-11-26 NOTE — Telephone Encounter (Signed)
I would get the echo done.  Ordered.  In meantime, up the lasix to 40mg  a day (20mg  x2 in the AM, okay to take second pills this AM if already took one). Recheck BMET this week at [facility].   bmet order in.  If swelling much better in the meantime, then okay to cut back to 20mg  x1 lasix in the AM.  Thanks.  Med list updated.

## 2015-11-26 NOTE — Telephone Encounter (Signed)
No answer, no VM set up.  Will try again later.

## 2015-11-27 ENCOUNTER — Encounter: Payer: Self-pay | Admitting: Family Medicine

## 2015-11-27 ENCOUNTER — Ambulatory Visit (INDEPENDENT_AMBULATORY_CARE_PROVIDER_SITE_OTHER): Payer: Medicare HMO | Admitting: Family Medicine

## 2015-11-27 VITALS — BP 158/84 | HR 78 | Temp 97.4°F | Wt 184.5 lb

## 2015-11-27 DIAGNOSIS — R609 Edema, unspecified: Secondary | ICD-10-CM

## 2015-11-27 MED ORDER — FUROSEMIDE 20 MG PO TABS: 20.0000 mg | ORAL_TABLET | Freq: Two times a day (BID) | ORAL | Status: DC

## 2015-11-27 NOTE — Patient Instructions (Signed)
Go to the lab on the way out.  We'll contact you with your lab report. Lasix 20mg  twice a day for now.   We'll update you when I get the labs back.  We'll try to move the echo to [facility].   Take care.  Glad to see you.  Keep limiting salt in the meantime.

## 2015-11-27 NOTE — Progress Notes (Signed)
Pre visit review using our clinic review tool, if applicable. No additional management support is needed unless otherwise documented below in the visit note.  Presumed CHF with elevated BNP noted.  Prev labs d/w pt at [facility], esp BNP and Cr/K.  Her breathing isn't worse, is slightly better but not back to baseline.  Recently on 40mg  lasix a day.  Responded to 20mg  a day, with inc UOP.  Not much more UOP per dose of 40mg  at once.  Some dec in BLE edema.  Still with some SOB laying down flat.  Has been sleeping in a chair that allows her to put her feet up.   No CP.    Meds, vitals, and allergies reviewed.   ROS: Per HPI unless specifically indicated in ROS section   nad ncat Mmm Neck supple, no LA rrr ctab abd soft, not ttp  Dec BLE edema compared to last exam but not resolved.  She has L>R leg edema at baseline over the last few months.

## 2015-11-28 LAB — BASIC METABOLIC PANEL
BUN: 14 mg/dL (ref 6–23)
CALCIUM: 9 mg/dL (ref 8.4–10.5)
CO2: 30 mEq/L (ref 19–32)
Chloride: 102 mEq/L (ref 96–112)
Creatinine, Ser: 0.71 mg/dL (ref 0.40–1.20)
GFR: 82.18 mL/min (ref >60.00)
Glucose, Bld: 86 mg/dL (ref 70–99)
POTASSIUM: 3.7 meq/L (ref 3.5–5.1)
Sodium: 139 mEq/L (ref 135–145)

## 2015-11-28 NOTE — Assessment & Plan Note (Signed)
Echo pending, we'll try to get that moved up and done in [facility].  Lasix 20mg  twice a day for now, since she didn't notice a big change with 40mb per dose.  Labs d/w pt, repeat BMET pending, see notes on labs.   >25 minutes spent in face to face time with patient, >50% spent in counselling or coordination of care.  Path/phys d/w pt.  Will notify Dr. Eden EmmsNishan as a FYI as he is familiar with patient and per patient request.   CP free.  Still okay for outpatient f/u.

## 2015-11-30 ENCOUNTER — Encounter (HOSPITAL_COMMUNITY): Payer: Self-pay | Admitting: *Deleted

## 2015-11-30 ENCOUNTER — Other Ambulatory Visit: Payer: Self-pay

## 2015-11-30 ENCOUNTER — Encounter: Payer: Self-pay | Admitting: Physician Assistant

## 2015-11-30 ENCOUNTER — Ambulatory Visit (HOSPITAL_COMMUNITY): Payer: Medicare HMO | Attending: Cardiology

## 2015-11-30 ENCOUNTER — Other Ambulatory Visit (HOSPITAL_COMMUNITY): Payer: Self-pay | Admitting: Cardiology

## 2015-11-30 ENCOUNTER — Ambulatory Visit (INDEPENDENT_AMBULATORY_CARE_PROVIDER_SITE_OTHER): Payer: Medicare HMO | Admitting: Physician Assistant

## 2015-11-30 VITALS — BP 140/82 | HR 78 | Ht 62.0 in | Wt 185.4 lb

## 2015-11-30 DIAGNOSIS — Z8249 Family history of ischemic heart disease and other diseases of the circulatory system: Secondary | ICD-10-CM | POA: Insufficient documentation

## 2015-11-30 DIAGNOSIS — G4733 Obstructive sleep apnea (adult) (pediatric): Secondary | ICD-10-CM | POA: Insufficient documentation

## 2015-11-30 DIAGNOSIS — I253 Aneurysm of heart: Secondary | ICD-10-CM | POA: Diagnosis not present

## 2015-11-30 DIAGNOSIS — I059 Rheumatic mitral valve disease, unspecified: Secondary | ICD-10-CM | POA: Insufficient documentation

## 2015-11-30 DIAGNOSIS — R29898 Other symptoms and signs involving the musculoskeletal system: Secondary | ICD-10-CM | POA: Diagnosis not present

## 2015-11-30 DIAGNOSIS — R0602 Shortness of breath: Secondary | ICD-10-CM | POA: Diagnosis not present

## 2015-11-30 DIAGNOSIS — I371 Nonrheumatic pulmonary valve insufficiency: Secondary | ICD-10-CM | POA: Diagnosis not present

## 2015-11-30 DIAGNOSIS — I071 Rheumatic tricuspid insufficiency: Secondary | ICD-10-CM | POA: Diagnosis not present

## 2015-11-30 DIAGNOSIS — I10 Essential (primary) hypertension: Secondary | ICD-10-CM

## 2015-11-30 DIAGNOSIS — E785 Hyperlipidemia, unspecified: Secondary | ICD-10-CM | POA: Diagnosis not present

## 2015-11-30 DIAGNOSIS — Z87891 Personal history of nicotine dependence: Secondary | ICD-10-CM | POA: Insufficient documentation

## 2015-11-30 DIAGNOSIS — I5041 Acute combined systolic (congestive) and diastolic (congestive) heart failure: Secondary | ICD-10-CM

## 2015-11-30 DIAGNOSIS — R55 Syncope and collapse: Secondary | ICD-10-CM

## 2015-11-30 DIAGNOSIS — I119 Hypertensive heart disease without heart failure: Secondary | ICD-10-CM | POA: Insufficient documentation

## 2015-11-30 DIAGNOSIS — R0600 Dyspnea, unspecified: Secondary | ICD-10-CM | POA: Diagnosis present

## 2015-11-30 MED ORDER — FUROSEMIDE 40 MG PO TABS: 40.0000 mg | ORAL_TABLET | Freq: Two times a day (BID) | ORAL | Status: DC

## 2015-11-30 MED ORDER — CARVEDILOL 6.25 MG PO TABS: 6.2500 mg | ORAL_TABLET | Freq: Two times a day (BID) | ORAL | Status: DC

## 2015-11-30 MED ORDER — ASPIRIN EC 81 MG PO TBEC: 81.0000 mg | DELAYED_RELEASE_TABLET | Freq: Every day | ORAL | Status: DC

## 2015-11-30 MED ORDER — PERFLUTREN LIPID MICROSPHERE
1.0000 mL | INTRAVENOUS | Status: AC | PRN
  Administered 2015-11-30: 2 mL via INTRAVENOUS

## 2015-11-30 NOTE — Progress Notes (Signed)
Notify pt.  I saw the echo results and the cardiology note.  I appreciate the help of all involved and agree with cardiology recs.  Thanks.

## 2015-11-30 NOTE — Progress Notes (Addendum)
Tried to phone patient, no answer, no VM set up.  Will try again later.  Patient phoned back and advised.

## 2015-11-30 NOTE — Progress Notes (Signed)
Patient ID: Joann KindredVail H Hudson, female   DOB: Nov 07, 1923, 82 y.o.   MRN: [id number]  Left Ventricular Apical Aneurysm present, not identified in prior testing.  Definity was used to rule out thrombus.  Dr. Tenny Crawoss (DOD) was notified, and [name] was scheduled to be seen in the flex clinic immediately following the echo.    Farrel ConnersBethany Claudie Rathbone, RDCS

## 2015-11-30 NOTE — Progress Notes (Signed)
Cardiology Office Note    Date:  11/30/2015   ID:  [name], DOB Oct 17, 1923, MRN [id number]  PCP:  Crawford Givens, MD  Cardiologist:  Dr. Eden Emms  Chief Complaint: SOB and abnormal echo  History of Present Illness:   [name] is a 82 y.o. female HTN, syncope, HLD,  Sleep apnea who added to schedule today for abnormal echo.   Prev with neg cath at [facility]. Followed by Dr. Eden Emms for presyncope/syncope while eating  (last seen 04/10/2014) --> her episodes sounded like faint. Not related to meds and no documented bradycardia. No obvious etiology identified. No episode since then. Echo 12/2014showed normal ef (55-60%), no valve disease at [facility] in [place].  She is up visiting from [place], she'll likely be back and forth in the future. Recently recovered from URI about 6 weeks ago and treated with abx at [facility]. She has PCP over there, no cardiologist. Flew here around Mother's day.  Noted LE edema with SOB since then. Had been elevating her feet periodically with transient relief. More dyspnic with laying flat. Sleeping in chair helps. Her by her PCP here and started on lasix 20md qd, BNP of 470. Scr normal. Currently taking lasix  BID. Little improvement of symptoms. Still has orthopnea, PND and LE edema and dyspnea on exertion. Belly feels tight. No chest pain, palpitation, dizziness, syncope. Recently eating more restaurant food. Compliant with diet.  She has gained about 27lb in past month.     Past Medical History  Diagnosis Date  . Hyperlipidemia   . GERD (gastroesophageal reflux disease)   . Sleep apnea   . Overactive bladder   . Kidney stones   . Hypertension   . Allergy   . Chronic dermatitis     face, allergic to many products  . AK (actinic keratosis)   . Syncope     Past Surgical History  Procedure Laterality Date  . Broken nose    . Colonoscopy  2004    Current Medications: Prior to Admission medications   Medication Sig Start Date  End Date Taking? Authorizing Provider  acetaminophen (TYLENOL) 500 MG tablet Take 1,000 mg by mouth at bedtime. OTC as directed    Historical Provider, MD  cholecalciferol (VITAMIN D) 1000 UNITS tablet Take 1,000 Units by mouth daily.      Historical Provider, MD  ferrous sulfate 325 (65 FE) MG tablet Take 325 mg by mouth daily with breakfast.    Historical Provider, MD  fluocinonide-emollient (LIDEX-E) 0.05 % cream Apply 1 application topically 2 (two) times daily. On affected areas. 05/25/13   Joaquim Nam, MD  furosemide (LASIX) 20 MG tablet Take 1 tablet (20 mg total) by mouth 2 (two) times daily. 11/27/15   Joaquim Nam, MD  HYDROcodone-acetaminophen (NORCO/VICODIN) 5-325 MG per tablet TAKE 1/2 TO 1 TABLET BY MOUTH EVERY 6 HOURS AS NEEDED AS DIRECTED. FOR MODERATE PAIN 11/22/13   Joaquim Nam, MD  hydrOXYzine (ATARAX/VISTARIL) 10 MG tablet Take 1 to 3 tablets by mouth every 4 to 6 hours as needed for itching.    Historical Provider, MD  losartan (COZAAR) 50 MG tablet Take 0.5 tablets (25 mg total) by mouth daily. 05/24/13   Joaquim Nam, MD  verapamil (CALAN-SR) 240 MG CR tablet TAKE ONE TABLET BY MOUTH EVERY NIGHT AT BEDTIME 01/03/15   Joaquim Nam, MD    Allergies:   Codeine; Levofloxacin; Neomycin-bacitracin zn-polymyx; Neospect; Spironolactone; and Tramadol   Social History  Social History  . Marital Status: Married    Spouse Name: N/A  . Number of Children: N/A  . Years of Education: N/A   Social History Main Topics  . Smoking status: Former Smoker    Types: Cigarettes  . Smokeless tobacco: Never Used     Comment: smoked during college  . Alcohol Use: No  . Drug Use: No  . Sexual Activity: Not Asked   Other Topics Concern  . None   Social History Narrative   Widowed 2014 -husband had coronary artery disease and valvular disease   Has grand children and great grandchildren     Family History:  The patient's family history includes Cancer in her mother;  Coronary artery disease in her father. There is no history of Colon cancer.   ROS:   Please see the history of present illness.    ROS All other systems reviewed and are negative.   PHYSICAL EXAM:   VS:  BP 140/82 mmHg  Pulse 78  Ht 5\' 2"  (1.575 m)  Wt 185 lb 6.4 oz (84.097 kg)  BMI 33.90 kg/m2   GEN: Well nourished, well developed, in no acute distress HEENT: normal Neck: no JVD, carotid bruits, or masses Cardiac: RRR; no murmurs, rubs, or gallops, 2+ BL LE edema  Respiratory:   normal work of breathing, Faint bibasilar rales GI: soft, nontender, minimally distended + BS MS: no deformity or atrophy Skin: warm and dry, no rash Neuro:  Alert and Oriented x 3, Strength and sensation are intact Psych: euthymic mood, full affect  Wt Readings from Last 3 Encounters:  11/30/15 185 lb 6.4 oz (84.097 kg)  11/27/15 184 lb 8 oz (83.689 kg)  11/23/15 187 lb 8 oz (85.049 kg)      Studies/Labs Reviewed:   EKG:  EKG is ordered today.  The ekg ordered today demonstrates sinus rhythm at rate of 71 bpm, Q wave in anterior inferior lead. TWI in lateral lead  Recent Labs: 11/23/2015: ALT 14; Brain Natriuretic Peptide 470.9*; Hemoglobin 11.1*; Platelets 383 11/27/2015: BUN 14; Creatinine, Ser 0.71; Potassium 3.7; Sodium 139   Lipid Panel    Component Value Date/Time   CHOL 183 05/09/2011 0955   TRIG 103.0 05/09/2011 0955   HDL 60.90 05/09/2011 0955   CHOLHDL 3 05/09/2011 0955   VLDL 20.6 05/09/2011 0955   LDLCALC 102* 05/09/2011 0955    Additional studies/ records that were reviewed today include:   As above.   Reviewed todays echo images with Dr. Tenny Crawoss. Pending formal reading.     ASSESSMENT & PLAN:    1. Acute systolic heart failure - Echo showed EF around 34%, pending formal reading. Likely due to silent ischemic event. Her EF was normal 05/2013. Dr. Tenny Crawoss has reviewed images with patient and daughter. Discussed different treatment options. The patient does not wants to proceed  with any ischemic evaluation. Dr. Tenny Crawoss agrees with plan to manage medically for heart failure. She is going back to [facility] [place] [facility] [place]. She will establish care with cardiologist over there. Will discontinue Verapamil given acute CHF. Start   ASA 81mg  and coreg 6.25mg  BID. Increase Lasix to 40mg  BID. Reviewed recent lab. Recheck Tuesday 6/13 with APP while Dr. Tenny Crawoss is here (she will also see the patient.) CBC, BNP and BMP at f/u visit.  She will benefits from BelfonteEntresto in future.   2. HTN - Stable and well controlled. As above.   3. Hx of syncope - No further episode.   Dr. Tenny Crawoss and  I have spent over 45 minutes, face-to-face with the patient and over 50% was spent in counseling and/or coordination of care. This includes review of prior records, care discussion with DOD (Dr. Tenny Craw) and patient,  developing & discussing different plans, discussion of disease and its complications, life style changes.    Medication Adjustments/Labs and Tests Ordered: Current medicines are reviewed at length with the patient today.  Concerns regarding medicines are outlined above.  Medication changes, Labs and Tests ordered today are listed in the Patient Instructions below. Patient Instructions  Medication Instructions:  Your physician has recommended you make the following change in your medication:  1.  INCREASE the Lasix to 40 mg taking 1 tablet twice a day (2 of the 20 mg tablets twice a day) 2.  STOP the Verapami 3.  START Coreg 6.25 mg taking 1 tablet twice a day 4.  START Aspirin 81 mg take 1 daily  Labwork: 12/04/15:  BNP, BMET, & CBC  Testing/Procedures: None ordered  Follow-Up: Your physician recommends that you schedule a follow-up appointment in:   12/04/15 WITH KATIE THOMPSON, PA-C ARRIVE AT 8:45   Any Other Special Instructions Will Be Listed Below (If Applicable).    If you need a refill on your cardiac medications before your next appointment, please call your pharmacy.        Lorelei Pont, [place]  11/30/2015 11:27 AM    [facility] [address] [place], [place], [place]  [address] Phone: [phone]; Fax: [phone]

## 2015-11-30 NOTE — Patient Instructions (Addendum)
Medication Instructions:  Your physician has recommended you make the following change in your medication:  1.  INCREASE the Lasix to 40 mg taking 1 tablet twice a day (2 of the 20 mg tablets twice a day) 2.  STOP the Verapami 3.  START Coreg 6.25 mg taking 1 tablet twice a day 4.  START Aspirin 81 mg take 1 daily  Labwork: 12/04/15:  BNP, BMET, & CBC  Testing/Procedures: None ordered  Follow-Up: Your physician recommends that you schedule a follow-up appointment in:   12/04/15 WITH KATIE THOMPSON, PA-C ARRIVE AT 8:45   Any Other Special Instructions Will Be Listed Below (If Applicable).    If you need a refill on your cardiac medications before your next appointment, please call your pharmacy.

## 2015-12-03 ENCOUNTER — Telehealth: Payer: Self-pay | Admitting: Internal Medicine

## 2015-12-03 NOTE — Progress Notes (Signed)
Cardiology Office Note    Date:  12/04/2015   ID:  [name], DOB 03-01-1924, MRN [id number]  PCP:  Joann Givens, MD  Cardiologist: Dr. Eden Emms   Follow up CHF  History of Present Illness:  [name] is a 82 y.o. female with a history of HTN, syncope, HLD, OSA and recently diasgnosed systolic CHF who presents to clinic for follow up.   Prev with neg cath at [facility]. Followed by Dr. Eden Emms for presyncope/syncope while eating (last seen 04/10/2014) --> her episodes sounded like faint. Not related to meds and no documented bradycardia. No obvious etiology identified. No episode since then. Echo 12/2014showed normal ef (55-60%), no valve disease at [facility] in [place].  She lives in [place] and [place] part time. She recently noticed a ~27 lbs weight gain and LE edema. She was seen by PCP here who ordered a 2D ECHO and increased lasix from 20mg  daily to 20mg  BID.   She was then added on to [employer] PA-C's schedule for evaluation of new onset CHF. ECHO with new low EF ~30-35%  new wall motion abnormalities with akinesis and aneurysmal dilitation of the apex Basal portions of LV normal No evid of thrombus. Overall consistent with silent MI with resultant CHF. Dr. Tenny Craw also saw patient. She had a long discussion with patient and family and decided to proceed with conservative treatment given her age. No ischemic eval will be persued. Her CCB was stopped and she was placed on Coreg. Lasix increased to 40mg  BID and ASA 81 mg daily was added. Weight was 185 lbs. Of note, "pre thrombotic sludge" noted at LV Apex on echo as well.   Today she presents to clinic for follow up. She has been watching her salt but not weighing daily ( no scale at her daughters house). She is down 4 lbs by our scales. She still has about ~10 to go. She says her dry weight is ~167-170 lbs. Still has some LE edema and abdominal distension, although both of these should improved. SOB not much improved.  No chest pain. No syncope but she has some dizziness this AM while getting ready. She sleeps on one pillow but sometimes ends up in her recliner when she gets [place] to go to the bathroom. No PND.    Past Medical History  Diagnosis Date  . Hyperlipidemia   . GERD (gastroesophageal reflux disease)   . Sleep apnea   . Overactive bladder   . Kidney stones   . Hypertension   . Allergy   . Chronic dermatitis     face, allergic to many products  . AK (actinic keratosis)   . Syncope     Past Surgical History  Procedure Laterality Date  . Broken nose    . Colonoscopy  2004    Current Medications: Outpatient Prescriptions Prior to Visit  Medication Sig Dispense Refill  . acetaminophen (TYLENOL) 500 MG tablet Take 1,000 mg by mouth at bedtime. OTC as directed    . aspirin EC 81 MG tablet Take 1 tablet (81 mg total) by mouth daily. 90 tablet 3  . carvedilol (COREG) 6.25 MG tablet Take 1 tablet (6.25 mg total) by mouth 2 (two) times daily with a meal. 180 tablet 3  . cholecalciferol (VITAMIN D) 1000 UNITS tablet Take 1,000 Units by mouth daily.      . ferrous sulfate 325 (65 FE) MG tablet Take 325 mg by mouth daily with breakfast.    . fluocinonide-emollient (  LIDEX-E) 0.05 % cream Apply 1 application topically 2 (two) times daily. On affected areas. 30 g 0  . furosemide (LASIX) 40 MG tablet Take 1 tablet (40 mg total) by mouth 2 (two) times daily. 60 tablet 1  . HYDROcodone-acetaminophen (NORCO/VICODIN) 5-325 MG per tablet TAKE 1/2 TO 1 TABLET BY MOUTH EVERY 6 HOURS AS NEEDED AS DIRECTED. FOR MODERATE PAIN 20 tablet 0  . losartan (COZAAR) 50 MG tablet Take 0.5 tablets (25 mg total) by mouth daily.    . hydrOXYzine (ATARAX/VISTARIL) 10 MG tablet Take 1 to 3 tablets by mouth every 4 to 6 hours as needed for itching.     No facility-administered medications prior to visit.     Allergies:   Codeine; Levofloxacin; Neomycin-bacitracin zn-polymyx; Neospect; Spironolactone; and Tramadol   Social  History   Social History  . Marital Status: Married    Spouse Name: N/A  . Number of Children: N/A  . Years of Education: N/A   Social History Main Topics  . Smoking status: Former Smoker    Types: Cigarettes  . Smokeless tobacco: Never Used     Comment: smoked during college  . Alcohol Use: No  . Drug Use: No  . Sexual Activity: Not Asked   Other Topics Concern  . None   Social History Narrative   Widowed 2014 -husband had coronary artery disease and valvular disease   Has grand children and great grandchildren     Family History:  The patient's family history includes Cancer in her mother; Coronary artery disease in her father. There is no history of Colon cancer.   ROS:   Please see the history of present illness.    ROS All other systems reviewed and are negative.   PHYSICAL EXAM:   VS:  BP 124/62 mmHg  Pulse 82  Ht 5\' 2"  (1.575 m)  Wt 181 lb (82.101 kg)  BMI 33.10 kg/m2  SpO2 92%   GEN: Well nourished, well developed, in no acute distress HEENT: normal Neck: no JVD, carotid bruits, or masses Cardiac: RRR; no murmurs, rubs, or gallops, 2+ LE edema  Respiratory:  clear to auscultation bilaterally, normal work of breathing GI: soft, nontender, nondistended, + BS  MS: no deformity or atrophy Skin: warm and dry, no rash Neuro:  Alert and Oriented x 3, Strength and sensation are intact Psych: euthymic mood, full affect  Wt Readings from Last 3 Encounters:  12/04/15 181 lb (82.101 kg)  11/30/15 185 lb 6.4 oz (84.097 kg)  11/27/15 184 lb 8 oz (83.689 kg)      Studies/Labs Reviewed:   EKG:  EKG is NOTordered today.     Recent Labs: 11/23/2015: ALT 14; Brain Natriuretic Peptide 470.9*; Hemoglobin 11.1*; Platelets 383 11/27/2015: BUN 14; Creatinine, Ser 0.71; Potassium 3.7; Sodium 139   Lipid Panel    Component Value Date/Time   CHOL 183 05/09/2011 0955   TRIG 103.0 05/09/2011 0955   HDL 60.90 05/09/2011 0955   CHOLHDL 3 05/09/2011 0955   VLDL 20.6  05/09/2011 0955   LDLCALC 102* 05/09/2011 0955    Additional studies/ records that were reviewed today include:  2D ECHO: 11/30/2015 LV EF: 30% - 35% Study Conclusions - Left ventricle: The cavity size was normal. Systolic function was  moderately to severely reduced. The estimated ejection fraction  was in the range of 30% to 35%. Doppler parameters are consistent  with abnormal left ventricular relaxation (grade 1 diastolic  dysfunction). - Aortic valve: Trileaflet; normal thickness leaflets. There was  no  regurgitation. - Aortic root: The aortic root was normal in size. - Ascending aorta: The ascending aorta was normal in size. - Mitral valve: Calcified annulus. Mildly thickened leaflets . - Left atrium: The atrium was normal in size. - Right ventricle: The cavity size was mildly dilated. Wall  thickness was normal. Systolic function was normal. - Tricuspid valve: There was mild regurgitation. - Pulmonic valve: There was trivial regurgitation. - Pulmonary arteries: Systolic pressure was mildly increased. PA  peak pressure: 34 mm Hg (S). - Inferior vena cava: The vessel was normal in size. The  respirophasic diameter changes were blunted (< 50%), consistent  with elevated central venous pressure. - Pericardium, extracardiac: There was no pericardial effusion. Impressions: - When compared to the prior study from 2010 there is now  significant change.  There is akinesis and aneurysmal dilatation of all of the apical  segments and mid anteroseptal, anterior and anterolateral walls.  There is no obvious thrombus but heavy blood layering (sludge) in  the LV apex consistent with a pre-thrombotic state.     ASSESSMENT & PLAN:   Acute on chronic S CHF: felt to likely be from silent ischemic event. No further ischemic eval given age and patients wish not to. Continue ASA.  -- Weight today 181 down from 185 lbs. She says her dry weight is 167-170 lbs.  Volume better  but still not euvolemic. Will check BMET today and next Wednesday as well. Continue lasix  BID for now  -- Continue BB and Losartan. Consider adding Entresto in CHF clinic in [place].  Echo with apical aneurysm . Contrast with layering  No obvious thrombus.  WIll review CBC today and renal function   If OK would switch off of aspirin to apixiban 2.5 mg bid  Labs in 1 wk    HTN: BP well controlled currently.   Hx of syncope: no further episodes of this.   Medication Adjustments/Labs and Tests Ordered: Current medicines are reviewed at length with the patient today.  Concerns regarding medicines are outlined above.  Medication changes, Labs and Tests ordered today are listed in the Patient Instructions below. Patient Instructions  Medication Instructions:  Your physician recommends that you continue on your current medications as directed. Please refer to the Current Medication list given to you today.   Labwork: TODAY:  CBC & BMET 12/12/15:  CBC & BMET Testing/Procedures: None ordered  Follow-Up: Your physician recommends that you schedule a follow-up appointment in: 12/12/15 WITH A NURSE TO HAVE BP RECHECKED   Any Other Special Instructions Will Be Listed Below (If Applicable).     If you need a refill on your cardiac medications before your next appointment, please call your pharmacy.       Signed, Cline Crock, PA-C  12/04/2015 10:01 AM    [facility] [address] [place], [place], [place]  [address] Phone: [phone]; Fax: [phone]   Pt seen and examined  I agree with findings as noted above   I agree with findings of K Raegyn Renda  I saw pt last week  She has mild improvement in exam  Lungs remain clear.  Cardiac exam RRR  No S3  Ext with 2+ edema  Not firm I would recomm continuing lasix   Check labs WIll recheck CBC  With echo findings would prob switch to low dose eliquis to prevent thrombus formation.  Will contact pt F/U next wk I  have contacted [facility]  They will contact  pt for f/u  Dietrich PatesPaula Ross

## 2015-12-03 NOTE — Telephone Encounter (Signed)
New message      Pt had an echo last Friday. She also saw Dr Tenny Crawoss.  Dr Tenny Crawoss is referring pt to a doctor in [place] where she lives with her daughter.  They will not let pt make a self referral appt.    Please call and refer pt to [facility] at [phone].  Please fax notes and echo report to [phone].  If any problems, please call daughter.

## 2015-12-04 ENCOUNTER — Ambulatory Visit (INDEPENDENT_AMBULATORY_CARE_PROVIDER_SITE_OTHER): Payer: Medicare HMO | Admitting: Physician Assistant

## 2015-12-04 ENCOUNTER — Other Ambulatory Visit (INDEPENDENT_AMBULATORY_CARE_PROVIDER_SITE_OTHER): Payer: Medicare HMO | Admitting: *Deleted

## 2015-12-04 ENCOUNTER — Encounter: Payer: Self-pay | Admitting: Physician Assistant

## 2015-12-04 VITALS — BP 124/62 | HR 82 | Ht 62.0 in | Wt 181.0 lb

## 2015-12-04 DIAGNOSIS — I5041 Acute combined systolic (congestive) and diastolic (congestive) heart failure: Secondary | ICD-10-CM

## 2015-12-04 DIAGNOSIS — I10 Essential (primary) hypertension: Secondary | ICD-10-CM

## 2015-12-04 DIAGNOSIS — R0602 Shortness of breath: Secondary | ICD-10-CM | POA: Diagnosis not present

## 2015-12-04 DIAGNOSIS — R55 Syncope and collapse: Secondary | ICD-10-CM | POA: Diagnosis not present

## 2015-12-04 LAB — CBC
HEMATOCRIT: 35.7 % (ref 35.0–45.0)
Hemoglobin: 11.5 g/dL — ABNORMAL LOW (ref 11.7–15.5)
MCH: 26.5 pg — ABNORMAL LOW (ref 27.0–33.0)
MCHC: 32.2 g/dL (ref 32.0–36.0)
MCV: 82.3 fL (ref 80.0–100.0)
MPV: 10.1 fL (ref 7.5–12.5)
PLATELETS: 277 10*3/uL (ref 140–400)
RBC: 4.34 MIL/uL (ref 3.80–5.10)
RDW: 14.6 % (ref 11.0–15.0)
WBC: 7.7 10*3/uL (ref 3.8–10.8)

## 2015-12-04 LAB — BASIC METABOLIC PANEL
BUN: 19 mg/dL (ref 7–25)
CO2: 28 mmol/L (ref 20–31)
CREATININE: 0.91 mg/dL — AB (ref 0.60–0.88)
Calcium: 8.7 mg/dL (ref 8.6–10.4)
Chloride: 100 mmol/L (ref 98–110)
Glucose, Bld: 96 mg/dL (ref 65–99)
Potassium: 3.4 mmol/L — ABNORMAL LOW (ref 3.5–5.3)
Sodium: 140 mmol/L (ref 135–146)

## 2015-12-04 NOTE — Patient Instructions (Addendum)
Medication Instructions:  Your physician recommends that you continue on your current medications as directed. Please refer to the Current Medication list given to you today.   Labwork: TODAY:  CBC & BMET 12/12/15:  CBC & BMET Testing/Procedures: None ordered  Follow-Up: Your physician recommends that you schedule a follow-up appointment in: 12/12/15 WITH A NURSE TO HAVE BP RECHECKED   Any Other Special Instructions Will Be Listed Below (If Applicable).     If you need a refill on your cardiac medications before your next appointment, please call your pharmacy.

## 2015-12-04 NOTE — Telephone Encounter (Signed)
Patient seen today by PA-C and Dr. Tenny Crawoss.

## 2015-12-05 ENCOUNTER — Telehealth: Payer: Self-pay | Admitting: *Deleted

## 2015-12-05 LAB — BRAIN NATRIURETIC PEPTIDE: BRAIN NATRIURETIC PEPTIDE: 375.3 pg/mL — AB (ref <100)

## 2015-12-05 MED ORDER — POTASSIUM CHLORIDE ER 10 MEQ PO TBCR: 10.0000 meq | EXTENDED_RELEASE_TABLET | Freq: Every day | ORAL | Status: DC

## 2015-12-05 MED ORDER — APIXABAN 2.5 MG PO TABS
2.5000 mg | ORAL_TABLET | Freq: Two times a day (BID) | ORAL | Status: DC
Start: 2015-12-05 — End: 2015-12-24

## 2015-12-05 NOTE — Telephone Encounter (Signed)
Placed order for eliquis

## 2015-12-05 NOTE — Telephone Encounter (Signed)
I would recomm 10 mEq KCL daily    Also would recomm stopping asprirn Start apixiban 2.5 mg 2x per day    Above notes from Dr. Tenny Crawoss. Spoke with patient and to her daughter.  Informed to start Eliquis 2.5 mg twice daily.  Pt has samples of medication as well as 30 day free trial card. Advised that she will need a current prescription to use the trial card.  Daughter stated they have enough samples until patient's appointment with new heart doctor on June 28 in [place], and will get a prescription from that doctor if he wants patient to continue taking Eliquis.  Also advised to start potassium chloride 10 mEq daily and stop her aspirin. Patient's daughter verbalizes understanding and agreement.

## 2015-12-07 ENCOUNTER — Other Ambulatory Visit: Payer: Medicare HMO

## 2015-12-12 ENCOUNTER — Ambulatory Visit (INDEPENDENT_AMBULATORY_CARE_PROVIDER_SITE_OTHER): Payer: Medicare HMO | Admitting: Pharmacist

## 2015-12-12 ENCOUNTER — Other Ambulatory Visit (INDEPENDENT_AMBULATORY_CARE_PROVIDER_SITE_OTHER): Payer: Medicare HMO | Admitting: *Deleted

## 2015-12-12 VITALS — BP 118/72 | HR 69 | Wt 179.0 lb

## 2015-12-12 DIAGNOSIS — I5041 Acute combined systolic (congestive) and diastolic (congestive) heart failure: Secondary | ICD-10-CM | POA: Diagnosis not present

## 2015-12-12 DIAGNOSIS — I5032 Chronic diastolic (congestive) heart failure: Secondary | ICD-10-CM | POA: Diagnosis not present

## 2015-12-12 LAB — CBC
HCT: 36.6 % (ref 35.0–45.0)
Hemoglobin: 11.4 g/dL — ABNORMAL LOW (ref 11.7–15.5)
MCH: 25.6 pg — ABNORMAL LOW (ref 27.0–33.0)
MCHC: 31.1 g/dL — ABNORMAL LOW (ref 32.0–36.0)
MCV: 82.2 fL (ref 80.0–100.0)
MPV: 10.1 fL (ref 7.5–12.5)
PLATELETS: 383 10*3/uL (ref 140–400)
RBC: 4.45 MIL/uL (ref 3.80–5.10)
RDW: 14.8 % (ref 11.0–15.0)
WBC: 6.9 10*3/uL (ref 3.8–10.8)

## 2015-12-12 LAB — BASIC METABOLIC PANEL
BUN: 20 mg/dL (ref 7–25)
CALCIUM: 8.8 mg/dL (ref 8.6–10.4)
CO2: 26 mmol/L (ref 20–31)
CREATININE: 0.96 mg/dL — AB (ref 0.60–0.88)
Chloride: 103 mmol/L (ref 98–110)
Glucose, Bld: 101 mg/dL — ABNORMAL HIGH (ref 65–99)
Potassium: 4.1 mmol/L (ref 3.5–5.3)
Sodium: 142 mmol/L (ref 135–146)

## 2015-12-12 NOTE — Progress Notes (Signed)
Patient ID: SUSANNA Hudson                 DOB: Nov 08, 1923                      MRN: [id number]     HPI: [name] is a 82 y.o. female patient of Dr. Tenny Craw who is seen today for BP check due to adjustments in her HF medications.  She has a PMH significant for HTN, syncope, HLD, OSA and systolic CHF.  She was seen by Carlean Jews, PA on 6/13.  Her BP at that visit was controlled at 124/62.  On 6/9 her CCB was stopped and pt placed on coreg and increased furosemide.  She diuresis ~4 lbs from 6/9-6/13 but still had edema on exam.  Her dry weight is ~167-170 lbs.  She is seen today for follow up weight and BP.    Pt states she continues to have swelling.  Her weight is down 2 lbs today but she still has 1-2+ pitting edema in her lower extremities and abdominal swelling.  She states she is having trouble sleeping and can walk only a short distance without getting short of breath.  She leaves to go back to [place] on Saturday to live at her other daughter's home.   Current HTN meds: carvedilol 6.25mg  BID, furosemide  BID, losartan  daily   Wt Readings from Last 3 Encounters:  12/04/15 181 lb (82.101 kg)  11/30/15 185 lb 6.4 oz (84.097 kg)  11/27/15 184 lb 8 oz (83.689 kg)   BP Readings from Last 3 Encounters:  12/04/15 124/62  11/30/15 140/82  11/27/15 158/84   Pulse Readings from Last 3 Encounters:  12/04/15 82  11/30/15 78  11/27/15 78    Renal function: Estimated Creatinine Clearance: 40.8 mL/min (by C-G formula based on Cr of 0.91).  Past Medical History  Diagnosis Date  . Hyperlipidemia   . GERD (gastroesophageal reflux disease)   . Sleep apnea   . Overactive bladder   . Kidney stones   . Hypertension   . Allergy   . Chronic dermatitis     face, allergic to many products  . AK (actinic keratosis)   . Syncope     Current Outpatient Prescriptions on File Prior to Visit  Medication Sig Dispense Refill  . acetaminophen (TYLENOL) 500 MG tablet Take 1,000 mg by mouth  at bedtime. OTC as directed    . apixaban (ELIQUIS) 2.5 MG TABS tablet Take 1 tablet (2.5 mg total) by mouth 2 (two) times daily. 60 tablet   . carvedilol (COREG) 6.25 MG tablet Take 1 tablet (6.25 mg total) by mouth 2 (two) times daily with a meal. 180 tablet 3  . cholecalciferol (VITAMIN D) 1000 UNITS tablet Take 1,000 Units by mouth daily.      . ferrous sulfate 325 (65 FE) MG tablet Take 325 mg by mouth daily with breakfast.    . fluocinonide-emollient (LIDEX-E) 0.05 % cream Apply 1 application topically 2 (two) times daily. On affected areas. 30 g 0  . furosemide (LASIX) 40 MG tablet Take 1 tablet (40 mg total) by mouth 2 (two) times daily. 60 tablet 1  . HYDROcodone-acetaminophen (NORCO/VICODIN) 5-325 MG per tablet TAKE 1/2 TO 1 TABLET BY MOUTH EVERY 6 HOURS AS NEEDED AS DIRECTED. FOR MODERATE PAIN 20 tablet 0  . losartan (COZAAR) 50 MG tablet Take 0.5 tablets (25 mg total) by mouth daily.    . potassium chloride (  K-DUR) 10 MEQ tablet Take 1 tablet (10 mEq total) by mouth daily. 90 tablet 3   No current facility-administered medications on file prior to visit.    Allergies  Allergen Reactions  . Codeine     REACTION: dizzy- but can tolerate hydrocodone.   . Levofloxacin Other (See Comments)    REACTION: itching  . Neomycin-Bacitracin Zn-Polymyx Other (See Comments)    unknown  . Neospect [Dyphylline-Ephed-Pb-Gg Or] Other (See Comments)    unknown  . Spironolactone     Hyperkalemia   . Tramadol     Didn't help with pain     Assessment/Plan:  1.  CHF- Pt still a few pounds over her dry weight and having SOB.  BMET pending.  Her BP is controlled but on the lower side.  Given she is scheduled to travel out of town for several months this weekend, will forward note to Dr. Tenny Crawoss for advice.    Pt records have been sent to [facility] (phone: [phone]) to establish care in [place].  She does not have an appointment scheduled yet.

## 2015-12-13 ENCOUNTER — Telehealth: Payer: Self-pay | Admitting: Pharmacist

## 2015-12-13 NOTE — Telephone Encounter (Signed)
Returned call to pt's daughter. [facility]. Daughter calling that [facility] [place] has not received cardiology referral yet. Advised pt's daughter that our office has made multiple efforts to contact [facility]. Our staff called yesterday to make the referral and were put on hold for > 10 minutes; no one ever returned to the line. They were called back twice and sent straight to voicemail so a message with the referral was left. This morning, a fax with the same referral was sent. Updated pt's daughter with this information and advised her to call [facility] to see where the gap in communication is since we have tried multiple times through multiple communication routes to refer pt to [facility]. She will call back tomorrow if she is still having trouble.

## 2015-12-13 NOTE — Telephone Encounter (Signed)
New message  Pt dtr requestd to speak w/ Megan following up from yesterdays 6/21 appt. Please call back and discuss.

## 2015-12-14 ENCOUNTER — Ambulatory Visit: Payer: Medicare HMO | Admitting: Internal Medicine

## 2015-12-17 ENCOUNTER — Telehealth: Payer: Self-pay

## 2015-12-17 ENCOUNTER — Telehealth: Payer: Self-pay | Admitting: Internal Medicine

## 2015-12-17 NOTE — Telephone Encounter (Signed)
Received call from [facility] in [place] Gave them verbal order for referral to see pt in CHF clinic  I told them I would have pts recent records (my clinic note from early June with PA and 2 since plus echo and labs)  Sent  Fax number is : [phone]

## 2015-12-17 NOTE — Telephone Encounter (Signed)
This message has been faxed to [facility] @ [phone] (Fax number provided by the pts daughter).  I did receive a confirmation that the fax was successful.

## 2015-12-17 NOTE — Telephone Encounter (Signed)
**Note De-Identified  Obfuscation** This patient is being referred By Dr Dietrich PatesPaula Ross, MD to [facility] (phone: [phone]) to establish care in [place].  The patients records have been sent to [facility].

## 2015-12-19 ENCOUNTER — Telehealth: Payer: Self-pay | Admitting: Internal Medicine

## 2015-12-19 NOTE — Telephone Encounter (Signed)
11/30/15 & 12/03/15 OV Notes,Labs,Echo,Ekg faxed to [facility] [place] Faxed to [phone] per Dr.Ross Verbal Referral Order.

## 2015-12-19 NOTE — Telephone Encounter (Signed)
Spoke with Selena BattenKim in medical records.  She faxed information previously to the same fax number.  See previous phone note.  Selena BattenKim will fax all information requested by Dr. Tenny Crawoss.

## 2015-12-24 ENCOUNTER — Other Ambulatory Visit: Payer: Self-pay | Admitting: *Deleted

## 2015-12-24 MED ORDER — APIXABAN 2.5 MG PO TABS: 2.5000 mg | ORAL_TABLET | Freq: Two times a day (BID) | ORAL | Status: DC

## 2016-01-21 ENCOUNTER — Other Ambulatory Visit: Payer: Self-pay | Admitting: Internal Medicine

## 2016-01-22 NOTE — Telephone Encounter (Signed)
Patient's was moving to [place] with one of her daughters.   She had a visit scheduled with a new cardiologist there.  Medication should be refilled by new provider.

## 2016-01-25 LAB — POCT B-TYPE NATRIURETIC PEPTIDE (BNP): Pro B Natriuretic peptide (BNP): 541

## 2016-10-30 ENCOUNTER — Ambulatory Visit: Payer: Medicare HMO | Admitting: Family Medicine

## 2016-11-03 ENCOUNTER — Encounter: Payer: Self-pay | Admitting: Family Medicine

## 2016-11-03 ENCOUNTER — Ambulatory Visit (INDEPENDENT_AMBULATORY_CARE_PROVIDER_SITE_OTHER): Payer: Medicare HMO | Admitting: Family Medicine

## 2016-11-03 DIAGNOSIS — L0390 Cellulitis, unspecified: Secondary | ICD-10-CM | POA: Diagnosis not present

## 2016-11-03 MED ORDER — CEPHALEXIN 500 MG PO CAPS: 500.0000 mg | ORAL_CAPSULE | Freq: Three times a day (TID) | ORAL | 0 refills | Status: DC

## 2016-11-03 NOTE — Progress Notes (Signed)
Skin sores.  Legs had been breaking out for weeks.  It was worse two weeks ago.  She had some discharge and they prev looked infected, bleeding on the sheets at night.  On the B lower legs and the B arms.  She has had edema chronically, sometimes worse than others, but has generally been controlled recently.    Occ lightheaded.  She has been fatigued recently but she has had some busy days recently.  D/w pt.    Meds, vitals, and allergies reviewed.   ROS: Per HPI unless specifically indicated in ROS section   nad ncat Neck supple, no LA rrr ctab abd soft Ext w/o edema Mult lesions noted on the skin, all appear superficial, in various stages of healing from scabbed lesions to flat postinflammatory macules.  None fluctuant.  On the B arms and legs.

## 2016-11-03 NOTE — Patient Instructions (Addendum)
Start keflex in the meantime and use dial soap about 1-2 times a week.  Keep your feet propped up.  Take care.  Glad to see you.  Update me as needed.

## 2016-11-03 NOTE — Assessment & Plan Note (Signed)
She could have combination of drying/cracking/thinning skin, tension from edema prev, and inc bacterial load/colonoization on the skin contributing.  D/w pt.  Would start keflex.  Use dial soap 1-2 times a week.  Continue moisturizer at baseline.  Update us as needed.  She agrees.

## 2016-11-27 ENCOUNTER — Telehealth: Payer: Self-pay

## 2016-11-27 MED ORDER — CEPHALEXIN 500 MG PO CAPS: 500.0000 mg | ORAL_CAPSULE | Freq: Three times a day (TID) | ORAL | 0 refills | Status: DC

## 2016-11-27 NOTE — Telephone Encounter (Signed)
June pts daughter (DPR signed) pt seen 11/03/16; Keflex has healed most of wounds on leg; pt has finished Keflex and pt still has one area on leg that is red and could be infected. June wants to know if should continue Keflex a while longer. June request [place]. [place]

## 2016-11-27 NOTE — Telephone Encounter (Signed)
Daughter advised.

## 2016-11-27 NOTE — Telephone Encounter (Signed)
Sent.  If not better soon, then I would like to recheck her.  Thanks.

## 2016-12-16 ENCOUNTER — Telehealth: Payer: Self-pay | Admitting: Family Medicine

## 2016-12-16 NOTE — Telephone Encounter (Signed)
Pt's daughter dropped off form to be completed for [facility]. Form left in MD box on RX tower for pick up. Form can be faxed back to them upon completion. Please advise.

## 2016-12-16 NOTE — Telephone Encounter (Signed)
Placed in Dr. Duncan's In Box. 

## 2016-12-17 NOTE — Telephone Encounter (Signed)
I'll work on the hard copy.  Thanks.  

## 2016-12-19 NOTE — Telephone Encounter (Signed)
Form completed and patient's daughter advised that it is ready for pickup.

## 2017-03-02 ENCOUNTER — Ambulatory Visit (INDEPENDENT_AMBULATORY_CARE_PROVIDER_SITE_OTHER): Payer: Medicare HMO | Admitting: Family Medicine

## 2017-03-02 ENCOUNTER — Encounter: Payer: Self-pay | Admitting: Family Medicine

## 2017-03-02 VITALS — BP 106/62 | HR 62 | Temp 97.5°F | Wt 171.5 lb

## 2017-03-02 DIAGNOSIS — Z23 Encounter for immunization: Secondary | ICD-10-CM

## 2017-03-02 DIAGNOSIS — L989 Disorder of the skin and subcutaneous tissue, unspecified: Secondary | ICD-10-CM

## 2017-03-02 NOTE — Progress Notes (Signed)
[facility] in [facility].  She is happy with her current living situation.   Skin lesions. No open sores. No bleeding. No ulceration. She has multiple lesions on her back and legs that she wanted checked. See exam below.  Meds, vitals, and allergies reviewed.   ROS: Per HPI unless specifically indicated in ROS section   nad ncat rrr ctab Normal BS Back with multiple benign-appearing seborrheic keratoses noted. Also with a seborrheic keratosis noted on the left hand. She has some similar lesions on the legs but she has more postinflammatory hyperpigmented macules on the bilateral shins where she previously had a skin infection that had resolved.  Flu shot done at [facility].

## 2017-03-02 NOTE — Patient Instructions (Addendum)
Ask your pharmacy to make sure your valsartan didn't have a recall.   2 types of skin spots.   Seborrheic keratosis- the rough "stuck on" spots.  Postinflammatory hyperpigmentation- the residual skin darkening from the prior skin infection.  All appear benign.   If you have bleeding or ulcers, then let me know.  Take care.  Glad to see you.

## 2017-03-03 NOTE — Assessment & Plan Note (Signed)
seborrheic keratosis and postinflammatory hyperpigmentation. Reassured about all. Update me as needed. She agrees.

## 2017-05-01 ENCOUNTER — Encounter: Payer: Self-pay | Admitting: Family Medicine

## 2017-05-01 ENCOUNTER — Ambulatory Visit: Payer: Medicare HMO | Admitting: Family Medicine

## 2017-05-01 VITALS — BP 102/52 | HR 69 | Temp 97.6°F | Wt 166.5 lb

## 2017-05-01 DIAGNOSIS — L989 Disorder of the skin and subcutaneous tissue, unspecified: Secondary | ICD-10-CM

## 2017-05-01 DIAGNOSIS — M25562 Pain in left knee: Secondary | ICD-10-CM

## 2017-05-01 MED ORDER — DICLOFENAC SODIUM 1 % TD GEL: 2.0000 g | Freq: Four times a day (QID) | TRANSDERMAL | 1 refills | Status: DC

## 2017-05-01 MED ORDER — CEPHALEXIN 500 MG PO CAPS: 500.0000 mg | ORAL_CAPSULE | Freq: Three times a day (TID) | ORAL | 0 refills | Status: DC

## 2017-05-01 NOTE — Progress Notes (Signed)
[place] has [facility] [place].  It has been a good transition for the patient, she is adjusting to the change and happy about her situation.    She isn't lightheaded, d/w pt.   L knee swelling.  Worse pain if wearing flat shoes, some better with wearing a wedge heel.  No R knee sx.  Some better today.  No changes with the weather.  Only recently with sx.  No trauma, no falls.  Using walker at baseline.   Some sensation of grinding, sensation of mild sticking with ROM.  Joint doesn't get stuck.  Taking tylenol with some relief, BID.    Skin lesions on the BLE.  Had healed over with most of them but then a few lesions erupted recently.  Some post inflammatory hyperpigmentation.    Meds, vitals, and allergies reviewed.   ROS: Per HPI unless specifically indicated in ROS section   nad ncat rrr ctab L knee with limited extension and pain with flexion much past 90 deg  ACL MCL LCL still feel solid.   No pain at patella Lateral joint line ttp but medial isn't ttp  No bruising Trace BLE with a few healing superficial sores noted on the B shins. No abscess.  Old healing lesions noted with some postinflammatory hyperpigmentation noted.

## 2017-05-01 NOTE — Patient Instructions (Signed)
Concern for combination of joint irritation/arthritis and possible meniscal problems.   We'll work on getting PT set up.  Okay to use ice 5 minutes on, 5 minutes off.  Tylenol twice a day as you have been doing.  Use voltaren gel for pain.  Restart the antibiotics.  Take care.  Glad to see you.

## 2017-05-03 NOTE — Assessment & Plan Note (Signed)
Concern for combination of joint irritation/arthritis and possible meniscal problems.   We'll work on getting PT set up.  Okay to use ice 5 minutes on, 5 minutes off.  Tylenol twice a day as you have been doing.  Use voltaren gel for pain.  D/w pt.  She agrees.

## 2017-05-03 NOTE — Assessment & Plan Note (Signed)
She has some old seborrheic keratoses and some postinflammatory hyperpigmentation noted but she also has some more acutely irritated areas.  Discussed with patient.  Had improved with Keflex previously.  Restart Keflex.  Continue to try to elevate her legs to help with swelling.  Update me as needed.  No fluctuant mass.  No need for incision and drainage.

## 2017-06-17 ENCOUNTER — Telehealth: Payer: Self-pay | Admitting: Internal Medicine

## 2017-06-17 NOTE — Telephone Encounter (Signed)
Spoke with June (on [place]) RE: concerns r/t pt.  June reports patient has not been feeling well since Christmas Eve.  June reports that has been such a change in her during her visit to her home over the Christmas holiday.  Pt is having bouts of "usual weakness."  Daughter can not give me any further information r/t pt's s/s.  PT lives at [place] and daughter does not have a list of medications and doesn't know them.  She is requesting a sooner appt with Dr Tenny Crawoss.  Advised Dr [facility] and I will forward this information to her and her nurse for review to see when her appt can be moved up if possible.   Pt does have an appt scheduled with PA 06/24/17 for evaluation.  June states she will just keep the appt as scheduled for now but to please keep them on the call list in case someone cancels.  She thanked me for my c/b and time.

## 2017-06-17 NOTE — Telephone Encounter (Signed)
New Message    Patient daughter calling , patient is very weak and wants her to be seen sooner

## 2017-06-18 ENCOUNTER — Encounter: Payer: Self-pay | Admitting: Family Medicine

## 2017-06-18 ENCOUNTER — Ambulatory Visit: Payer: Medicare HMO | Admitting: Family Medicine

## 2017-06-18 ENCOUNTER — Ambulatory Visit: Payer: Medicare HMO | Admitting: Internal Medicine

## 2017-06-18 VITALS — BP 92/50 | HR 83 | Temp 97.8°F | Wt 165.5 lb

## 2017-06-18 DIAGNOSIS — R319 Hematuria, unspecified: Secondary | ICD-10-CM | POA: Diagnosis not present

## 2017-06-18 DIAGNOSIS — R300 Dysuria: Secondary | ICD-10-CM

## 2017-06-18 DIAGNOSIS — N390 Urinary tract infection, site not specified: Secondary | ICD-10-CM

## 2017-06-18 LAB — POC URINALSYSI DIPSTICK (AUTOMATED)
Bilirubin, UA: NEGATIVE
Glucose, UA: NEGATIVE
KETONES UA: NEGATIVE
Leukocytes, UA: NEGATIVE
Nitrite, UA: NEGATIVE
SPEC GRAV UA: 1.015 (ref 1.010–1.025)
Urobilinogen, UA: 0.2 E.U./dL
pH, UA: 6 (ref 5.0–8.0)

## 2017-06-18 MED ORDER — SULFAMETHOXAZOLE-TRIMETHOPRIM 400-80 MG PO TABS: 1.0000 | ORAL_TABLET | Freq: Two times a day (BID) | ORAL | 0 refills | Status: DC

## 2017-06-18 NOTE — Telephone Encounter (Signed)
Agree with appt on 1/2   If feeling really bad, go to ED    I am not in office   Note appt on 1/2 with B Bhagat

## 2017-06-18 NOTE — Progress Notes (Signed)
BP is lower than typical.  Slightly lightheaded.  Fatigued and weaker than usual but she feels better today than yesterday.    She had fallen on 06/16/17, was down and family helped her up.   She had some confusion.  Then had some L sided flank and lower back pain.  She had similar sx with prev UTI.  Prev treated for UTI 05/17/17 and felt better for a few weeks.    Meds, vitals, and allergies reviewed.   ROS: Per HPI unless specifically indicated in ROS section   nad ncat Mmm Neck supple, no LA rrr Ctab, no focal dec in BS abd soft, not ttp L side of back slightly ttp but no frank cva pain.   No midline back pain.  BLE with trace, almost no, edema.

## 2017-06-18 NOTE — Progress Notes (Deleted)
Cardiology Office Note    Date:  06/18/2017   ID:  [name], [name] [birth year], MRN [id number]  PCP:  Joann Nam, MD  Cardiologist:  Dr.Ross  Chief Complaint: 18 Months follow up  History of Present Illness:   [name] is a 83 y.o. female with a history of HTN, syncope, HLD, OSA and chronic systolic CHF presents for follow up.   Prev with neg cath at [facility]. Followed by Dr. Eden Emms for presyncope/syncope while eating (last seen 04/10/2014) --> her episodes sounded like faint. Not related to meds and no documented bradycardia. No obvious etiology identified. No episode since then. Echo 12/2014showed normal ef (55-60%), no valve disease at [facility] in [place].  She lives in [place] and [place] part time.   Seen by Chelsea Aus PA-C'Hudson  11/2015 for evaluation of new onset CHF. ECHO with new low EF ~30-35%  new wall motion abnormalities with akinesis and aneurysmal dilitation of the apex Basal portions of LV normal No evid of thrombus. Overall consistent with silent MI with resultant CHF. Dr. Tenny Craw also saw patient. She had a long discussion with patient and family and decided to proceed with conservative treatment given her age. No ischemic eval will be persued. Her CCB was stopped and she was placed on Coreg. Lasix increased to 40mg  BID and ASA 81 mg daily was added. Of note, "pre thrombotic sludge" noted at LV Apex on echo as well.   She was doing well when seen for follow up by Darral Dash and Dr. Tenny Craw 12/03/2015. Started on Eliquis 2.5mg  BID. Stopped ASA.  Plan to establish care in CHF clinic, [place].   The patient has establish care with Joann Kingdom, MD([place], [place]). Reviewed notes in Epic. Last seen 09/29/16. Meds are listed as below: Medication Sig Dispense Refill  . apixaban (ELIQUIS) 2.5 mg tablet Take 1 tablet (2.5 mg total) by mouth 2 (two) times a day 60 tablet 3  . aspirin 81 MG EC tablet Take 1 tablet (81 mg total) by mouth daily 1  tablet 1  . carvedilol (COREG) 6.25 MG tablet Take 1 tablet (6.25 mg total) by mouth 2 (two) times a day with meals 60 tablet 11  . cholecalciferol (VITAMIN D3) 1,000 unit tablet Take 1,000 Units by mouth daily  . pravastatin (PRAVACHOL) 40 MG tablet Take 1 tablet (40 mg total) by mouth nightly 30 tablet 3  . sacubitril-valsartan (ENTRESTO) 49-51 mg per tablet Take 1 tablet by mouth 2 (two) times a day 60 tablet 11  . spironolactone (ALDACTONE) 25 MG tablet Take 0.5 tablets (12.5 mg total) by mouth daily 16 tablet 2  . torsemide (DEMADEX) 20 MG tablet Take 40 mg in the morning and 20 mg in the afternoon 270 tablet 0   Here today for follow up.    Past Medical History:  Diagnosis Date  . AK (actinic keratosis)   . Allergy   . Chronic dermatitis    face, allergic to many products  . GERD (gastroesophageal reflux disease)   . Hyperlipidemia   . Hypertension   . Kidney stones   . Overactive bladder   . Sleep apnea   . Syncope     Past Surgical History:  Procedure Laterality Date  . broken nose    . COLONOSCOPY  2004    Current Medications: Prior to Admission medications   Medication Sig Start Date End Date Taking? Authorizing Provider  acetaminophen (TYLENOL) 500 MG tablet Take 1,000 mg by mouth  at bedtime. OTC as directed    [provider]  aspirin EC 81 MG tablet Take 81 mg by mouth daily.    [provider]  carvedilol (COREG) 6.25 MG tablet Take 1 tablet (6.25 mg total) by mouth 2 (two) times daily with a meal. 11/30/15   Raymar Joiner, PA  cephALEXin (KEFLEX) 500 MG capsule Take 1 capsule (500 mg total) 3 (three) times daily by mouth. 05/01/17   Joann Namuncan, Joann S, MD  cholecalciferol (VITAMIN D) 1000 UNITS tablet Take 1,000 Units by mouth daily.      [provider]  diclofenac sodium (VOLTAREN) 1 % GEL Apply 2 g 4 (four) times daily topically. On the left knee for pain 05/01/17   Joann Namuncan, Joann S, MD  ELIQUIS 2.5 MG TABS tablet TAKE 1 TABLET  (2.5 MG TOTAL) BY MOUTH 2 (TWO) TIMES DAILY. 01/22/16   Pricilla Riffleoss, Paula V, MD  pravastatin (PRAVACHOL) 40 MG tablet Take 40 mg by mouth daily.    [provider]  sacubitril-valsartan (ENTRESTO) 49-51 MG Take 1 tablet by mouth 2 (two) times daily.    [provider]  spironolactone (ALDACTONE) 25 MG tablet Take 12.5 mg by mouth daily.    [provider]  torsemide (DEMADEX) 20 MG tablet Take 2 tablets in the AM and 1 tablet in the afternoon.    [provider]    Allergies:   Codeine; Levofloxacin; Neomycin-bacitracin zn-polymyx; Neospect [dyphylline-ephed-pb-gg or]; Spironolactone; and Tramadol   Social History   Socioeconomic History  . Marital status: Widowed    Spouse name: Not on file  . Number of children: Not on file  . Years of education: Not on file  . Highest education level: Not on file  Social Needs  . Financial resource strain: Not on file  . Food insecurity - worry: Not on file  . Food insecurity - inability: Not on file  . Transportation needs - medical: Not on file  . Transportation needs - non-medical: Not on file  Occupational History  . Not on file  Tobacco Use  . Smoking status: Former Smoker    Types: Cigarettes  . Smokeless tobacco: Never Used  . Tobacco comment: smoked during college  Substance and Sexual Activity  . Alcohol use: No  . Drug use: No  . Sexual activity: Not on file  Other Topics Concern  . Not on file  Social History Narrative   Widowed 2014 -husband had coronary artery disease and valvular disease   Has grand children and great grandchildren     Family History:  The patient'Hudson family history includes Cancer in her mother; Coronary artery disease in her father. ***  ROS:   Please see the history of present illness.    ROS All other systems reviewed and are negative.   PHYSICAL EXAM:   VS:  There were no vitals taken for this visit.   GEN: Well nourished, well developed, in no acute distress  HEENT:  normal  Neck: no JVD, carotid bruits, or masses Cardiac: ***RRR; no murmurs, rubs, or gallops,no edema  Respiratory:  clear to auscultation bilaterally, normal work of breathing GI: soft, nontender, nondistended, + BS MS: no deformity or atrophy  Skin: warm and dry, no rash Neuro:  Alert and Oriented x 3, Strength and sensation are intact Psych: euthymic mood, full affect  Wt Readings from Last 3 Encounters:  05/01/17 166 lb 8 oz (75.5 kg)  03/02/17 171 lb 8 oz (77.8 kg)  11/03/16 176 lb 8  oz (80.1 kg)      Studies/Labs Reviewed:   EKG:  EKG is ordered today.  The ekg ordered today demonstrates ***  Recent Labs: No results found for requested labs within last 8760 hours.   Lipid Panel    Component Value Date/Time   CHOL 183 05/09/2011 0955   TRIG 103.0 05/09/2011 0955   HDL 60.90 05/09/2011 0955   CHOLHDL 3 05/09/2011 0955   VLDL 20.6 05/09/2011 0955   LDLCALC 102 (H) 05/09/2011 0955    Additional studies/ records that were reviewed today include:   Echocardiogram: 11/2015 Study Conclusions  - Left ventricle: The cavity size was normal. Systolic function was   moderately to severely reduced. The estimated ejection fraction   was in the range of 30% to 35%. Doppler parameters are consistent   with abnormal left ventricular relaxation (grade 1 diastolic   dysfunction). - Aortic valve: Trileaflet; normal thickness leaflets. There was no   regurgitation. - Aortic root: The aortic root was normal in size. - Ascending aorta: The ascending aorta was normal in size. - Mitral valve: Calcified annulus. Mildly thickened leaflets . - Left atrium: The atrium was normal in size. - Right ventricle: The cavity size was mildly dilated. Wall   thickness was normal. Systolic function was normal. - Tricuspid valve: There was mild regurgitation. - Pulmonic valve: There was trivial regurgitation. - Pulmonary arteries: Systolic pressure was mildly increased. PA   peak pressure: 34 mm  Hg (Hudson). - Inferior vena cava: The vessel was normal in size. The   respirophasic diameter changes were blunted (< 50%), consistent   with elevated central venous pressure. - Pericardium, extracardiac: There was no pericardial effusion.  Impressions:  - When compared to the prior study from 2010 there is now   significant change.   There is akinesis and aneurysmal dilatation of all of the apical   segments and mid anteroseptal, anterior and anterolateral walls.   There is no obvious thrombus but heavy blood layering (sludge) in   the LV apex consistent with a pre-thrombotic state.      ASSESSMENT & PLAN:    1. Chronic systolic CHF with presume ICM  2. HTN  3. HLD  4. LV sludge without clearly defined thrombus - On Eliquis 2.5mg  BID      Medication Adjustments/Labs and Tests Ordered: Current medicines are reviewed at length with the patient today.  Concerns regarding medicines are outlined above.  Medication changes, Labs and Tests ordered today are listed in the Patient Instructions below. There are no Patient Instructions on file for this visit.   Lorelei PontSigned, Deyci Gesell, [place]  06/18/2017 3:45 PM    [facility] [address], [place], [place]  [address] Phone: [phone]; Fax: [phone]

## 2017-06-18 NOTE — Patient Instructions (Addendum)
Drink plenty of water and start the antibiotics today.  We'll contact you with your lab report.  Take care.  Glad to see you.  If worse, then go to the ER.   Please allow transfer to rehab room for short term care.  Contact clinic if needed.  [phone].

## 2017-06-19 ENCOUNTER — Ambulatory Visit: Payer: Medicare HMO | Admitting: Family Medicine

## 2017-06-19 DIAGNOSIS — R319 Hematuria, unspecified: Secondary | ICD-10-CM

## 2017-06-19 DIAGNOSIS — N390 Urinary tract infection, site not specified: Secondary | ICD-10-CM | POA: Insufficient documentation

## 2017-06-19 LAB — URINE CULTURE
MICRO NUMBER:: 81452381
RESULT: NO GROWTH
SPECIMEN QUALITY:: ADEQUATE

## 2017-06-19 NOTE — Telephone Encounter (Signed)
Informed June of recommendations per Dr. Tenny Crawoss.  June states pt's PCP got her in yesterday and she does have a UTI.  Pt currently on ABT.  June will keep appt with Chelsea AusVin Bhagat, PA-C on 1/2.  June appreciative for call.

## 2017-06-19 NOTE — Assessment & Plan Note (Signed)
Given her hx, likely.  At this point, still okay for outpatient f/u.  Start septra given levaquin reaction in the past.  Continue PO fluids.  She can get rehab room assignment temporarily for extra help in the next few days.  All agreed, update me as needed.  Check ucx.

## 2017-06-22 ENCOUNTER — Telehealth: Payer: Self-pay | Admitting: Family Medicine

## 2017-06-22 NOTE — Telephone Encounter (Signed)
Copied from CRM [id number]. Topic: Medical Record Request - Provider/Facility Request >> Jun 22, 2017  3:12 PM Landry MellowFoltz, Melissa J wrote: Patient Name/DOB/MRN #: [name] vali - 1923-12-25 - [id number] Requestor Name/Agency: [facility]  Call Back #: [phone] Information Requested: [facility] - they need to have documentation of last visit - uti and labs -  fax number is [phone]   Route to [facility] HIM Pool for [place] clinics. For all other clinics, route to the clinic's [facility] Pool.

## 2017-06-24 ENCOUNTER — Ambulatory Visit (INDEPENDENT_AMBULATORY_CARE_PROVIDER_SITE_OTHER): Payer: Medicare Other | Admitting: Internal Medicine

## 2017-06-24 ENCOUNTER — Other Ambulatory Visit: Payer: Self-pay

## 2017-06-24 ENCOUNTER — Ambulatory Visit (INDEPENDENT_AMBULATORY_CARE_PROVIDER_SITE_OTHER)
Admission: RE | Admit: 2017-06-24 | Discharge: 2017-06-24 | Disposition: A | Discharge status: Home / Self Care | Payer: Medicare Other | Source: Ambulatory Visit | Attending: Internal Medicine | Admitting: Internal Medicine

## 2017-06-24 ENCOUNTER — Ambulatory Visit: Payer: Medicare HMO | Admitting: Physician Assistant

## 2017-06-24 ENCOUNTER — Encounter: Payer: Self-pay | Admitting: Internal Medicine

## 2017-06-24 VITALS — BP 112/54 | HR 71 | Ht 62.0 in | Wt 165.2 lb

## 2017-06-24 DIAGNOSIS — I952 Hypotension due to drugs: Secondary | ICD-10-CM

## 2017-06-24 DIAGNOSIS — N390 Urinary tract infection, site not specified: Secondary | ICD-10-CM

## 2017-06-24 DIAGNOSIS — I255 Ischemic cardiomyopathy: Secondary | ICD-10-CM

## 2017-06-24 DIAGNOSIS — R55 Syncope and collapse: Secondary | ICD-10-CM

## 2017-06-24 DIAGNOSIS — I5022 Chronic systolic (congestive) heart failure: Secondary | ICD-10-CM | POA: Diagnosis not present

## 2017-06-24 MED ORDER — SACUBITRIL-VALSARTAN 24-26 MG PO TABS: 1.0000 | ORAL_TABLET | Freq: Two times a day (BID) | ORAL | 11 refills | Status: DC

## 2017-06-24 MED ORDER — ASPIRIN EC 81 MG PO TBEC: 81.0000 mg | DELAYED_RELEASE_TABLET | Freq: Every day | ORAL | 3 refills | Status: DC

## 2017-06-24 MED ORDER — SACUBITRIL-VALSARTAN 24-26 MG PO TABS: 1.0000 | ORAL_TABLET | Freq: Two times a day (BID) | ORAL | 0 refills | Status: DC

## 2017-06-24 NOTE — Patient Instructions (Addendum)
Medication Instructions: Your physician has recommended you make the following change in your medication:  -1) DECREASE Entresto 24/26 - Take 1 tablet by mouth twice daily -2) STOP Eliquis Labwork: None Ordered  Procedures/Testing: Your physician has recommended that you have a Loop Recorder [facility](LINQ). A Loop recorder is a small device that is placed under the skin of your chest or abdomen to monitor abnormal heart rhythms and syncope episodes. This device records electrical pulses and heart rates.   Follow-Up: Your physician recommends that you schedule a follow-up appointment in: 10-14 with the device Clinic for a wound check   If you need a refill on your cardiac medications before your next appointment, please call your pharmacy.   - Please [facility] - [facility] Stay at 6:30 am on Friday 06/26/17 - Proceed to admitting - this will be found on your left to get registered.  - You may take all your regular morning medication with enough water to get them down safely - Please have someone to drive you home

## 2017-06-24 NOTE — Telephone Encounter (Signed)
[facility] called pt is at appt and notes requested are not there. Robin at front desk said for office to fax request on their letterhead to [phone]. [facility] voiced understanding.

## 2017-06-24 NOTE — Progress Notes (Signed)
ELECTROPHYSIOLOGY CONSULT NOTE  Patient ID: [name], MRN: [id number], DOB/AGE: 01-14-1924 84 y.o. Admit date: (Not on file) Date of Consult: 06/24/2017  Primary Physician: Joaquim Nam, MD Primary Cardiologist: PR     [name] is a 84 y.o. female who is being seen today for the evaluation of syncope at the request of Dr. Illene Regulus   HPI [name] is a 84 y.o. female  With a history of recurrent abrupt onset and relatively brief syncope without significant residual.  His first episode occurred a couple of years ago with an interval hiatus of about 2 years prior to resumption this fall.  They have been witnessed by her daughters.  There is abrupt loss of consciousness.  This persists for 30-60 seconds.  Upon awakening she is immediately aware of circumstances and herself.  More recently she had an event where she was found on the floor having gone to the bathroom 3 hours previously.  No antecedent history of post micturition/defecation syncope.  She has a history of left ventricular dysfunction.  Ejection fraction 2017 was 35% with aneurysmal dilatation of the LV apex; this was a significant change from 2014 when her LV function was normal.  Notes from [place] were reviewed where she reported chronic fatigue intermittent edema abdominal distention.  Diuretics and had to be down titrated because of increasing creatinine.       The family was told during that hospitalization that her heart rate was in the 40s     Carotid Dopplers 2014 were minimally abnormal.  LDLs were elevated 2017 and she is on low-dose statin.      Past Medical History:  Diagnosis Date  . AK (actinic keratosis)   . Allergy   . Chronic dermatitis    face, allergic to many products  . GERD (gastroesophageal reflux disease)   . Hyperlipidemia   . Hypertension   . Kidney stones   . Overactive bladder   . Sleep apnea   . Syncope       Surgical History:  Past Surgical History:    Procedure Laterality Date  . broken nose    . COLONOSCOPY  2004     Home Meds: Prior to Admission medications   Medication Sig Start Date End Date Taking? Authorizing Provider  acetaminophen (TYLENOL) 500 MG tablet Take 1,000 mg by mouth at bedtime. OTC as directed   Yes [provider]  aspirin EC 81 MG tablet Take 81 mg by mouth daily.   Yes [provider]  carvedilol (COREG) 6.25 MG tablet Take 1 tablet (6.25 mg total) by mouth 2 (two) times daily with a meal. Patient taking differently: Take 3.125 mg by mouth 2 (two) times daily with a meal.  11/30/15  Yes Bhagat, Bhavinkumar, PA  cholecalciferol (VITAMIN D) 1000 UNITS tablet Take 500 Units by mouth daily.    Yes [provider]  diclofenac sodium (VOLTAREN) 1 % GEL Apply 2 g 4 (four) times daily topically. On the left knee for pain 05/01/17  Yes Joaquim Nam, MD  ELIQUIS 2.5 MG TABS tablet TAKE 1 TABLET (2.5 MG TOTAL) BY MOUTH 2 (TWO) TIMES DAILY. 01/22/16  Yes Pricilla Riffle, MD  pravastatin (PRAVACHOL) 40 MG tablet Take 40 mg by mouth daily.   Yes [provider]  spironolactone (ALDACTONE) 25 MG tablet Take 12.5 mg by mouth daily.   Yes [provider]  sulfamethoxazole-trimethoprim (BACTRIM) 400-80 MG tablet Take 1 tablet  by mouth 2 (two) times daily. 06/18/17  Yes Joaquim Namuncan, [name], MD  torsemide [facility](DEMADEX) 20 MG tablet Take 2 tablets in the AM by mouth four times weekly, Sunday,Monday , Tuesday and Wednesday   Yes [provider]    Allergies:  Allergies  Allergen Reactions  . Neosporin Plus Max St Rash  . Codeine Other (See Comments)    REACTION: dizzy- but can tolerate hydrocodone.   . Levofloxacin Other (See Comments)    REACTION: itching  . Neomycin-Bacitracin Zn-Polymyx Other (See Comments)    unknown  . Neospect [Dyphylline-Ephed-Pb-Gg Or] Other (See Comments)    unknown  . Spironolactone     Hyperkalemia   . Tramadol     Didn't help with pain    Social  History   Socioeconomic History  . Marital status: Widowed    Spouse name: Not on file  . Number of children: Not on file  . Years of education: Not on file  . Highest education level: Not on file  Social Needs  . Financial resource strain: Not on file  . Food insecurity - worry: Not on file  . Food insecurity - inability: Not on file  . Transportation needs - medical: Not on file  . Transportation needs - non-medical: Not on file  Occupational History  . Not on file  Tobacco Use  . Smoking status: Former Smoker    Types: Cigarettes  . Smokeless tobacco: Never Used  . Tobacco comment: smoked during college  Substance and Sexual Activity  . Alcohol use: No  . Drug use: No  . Sexual activity: Not on file  Other Topics Concern  . Not on file  Social History Narrative   Widowed 2014 -husband had coronary artery disease and valvular disease   Has grand children and great grandchildren     Family History  Problem Relation Age of Onset  . Cancer Mother        stomach  . Coronary artery disease Father   . Colon cancer Neg Hx      ROS:  Please see the history of present illness.     All other systems reviewed and negative.    Physical Exam: Blood pressure (!) 112/54, pulse 71, height 5\' 2"  (1.575 m), weight 165 lb 3.2 oz (74.9 kg), SpO2 93 %. General: Well developed, well nourished female in no acute distress. Head: Normocephalic, atraumatic, sclera non-icteric, no xanthomas, nares are without discharge. EENT: normal  Lymph Nodes:  none Neck: Negative for carotid bruits. JVD 7-8 Back:with kyphosis Lungs: Clear bilaterally to auscultation without wheezes, rales, or rhonchi. Breathing is unlabored. Heart: RRR with S1 S2.  2/6 systolic murmur . No rubs, or gallops appreciated. Abdomen: Soft, non-tender, non-distended with normoactive bowel sounds. No hepatomegaly. No rebound/guarding. No obvious abdominal masses. Msk:  Strength and tone appear normal for age. Extremities:  No clubbing or cyanosis.tr edema.  Distal pedal pulses are 2+ and equal bilaterally. Skin: Warm and Dry Neuro: Alert and oriented X 3. CN III-XII intact Grossly normal sensory and motor function . Psych:  Responds to questions appropriately with a normal affect.      Labs: Cardiac Enzymes No results for input(s): CKTOTAL, CKMB, TROPONINI in the last 72 hours. CBC Lab Results  Component Value Date   WBC 6.9 12/12/2015   HGB 11.4 (L) 12/12/2015   HCT 36.6 12/12/2015   MCV 82.2 12/12/2015   PLT 383 12/12/2015   PROTIME: No results for input(s): LABPROT, INR in the last 72  hours. Chemistry No results for input(s): NA, K, CL, CO2, BUN, CREATININE, CALCIUM, PROT, BILITOT, ALKPHOS, ALT, AST, GLUCOSE in the last 168 hours.  Invalid input(s): LABALBU Lipids Lab Results  Component Value Date   CHOL 183 05/09/2011   HDL 60.90 05/09/2011   LDLCALC 102 (H) 05/09/2011   TRIG 103.0 05/09/2011   BNP Pro B Natriuretic peptide (BNP)  Date/Time Value Ref Range Status  07/20/2008 10:00 AM <30.0 0.0 - 100.0 pg/mL Final  11/10/2007 09:01 AM 28.0 0.0 - 100.0 pg/mL Final   Thyroid Function Tests: No results for input(s): TSH, T4TOTAL, T3FREE, THYROIDAB in the last 72 hours.  Invalid input(s): FREET3 Miscellaneous No results found for: DDIMER  Radiology/Studies:  No results found.  EKG: Sinus rhythm at 71 Interval 16/08/40 Prior inferior wall MI Prior anterior wall MI  Inferior wall MI is old to 2014 anterior wall changes are notable to 2017   Assessment and Plan:  Syncope  Cardiomyopathy-presumed ischemic with anterior wall motion abnormality  Congestive heart failure-chronic-systolic  Hypotension    The patient has had recurrent syncope.  The fact that she is cognitively aware and suggests an arrhythmia.  She certainly is at risk for bradycardia arrhythmias based on the history although her QRS duration being normal makes this statistically quite unlikely.  She is at risk  for ventricular arrhythmias.  The episode on the commode certainly sounds like post micturition syncope but could be anything.  We have discussed the use of an event recorder.  They would like to proceed.  She also has hypotension.  This may be contributing to her fatigue as well as her syncope.  We will decrease her Entresto 49/51--24/26.  I would think of a target blood pressure in her about 130-40.  If her blood pressure persists lower than this I would decrease her carvedilol.  She might also benefit from the discontinuation of Entresto and the resumption of perhaps losartan   She is euvolemic today.  We will continue her diuretics as currently dosed.  We will discontinue her Eliquis.  The family is not clear of the indication.  There is no "obvious thrombus "we will continue her on aspirin.     Sherryl Manges

## 2017-06-25 ENCOUNTER — Ambulatory Visit: Payer: Medicare HMO | Admitting: Physician Assistant

## 2017-06-26 ENCOUNTER — Encounter (HOSPITAL_COMMUNITY): Admission: RE | Payer: Self-pay | Source: Ambulatory Visit

## 2017-06-26 ENCOUNTER — Telehealth: Payer: Self-pay | Admitting: *Deleted

## 2017-06-26 ENCOUNTER — Ambulatory Visit (HOSPITAL_COMMUNITY): Admission: RE | Admit: 2017-06-26 | Payer: Medicare Other | Source: Ambulatory Visit | Admitting: Internal Medicine

## 2017-06-26 SURGERY — LOOP RECORDER INSERTION

## 2017-06-26 NOTE — Telephone Encounter (Signed)
Late entry for 06/25/17 5pm: Received message from Dr. Tenny Crawoss of an issue with patient's supplemental insurance.  Pt is to have loop recorder insertion on 06/26/17 by Dr. Graciela HusbandsKlein.  Pt's daughter requested to cancel procedure due to this complication.  Clydie BraunKaren at cath lab was notified to cancel procedure.

## 2017-07-13 ENCOUNTER — Encounter: Payer: Self-pay | Admitting: *Deleted

## 2017-07-13 ENCOUNTER — Ambulatory Visit: Payer: Medicare HMO

## 2017-07-27 ENCOUNTER — Ambulatory Visit (INDEPENDENT_AMBULATORY_CARE_PROVIDER_SITE_OTHER): Payer: Medicare HMO | Admitting: Internal Medicine

## 2017-07-27 ENCOUNTER — Encounter: Payer: Self-pay | Admitting: Internal Medicine

## 2017-07-27 VITALS — BP 110/52 | HR 80 | Ht 62.0 in | Wt 158.4 lb

## 2017-07-27 DIAGNOSIS — I5022 Chronic systolic (congestive) heart failure: Secondary | ICD-10-CM

## 2017-07-27 DIAGNOSIS — R55 Syncope and collapse: Secondary | ICD-10-CM | POA: Diagnosis not present

## 2017-07-27 DIAGNOSIS — E782 Mixed hyperlipidemia: Secondary | ICD-10-CM

## 2017-07-27 DIAGNOSIS — I10 Essential (primary) hypertension: Secondary | ICD-10-CM

## 2017-07-27 DIAGNOSIS — I2510 Atherosclerotic heart disease of native coronary artery without angina pectoris: Secondary | ICD-10-CM

## 2017-07-27 DIAGNOSIS — R609 Edema, unspecified: Secondary | ICD-10-CM | POA: Diagnosis not present

## 2017-07-27 NOTE — Patient Instructions (Signed)
Your physician recommends that you continue on your current medications as directed. Please refer to the Current Medication list given to you today. Your physician recommends that you return for lab work TODAY (lipids, bmet, cbc, bnp)

## 2017-07-27 NOTE — Progress Notes (Signed)
Cardiology Office Note   Date:  07/27/2017   ID:  [name], DOB 19-Jun-1924, MRN [id number]  PCP:  Joaquim Nam, MD  Cardiologist:   Dietrich Pates, MD   F/U of chronic systolic CHF     History of Present Illness: [name] is a 84 y.o. female with a history of HTN, syncope, HL, OSA, systolic CHF Followed in past by Burna Forts  I saw her along with B Bhagat back in 2017  She was seen in clinic for CHF Had edema  Echo showed LVEF was 30 to 35%  Multiple wall motion abnormalities.  Plan for medical Rx   Meds switched  Echo with apical sludge   Placed on Eliquis at the time   After that vist the pt went back to [facility]  The pt has now moved to [place] She was seen by Odessa Fleming back on 1/2  While in [place] was hospitalized  ? Syncopal spell    When seen by Odessa Fleming taken off of Eliquis  Set up for LINQ  This was not done due to problems initially with insurance    The pt represents now for reeval  She says breathing is OK  No edema  Eating OK  No CP Last "syncopal" spell was 1/3  With daughter at the time  Daughter says it looked like she had a short seizure   Occurred in am (all spells have)  Eyes rolled back  Some seizure like motion  Event stapped when daughter shook her   Pt was not confused or groggy after   Felt OK      Current Meds  Medication Sig  . acetaminophen (TYLENOL) 500 MG tablet Take 1,000 mg by mouth 2 (two) times daily.   Marland Kitchen aspirin EC 81 MG tablet Take 1 tablet (81 mg total) by mouth daily.  . carvedilol (COREG) 3.125 MG tablet Take 3.125 mg by mouth 2 (two) times daily with a meal.  . cholecalciferol (VITAMIN D) 1000 UNITS tablet Take 500 Units by mouth daily.   . diclofenac sodium (VOLTAREN) 1 % GEL Apply 2 g 4 (four) times daily topically. On the left knee for pain  . pravastatin (PRAVACHOL) 40 MG tablet Take 40 mg by mouth every evening.   . sacubitril-valsartan (ENTRESTO) 24-26 MG Take 1 tablet by mouth 2 (two) times daily.  Marland Kitchen spironolactone (ALDACTONE) 25 MG  tablet Take 12.5 mg by mouth daily.  Marland Kitchen torsemide (DEMADEX) 20 MG tablet Take 40 mg in the AM by mouth four times weekly, Sunday, Monday , Tuesday and Wednesday     Allergies:   Codeine; Levofloxacin; Neospect [dyphylline-ephed-pb-gg or]; Spironolactone; Tramadol; and Neosporin [neomycin-bacitracin zn-polymyx]   Past Medical History:  Diagnosis Date  . AK (actinic keratosis)   . Allergy   . Chronic dermatitis    face, allergic to many products  . GERD (gastroesophageal reflux disease)   . Hyperlipidemia   . Hypertension   . Kidney stones   . Overactive bladder   . Sleep apnea   . Syncope     Past Surgical History:  Procedure Laterality Date  . broken nose    . COLONOSCOPY  2004     Social History:  The patient  reports that she has quit smoking. Her smoking use included cigarettes. she has never used smokeless tobacco. She reports that she does not drink alcohol or use drugs.   Family History:  The patient's family history includes Cancer in her mother;  Coronary artery disease in her father.    ROS:  Please see the history of present illness. All other systems are reviewed and  Negative to the above problem except as noted.    PHYSICAL EXAM: VS:  BP (!) 110/52   Pulse 80   Ht 5\' 2"  (1.575 m)   Wt 158 lb 6.4 oz (71.8 kg)   BMI 28.97 kg/m   GEN: Pt is , in no acute distress  HEENT: normal  Neck: no JVD, carotid bruits, or masses Cardiac: RRR; no murmurs, rubs, or gallops,no edema  Respiratory:  clear to auscultation bilaterally, normal work of breathing GI: soft, nontender, nondistended, + BS  No hepatomegaly  MS: no deformity Moving all extremities   Skin: warm and dry, no rash Neuro:  Strength and sensation are intact Psych: euthymic mood, full affect   EKG:  EKG is not ordered today.   Lipid Panel    Component Value Date/Time   CHOL 183 05/09/2011 0955   TRIG 103.0 05/09/2011 0955   HDL 60.90 05/09/2011 0955   CHOLHDL 3 05/09/2011 0955   VLDL 20.6  05/09/2011 0955   LDLCALC 102 (H) 05/09/2011 0955      Wt Readings from Last 3 Encounters:  07/27/17 158 lb 6.4 oz (71.8 kg)  06/24/17 165 lb 3.2 oz (74.9 kg)  06/18/17 165 lb 8 oz (75.1 kg)      ASSESSMENT AND PLAN:  1  Chronic systolic CHF   Probable ischemic.  Plan for conservative Rx   Volume status looks good today  Pt comfortable   Off of anticoagulation.  No definite thrombus noted but smoky in 2017  This is reasonable given age and syncope   Check labs  2   ? Syncope   Daughter says that pt has had about 4 spells   She said seizure like  SHort lived    Will review with Odessa FlemingS Klein  3  CAD  Probable with regional wall motion changes on echo  Pt denies CP  4   HL  Continue statin      Current medicines are reviewed at length with the patient today.  The patient does not have concerns regarding medicines.  Signed, Dietrich PatesPaula Yovani Cogburn, MD  07/27/2017 12:26 PM    [facility] [address], [place], [place]  [address] Phone: [phone]; Fax: [phone]

## 2017-07-28 ENCOUNTER — Telehealth: Payer: Self-pay

## 2017-07-28 LAB — LIPID PANEL
Chol/HDL Ratio: 2.2 ratio (ref 0.0–4.4)
Cholesterol, Total: 118 mg/dL (ref 100–199)
HDL: 54 mg/dL (ref >39)
LDL Calculated: 42 mg/dL (ref 0–99)
TRIGLYCERIDES: 109 mg/dL (ref 0–149)
VLDL CHOLESTEROL CAL: 22 mg/dL (ref 5–40)

## 2017-07-28 LAB — CBC
HEMOGLOBIN: 8.5 g/dL — AB (ref 11.1–15.9)
Hematocrit: 28.1 % — ABNORMAL LOW (ref 34.0–46.6)
MCH: 24.1 pg — AB (ref 26.6–33.0)
MCHC: 30.2 g/dL — ABNORMAL LOW (ref 31.5–35.7)
MCV: 80 fL (ref 79–97)
Platelets: 388 10*3/uL — ABNORMAL HIGH (ref 150–379)
RBC: 3.52 x10E6/uL — AB (ref 3.77–5.28)
RDW: 15.6 % — ABNORMAL HIGH (ref 12.3–15.4)
WBC: 9.1 10*3/uL (ref 3.4–10.8)

## 2017-07-28 LAB — BASIC METABOLIC PANEL
BUN/Creatinine Ratio: 21 (ref 12–28)
BUN: 28 mg/dL (ref 10–36)
CALCIUM: 9.4 mg/dL (ref 8.7–10.3)
CO2: 20 mmol/L (ref 20–29)
CREATININE: 1.36 mg/dL — AB (ref 0.57–1.00)
Chloride: 104 mmol/L (ref 96–106)
GFR calc Af Amer: 39 mL/min/{1.73_m2} — ABNORMAL LOW (ref >59)
GFR, EST NON AFRICAN AMERICAN: 34 mL/min/{1.73_m2} — AB (ref >59)
Glucose: 95 mg/dL (ref 65–99)
POTASSIUM: 4.7 mmol/L (ref 3.5–5.2)
Sodium: 139 mmol/L (ref 134–144)

## 2017-07-28 LAB — PRO B NATRIURETIC PEPTIDE: NT-PRO BNP: 625 pg/mL (ref 0–738)

## 2017-07-28 NOTE — Telephone Encounter (Signed)
While reviewing labs in triage, noticed hgb was 8.5. Reviewed with DOD (Dr. Elease HashimotoNahser), per Dr. Elease HashimotoNahser have patient follow up with primary MD.   Jeanene Erballed spoke with June patient's daughter (DPR on file). Informed her of hgb of 8.5 and to follow up with patient's primary MD. She verbalized understanding. She also stated she is waiting for a call to discuss device procedure. Informed her that I would send to Dr. Tenny Crawoss. She verbalized understanding and thanked me for the call.

## 2017-07-28 NOTE — Telephone Encounter (Signed)
CBC from [place] in Dec was not signif different  I have made recommendations from labs

## 2017-07-29 ENCOUNTER — Telehealth: Payer: Self-pay | Admitting: Internal Medicine

## 2017-07-29 DIAGNOSIS — D649 Anemia, unspecified: Secondary | ICD-10-CM

## 2017-07-29 NOTE — Telephone Encounter (Signed)
Left message to call back to schedule labs end of week or early next week.

## 2017-07-29 NOTE — Telephone Encounter (Signed)
Orders placed for BMET, CBC and amenia panel. To be scheduled.

## 2017-07-29 NOTE — Telephone Encounter (Signed)
Pt's daughter called to schedule lab work....no order to work off of.   Please place order for scheduling.  Give daughter a call back.

## 2017-07-30 NOTE — Telephone Encounter (Signed)
A lab appointment has been scheduled for 08/04/17.

## 2017-08-04 ENCOUNTER — Other Ambulatory Visit: Payer: Self-pay | Admitting: *Deleted

## 2017-08-04 ENCOUNTER — Other Ambulatory Visit: Payer: Medicare HMO

## 2017-08-04 ENCOUNTER — Telehealth: Payer: Self-pay | Admitting: Internal Medicine

## 2017-08-04 DIAGNOSIS — I509 Heart failure, unspecified: Secondary | ICD-10-CM | POA: Diagnosis not present

## 2017-08-04 DIAGNOSIS — D649 Anemia, unspecified: Secondary | ICD-10-CM

## 2017-08-04 MED ORDER — SACUBITRIL-VALSARTAN 24-26 MG PO TABS: 1.0000 | ORAL_TABLET | Freq: Two times a day (BID) | ORAL | 3 refills | Status: DC

## 2017-08-04 NOTE — Telephone Encounter (Signed)
New message     *STAT* If patient is at the pharmacy, call can be transferred to refill team.   1. Which medications need to be refilled? (please list name of each medication and dose if known) entresto 24-26 mg  2. Which pharmacy/location (including street and city if local pharmacy) is medication to be sent to? [facility]  3. Do they need a 30 day or 90 day supply? 90 day

## 2017-08-05 ENCOUNTER — Telehealth: Payer: Self-pay | Admitting: *Deleted

## 2017-08-05 DIAGNOSIS — D509 Iron deficiency anemia, unspecified: Secondary | ICD-10-CM

## 2017-08-05 LAB — BASIC METABOLIC PANEL
BUN/Creatinine Ratio: 22 (ref 12–28)
BUN: 27 mg/dL (ref 10–36)
CALCIUM: 9.2 mg/dL (ref 8.7–10.3)
CO2: 20 mmol/L (ref 20–29)
CREATININE: 1.25 mg/dL — AB (ref 0.57–1.00)
Chloride: 102 mmol/L (ref 96–106)
GFR calc Af Amer: 43 mL/min/{1.73_m2} — ABNORMAL LOW (ref >59)
GFR calc non Af Amer: 38 mL/min/{1.73_m2} — ABNORMAL LOW (ref >59)
GLUCOSE: 90 mg/dL (ref 65–99)
Potassium: 4.5 mmol/L (ref 3.5–5.2)
Sodium: 139 mmol/L (ref 134–144)

## 2017-08-05 LAB — CBC
HEMATOCRIT: 28.3 % — AB (ref 34.0–46.6)
HEMOGLOBIN: 8.5 g/dL — AB (ref 11.1–15.9)
MCH: 23 pg — ABNORMAL LOW (ref 26.6–33.0)
MCHC: 30 g/dL — ABNORMAL LOW (ref 31.5–35.7)
MCV: 77 fL — ABNORMAL LOW (ref 79–97)
Platelets: 377 10*3/uL (ref 150–379)
RBC: 3.7 x10E6/uL — AB (ref 3.77–5.28)
RDW: 16 % — ABNORMAL HIGH (ref 12.3–15.4)
WBC: 7.7 10*3/uL (ref 3.4–10.8)

## 2017-08-05 LAB — IRON AND TIBC
IRON: 18 ug/dL — AB (ref 27–139)
Iron Saturation: 4 % — CL (ref 15–55)
TIBC: 414 ug/dL (ref 250–450)
UIBC: 396 ug/dL — AB (ref 118–369)

## 2017-08-05 LAB — FOLATE: FOLATE: 15.8 ng/mL (ref >3.0)

## 2017-08-05 LAB — FERRITIN: Ferritin: 6 ng/mL — ABNORMAL LOW (ref 15–150)

## 2017-08-05 LAB — VITAMIN B12: VITAMIN B 12: 362 pg/mL (ref 232–1245)

## 2017-08-05 MED ORDER — SACUBITRIL-VALSARTAN 24-26 MG PO TABS: 1.0000 | ORAL_TABLET | Freq: Two times a day (BID) | ORAL | 0 refills | Status: DC

## 2017-08-05 MED ORDER — FERROUS SULFATE 325 (65 FE) MG PO TABS: 325.0000 mg | ORAL_TABLET | Freq: Two times a day (BID) | ORAL | 11 refills | Status: DC

## 2017-08-05 NOTE — Telephone Encounter (Signed)
Aware pt is to hold aspirin. Placed on hold in med list.

## 2017-08-05 NOTE — Telephone Encounter (Signed)
-----   Message from Dietrich PatesPaula Ross V, MD sent at 08/05/2017  3:34 PM EST ----- Hgb is stable  From 1 wk ago and from December  She is iron deficient Need to supplement with FeSO4 324 mg bid  Hold Aspirin for now. Repeat CBC in 2 wks   Forward results to primary MD

## 2017-08-05 NOTE — Telephone Encounter (Signed)
Reviewed results with patient's daughter.  Will start iron two times daily.  CBC will be 08/24/17. Reviewed that Entresto copay may be unaffordable ($246 / 90 days).  Discussed 30 day free voucher card and then [employer].  Pt's daughter will look into this.  I have mailed 30 day voucher and sent 1 prescription for this to [facility].

## 2017-08-18 ENCOUNTER — Other Ambulatory Visit: Payer: Medicare HMO

## 2017-08-18 DIAGNOSIS — D509 Iron deficiency anemia, unspecified: Secondary | ICD-10-CM | POA: Diagnosis not present

## 2017-08-18 LAB — CBC
Hematocrit: 30.7 % — ABNORMAL LOW (ref 34.0–46.6)
Hemoglobin: 9.6 g/dL — ABNORMAL LOW (ref 11.1–15.9)
MCH: 24.9 pg — ABNORMAL LOW (ref 26.6–33.0)
MCHC: 31.3 g/dL — AB (ref 31.5–35.7)
MCV: 80 fL (ref 79–97)
Platelets: 272 10*3/uL (ref 150–379)
RBC: 3.86 x10E6/uL (ref 3.77–5.28)
RDW: 19.6 % — ABNORMAL HIGH (ref 12.3–15.4)
WBC: 7.3 10*3/uL (ref 3.4–10.8)

## 2017-08-19 ENCOUNTER — Other Ambulatory Visit: Payer: Self-pay | Admitting: *Deleted

## 2017-08-19 DIAGNOSIS — D509 Iron deficiency anemia, unspecified: Secondary | ICD-10-CM

## 2017-08-21 DIAGNOSIS — M6281 Muscle weakness (generalized): Secondary | ICD-10-CM | POA: Diagnosis not present

## 2017-08-21 DIAGNOSIS — I5022 Chronic systolic (congestive) heart failure: Secondary | ICD-10-CM | POA: Diagnosis not present

## 2017-08-24 ENCOUNTER — Other Ambulatory Visit: Payer: Medicare HMO

## 2017-08-25 DIAGNOSIS — M6281 Muscle weakness (generalized): Secondary | ICD-10-CM | POA: Diagnosis not present

## 2017-08-25 DIAGNOSIS — I5022 Chronic systolic (congestive) heart failure: Secondary | ICD-10-CM | POA: Diagnosis not present

## 2017-08-27 DIAGNOSIS — I5022 Chronic systolic (congestive) heart failure: Secondary | ICD-10-CM | POA: Diagnosis not present

## 2017-08-27 DIAGNOSIS — M6281 Muscle weakness (generalized): Secondary | ICD-10-CM | POA: Diagnosis not present

## 2017-08-31 DIAGNOSIS — I5022 Chronic systolic (congestive) heart failure: Secondary | ICD-10-CM | POA: Diagnosis not present

## 2017-08-31 DIAGNOSIS — M6281 Muscle weakness (generalized): Secondary | ICD-10-CM | POA: Diagnosis not present

## 2017-09-16 ENCOUNTER — Other Ambulatory Visit: Payer: Medicare HMO | Admitting: *Deleted

## 2017-09-16 ENCOUNTER — Other Ambulatory Visit: Payer: Self-pay | Admitting: Internal Medicine

## 2017-09-16 DIAGNOSIS — D509 Iron deficiency anemia, unspecified: Secondary | ICD-10-CM | POA: Diagnosis not present

## 2017-09-16 LAB — CBC
HEMATOCRIT: 40 % (ref 34.0–46.6)
Hemoglobin: 12.6 g/dL (ref 11.1–15.9)
MCH: 26.4 pg — AB (ref 26.6–33.0)
MCHC: 31.5 g/dL (ref 31.5–35.7)
MCV: 84 fL (ref 79–97)
Platelets: 260 10*3/uL (ref 150–379)
RBC: 4.77 x10E6/uL (ref 3.77–5.28)
RDW: 22.3 % — ABNORMAL HIGH (ref 12.3–15.4)
WBC: 7.7 10*3/uL (ref 3.4–10.8)

## 2017-09-17 ENCOUNTER — Telehealth: Payer: Self-pay | Admitting: Family Medicine

## 2017-09-17 ENCOUNTER — Other Ambulatory Visit: Payer: Self-pay | Admitting: *Deleted

## 2017-09-17 DIAGNOSIS — D509 Iron deficiency anemia, unspecified: Secondary | ICD-10-CM

## 2017-09-17 DIAGNOSIS — R279 Unspecified lack of coordination: Secondary | ICD-10-CM | POA: Diagnosis not present

## 2017-09-17 DIAGNOSIS — M6281 Muscle weakness (generalized): Secondary | ICD-10-CM | POA: Diagnosis not present

## 2017-09-17 DIAGNOSIS — I5022 Chronic systolic (congestive) heart failure: Secondary | ICD-10-CM | POA: Diagnosis not present

## 2017-09-17 MED ORDER — FERROUS SULFATE 325 (65 FE) MG PO TABS: 325.0000 mg | ORAL_TABLET | Freq: Every day | ORAL | 11 refills | Status: DC

## 2017-09-17 NOTE — Telephone Encounter (Signed)
I saw the notes on the labs re: hgb and iron.  Would recheck cbc and iron in about 6 weeks.  I put in the orders.  Thanks.

## 2017-09-17 NOTE — Progress Notes (Signed)
Per lab results:  Notes recorded by Pricilla Riffleoss, Paula V, MD on 09/17/2017 at 1:58 PM EDT CBC is normal Cut back on iron supplemnt to 1 time pre day Forward to primary MD for long term follow up.

## 2017-09-18 NOTE — Telephone Encounter (Signed)
Daughter advised and will get labs done at the [place] office in 6 weeks.

## 2017-09-22 DIAGNOSIS — I5022 Chronic systolic (congestive) heart failure: Secondary | ICD-10-CM | POA: Diagnosis not present

## 2017-09-22 DIAGNOSIS — R279 Unspecified lack of coordination: Secondary | ICD-10-CM | POA: Diagnosis not present

## 2017-09-22 DIAGNOSIS — M6281 Muscle weakness (generalized): Secondary | ICD-10-CM | POA: Diagnosis not present

## 2017-09-23 ENCOUNTER — Other Ambulatory Visit (INDEPENDENT_AMBULATORY_CARE_PROVIDER_SITE_OTHER): Payer: Medicare HMO

## 2017-09-23 DIAGNOSIS — D509 Iron deficiency anemia, unspecified: Secondary | ICD-10-CM | POA: Diagnosis not present

## 2017-09-23 DIAGNOSIS — D729 Disorder of white blood cells, unspecified: Secondary | ICD-10-CM | POA: Diagnosis not present

## 2017-09-23 LAB — IBC PANEL
IRON: 54 ug/dL (ref 42–145)
Saturation Ratios: 14.7 % — ABNORMAL LOW (ref 20.0–50.0)
TRANSFERRIN: 262 mg/dL (ref 212.0–360.0)

## 2017-09-23 LAB — CBC WITH DIFFERENTIAL/PLATELET
BASOS ABS: 0.1 10*3/uL (ref 0.0–0.1)
Basophils Relative: 1.5 % (ref 0.0–3.0)
EOS ABS: 0.5 10*3/uL (ref 0.0–0.7)
Eosinophils Relative: 5.4 % — ABNORMAL HIGH (ref 0.0–5.0)
HEMATOCRIT: 40.5 % (ref 36.0–46.0)
Hemoglobin: 12.8 g/dL (ref 12.0–15.0)
LYMPHS ABS: 2.6 10*3/uL (ref 0.7–4.0)
LYMPHS PCT: 29.7 % (ref 12.0–46.0)
MCHC: 31.6 g/dL (ref 30.0–36.0)
MCV: 85.2 fl (ref 78.0–100.0)
MONO ABS: 0.8 10*3/uL (ref 0.1–1.0)
Monocytes Relative: 9.6 % (ref 3.0–12.0)
NEUTROS ABS: 4.6 10*3/uL (ref 1.4–7.7)
NEUTROS PCT: 53.8 % (ref 43.0–77.0)
PLATELETS: 243 10*3/uL (ref 150.0–400.0)
RBC: 4.75 Mil/uL (ref 3.87–5.11)
RDW: 24.2 % — AB (ref 11.5–15.5)
WBC: 8.6 10*3/uL (ref 4.0–10.5)

## 2017-09-24 LAB — PATHOLOGIST SMEAR REVIEW

## 2017-09-27 ENCOUNTER — Other Ambulatory Visit: Payer: Self-pay | Admitting: Family Medicine

## 2017-09-27 DIAGNOSIS — D509 Iron deficiency anemia, unspecified: Secondary | ICD-10-CM

## 2017-09-28 ENCOUNTER — Telehealth: Payer: Self-pay | Admitting: Internal Medicine

## 2017-09-28 NOTE — Telephone Encounter (Signed)
Patient called in SinceJan 2  has had no furhter "syncopal" spells Family wants to know if still needs LINQ  Will review with steve K.     Note, she was noted to be anemic in Feb   Getting Fe

## 2017-09-29 DIAGNOSIS — M6281 Muscle weakness (generalized): Secondary | ICD-10-CM | POA: Diagnosis not present

## 2017-09-29 DIAGNOSIS — R279 Unspecified lack of coordination: Secondary | ICD-10-CM | POA: Diagnosis not present

## 2017-09-29 DIAGNOSIS — I5022 Chronic systolic (congestive) heart failure: Secondary | ICD-10-CM | POA: Diagnosis not present

## 2017-10-04 NOTE — Telephone Encounter (Signed)
Syncope is recurrent== with syncope x 2 that number is 40% +     Risk of ILR  is low,  Would suggest yes

## 2017-10-05 NOTE — Telephone Encounter (Signed)
Reviewed with Odessa FlemingS Klein question "Does pt need LINQ?"   Family had asked He says yest given history of spells and it can be intermittent Arrange for this if pt agrees.

## 2017-10-06 DIAGNOSIS — I5022 Chronic systolic (congestive) heart failure: Secondary | ICD-10-CM | POA: Diagnosis not present

## 2017-10-06 DIAGNOSIS — M6281 Muscle weakness (generalized): Secondary | ICD-10-CM | POA: Diagnosis not present

## 2017-10-06 DIAGNOSIS — R279 Unspecified lack of coordination: Secondary | ICD-10-CM | POA: Diagnosis not present

## 2017-10-08 NOTE — Telephone Encounter (Signed)
Left detailed message on daughter [name]'s voice mail. Advised that Dr. Charlott Rakesoss's discussion with Dr. Graciela HusbandsKlein resulted in Dr. Graciela HusbandsKlein suggesting LINQ be placed. Advised that if that is what the patient would like to do that she should let us know and we will arrange.  Advised I would forward this information to Dr. Odessa FlemingKlein's nurse in the event they elect to proceed with procedure.   Asked that she call back to let us know.

## 2017-10-13 DIAGNOSIS — M6281 Muscle weakness (generalized): Secondary | ICD-10-CM | POA: Diagnosis not present

## 2017-10-13 DIAGNOSIS — R279 Unspecified lack of coordination: Secondary | ICD-10-CM | POA: Diagnosis not present

## 2017-10-13 DIAGNOSIS — I5022 Chronic systolic (congestive) heart failure: Secondary | ICD-10-CM | POA: Diagnosis not present

## 2017-10-15 ENCOUNTER — Other Ambulatory Visit: Payer: Self-pay

## 2017-10-15 MED ORDER — PRAVASTATIN SODIUM 40 MG PO TABS: 40.0000 mg | ORAL_TABLET | Freq: Every evening | ORAL | 2 refills | Status: DC

## 2017-10-15 MED ORDER — SPIRONOLACTONE 25 MG PO TABS: 12.5000 mg | ORAL_TABLET | Freq: Every day | ORAL | 2 refills | Status: DC

## 2017-10-15 NOTE — Telephone Encounter (Signed)
Ok to refill both?? 

## 2017-10-15 NOTE — Telephone Encounter (Signed)
Okay to refill these? Dr Tenny Crawoss has never refilled them. Please advise. Thanks, MI

## 2017-10-20 DIAGNOSIS — M6281 Muscle weakness (generalized): Secondary | ICD-10-CM | POA: Diagnosis not present

## 2017-10-20 DIAGNOSIS — R279 Unspecified lack of coordination: Secondary | ICD-10-CM | POA: Diagnosis not present

## 2017-10-20 DIAGNOSIS — I5022 Chronic systolic (congestive) heart failure: Secondary | ICD-10-CM | POA: Diagnosis not present

## 2017-11-02 ENCOUNTER — Telehealth: Payer: Self-pay | Admitting: Internal Medicine

## 2017-11-02 MED ORDER — CARVEDILOL 3.125 MG PO TABS: 3.1250 mg | ORAL_TABLET | Freq: Two times a day (BID) | ORAL | 3 refills | Status: DC

## 2017-11-02 NOTE — Telephone Encounter (Signed)
Patient's daughter filling med box; wanted to confirm medications. Meds reviewed with her. Aspirin is still on hold as rec by Dr. Tenny Craw due to cbc results in Feb. Taking iron now and having repeat studies by Dr. Para March.  Per daughter patient is doing well. Going to PT once a week, walking, in independent living at [place] still. They are not planning LINQ at this time. They feel that pt's episodes were around times of stress, sleep issues or not drinking enough water.  Scheduled 42m ov with Dr. Tenny Craw in September.  Aware I will am forwarding to her and Dr. Graciela Husbands as [place] Childes.

## 2017-11-02 NOTE — Telephone Encounter (Signed)
New message  Pt daughter verbalzied that she is calling for RN  She wants rn to go over her mothers medications she said   she is new at sorting out her meds

## 2017-11-04 ENCOUNTER — Other Ambulatory Visit (INDEPENDENT_AMBULATORY_CARE_PROVIDER_SITE_OTHER): Payer: Medicare HMO

## 2017-11-04 DIAGNOSIS — D509 Iron deficiency anemia, unspecified: Secondary | ICD-10-CM

## 2017-11-04 LAB — CBC WITH DIFFERENTIAL/PLATELET
BASOS PCT: 2.5 % (ref 0.0–3.0)
Basophils Absolute: 0.2 10*3/uL — ABNORMAL HIGH (ref 0.0–0.1)
EOS PCT: 5.2 % — AB (ref 0.0–5.0)
Eosinophils Absolute: 0.4 10*3/uL (ref 0.0–0.7)
HCT: 42.2 % (ref 36.0–46.0)
Hemoglobin: 13.7 g/dL (ref 12.0–15.0)
LYMPHS ABS: 2.2 10*3/uL (ref 0.7–4.0)
Lymphocytes Relative: 29.1 % (ref 12.0–46.0)
MCHC: 32.4 g/dL (ref 30.0–36.0)
MCV: 86.3 fl (ref 78.0–100.0)
MONO ABS: 0.8 10*3/uL (ref 0.1–1.0)
Monocytes Relative: 10.4 % (ref 3.0–12.0)
NEUTROS PCT: 52.8 % (ref 43.0–77.0)
Neutro Abs: 4.1 10*3/uL (ref 1.4–7.7)
Platelets: 242 10*3/uL (ref 150.0–400.0)
RBC: 4.89 Mil/uL (ref 3.87–5.11)
RDW: 20.5 % — AB (ref 11.5–15.5)
WBC: 7.7 10*3/uL (ref 4.0–10.5)

## 2017-11-04 LAB — IBC PANEL
Iron: 68 ug/dL (ref 42–145)
SATURATION RATIOS: 16.9 % — AB (ref 20.0–50.0)
Transferrin: 288 mg/dL (ref 212.0–360.0)

## 2017-11-13 ENCOUNTER — Other Ambulatory Visit: Payer: Self-pay

## 2017-11-13 ENCOUNTER — Inpatient Hospital Stay (HOSPITAL_COMMUNITY)
Admission: EM | Admit: 2017-11-13 | Discharge: 2017-11-15 | DRG: 176 | Disposition: A | Discharge status: Home / Self Care | Payer: Medicare HMO | Attending: Internal Medicine | Admitting: Internal Medicine

## 2017-11-13 ENCOUNTER — Telehealth: Payer: Self-pay | Admitting: Internal Medicine

## 2017-11-13 ENCOUNTER — Emergency Department (HOSPITAL_COMMUNITY): Payer: Medicare HMO

## 2017-11-13 ENCOUNTER — Encounter (HOSPITAL_COMMUNITY): Payer: Self-pay | Admitting: Emergency Medicine

## 2017-11-13 DIAGNOSIS — I700 Atherosclerosis of aorta: Secondary | ICD-10-CM | POA: Diagnosis present

## 2017-11-13 DIAGNOSIS — I110 Hypertensive heart disease with heart failure: Secondary | ICD-10-CM | POA: Diagnosis present

## 2017-11-13 DIAGNOSIS — M25562 Pain in left knee: Secondary | ICD-10-CM | POA: Diagnosis present

## 2017-11-13 DIAGNOSIS — R0902 Hypoxemia: Secondary | ICD-10-CM | POA: Diagnosis not present

## 2017-11-13 DIAGNOSIS — Z7982 Long term (current) use of aspirin: Secondary | ICD-10-CM

## 2017-11-13 DIAGNOSIS — S299XXA Unspecified injury of thorax, initial encounter: Secondary | ICD-10-CM | POA: Diagnosis not present

## 2017-11-13 DIAGNOSIS — I2609 Other pulmonary embolism with acute cor pulmonale: Principal | ICD-10-CM | POA: Diagnosis present

## 2017-11-13 DIAGNOSIS — Z7901 Long term (current) use of anticoagulants: Secondary | ICD-10-CM | POA: Diagnosis not present

## 2017-11-13 DIAGNOSIS — M5030 Other cervical disc degeneration, unspecified cervical region: Secondary | ICD-10-CM | POA: Diagnosis present

## 2017-11-13 DIAGNOSIS — R42 Dizziness and giddiness: Secondary | ICD-10-CM | POA: Diagnosis present

## 2017-11-13 DIAGNOSIS — N3281 Overactive bladder: Secondary | ICD-10-CM | POA: Diagnosis present

## 2017-11-13 DIAGNOSIS — S199XXA Unspecified injury of neck, initial encounter: Secondary | ICD-10-CM | POA: Diagnosis not present

## 2017-11-13 DIAGNOSIS — I361 Nonrheumatic tricuspid (valve) insufficiency: Secondary | ICD-10-CM

## 2017-11-13 DIAGNOSIS — I10 Essential (primary) hypertension: Secondary | ICD-10-CM | POA: Diagnosis present

## 2017-11-13 DIAGNOSIS — I2510 Atherosclerotic heart disease of native coronary artery without angina pectoris: Secondary | ICD-10-CM | POA: Diagnosis present

## 2017-11-13 DIAGNOSIS — Z79899 Other long term (current) drug therapy: Secondary | ICD-10-CM

## 2017-11-13 DIAGNOSIS — R748 Abnormal levels of other serum enzymes: Secondary | ICD-10-CM | POA: Diagnosis present

## 2017-11-13 DIAGNOSIS — Z881 Allergy status to other antibiotic agents status: Secondary | ICD-10-CM | POA: Diagnosis not present

## 2017-11-13 DIAGNOSIS — G4733 Obstructive sleep apnea (adult) (pediatric): Secondary | ICD-10-CM | POA: Diagnosis present

## 2017-11-13 DIAGNOSIS — N318 Other neuromuscular dysfunction of bladder: Secondary | ICD-10-CM | POA: Diagnosis not present

## 2017-11-13 DIAGNOSIS — I82403 Acute embolism and thrombosis of unspecified deep veins of lower extremity, bilateral: Secondary | ICD-10-CM | POA: Diagnosis present

## 2017-11-13 DIAGNOSIS — I959 Hypotension, unspecified: Secondary | ICD-10-CM | POA: Diagnosis not present

## 2017-11-13 DIAGNOSIS — I5042 Chronic combined systolic (congestive) and diastolic (congestive) heart failure: Secondary | ICD-10-CM | POA: Diagnosis present

## 2017-11-13 DIAGNOSIS — M1990 Unspecified osteoarthritis, unspecified site: Secondary | ICD-10-CM | POA: Diagnosis present

## 2017-11-13 DIAGNOSIS — I2699 Other pulmonary embolism without acute cor pulmonale: Secondary | ICD-10-CM

## 2017-11-13 DIAGNOSIS — Z888 Allergy status to other drugs, medicaments and biological substances status: Secondary | ICD-10-CM

## 2017-11-13 DIAGNOSIS — R55 Syncope and collapse: Secondary | ICD-10-CM | POA: Diagnosis not present

## 2017-11-13 DIAGNOSIS — G8929 Other chronic pain: Secondary | ICD-10-CM | POA: Diagnosis present

## 2017-11-13 DIAGNOSIS — Z23 Encounter for immunization: Secondary | ICD-10-CM

## 2017-11-13 DIAGNOSIS — Z885 Allergy status to narcotic agent status: Secondary | ICD-10-CM

## 2017-11-13 DIAGNOSIS — S0993XA Unspecified injury of face, initial encounter: Secondary | ICD-10-CM | POA: Diagnosis not present

## 2017-11-13 DIAGNOSIS — I2690 Septic pulmonary embolism without acute cor pulmonale: Secondary | ICD-10-CM | POA: Diagnosis not present

## 2017-11-13 DIAGNOSIS — G4730 Sleep apnea, unspecified: Secondary | ICD-10-CM | POA: Diagnosis not present

## 2017-11-13 DIAGNOSIS — W19XXXA Unspecified fall, initial encounter: Secondary | ICD-10-CM | POA: Diagnosis not present

## 2017-11-13 DIAGNOSIS — R0602 Shortness of breath: Secondary | ICD-10-CM | POA: Diagnosis not present

## 2017-11-13 DIAGNOSIS — K219 Gastro-esophageal reflux disease without esophagitis: Secondary | ICD-10-CM | POA: Diagnosis present

## 2017-11-13 DIAGNOSIS — Z87891 Personal history of nicotine dependence: Secondary | ICD-10-CM

## 2017-11-13 DIAGNOSIS — S0181XA Laceration without foreign body of other part of head, initial encounter: Secondary | ICD-10-CM | POA: Diagnosis not present

## 2017-11-13 DIAGNOSIS — R220 Localized swelling, mass and lump, head: Secondary | ICD-10-CM | POA: Diagnosis not present

## 2017-11-13 DIAGNOSIS — E785 Hyperlipidemia, unspecified: Secondary | ICD-10-CM | POA: Diagnosis present

## 2017-11-13 DIAGNOSIS — S0990XA Unspecified injury of head, initial encounter: Secondary | ICD-10-CM | POA: Diagnosis not present

## 2017-11-13 DIAGNOSIS — S0512XA Contusion of eyeball and orbital tissues, left eye, initial encounter: Secondary | ICD-10-CM | POA: Diagnosis not present

## 2017-11-13 LAB — ECHOCARDIOGRAM COMPLETE
HEIGHTINCHES: 62 in
Weight: 2480 oz

## 2017-11-13 LAB — CBC WITH DIFFERENTIAL/PLATELET
ABS IMMATURE GRANULOCYTES: 0.1 10*3/uL (ref 0.0–0.1)
BASOS ABS: 0.1 10*3/uL (ref 0.0–0.1)
BASOS PCT: 0 %
Eosinophils Absolute: 0.2 10*3/uL (ref 0.0–0.7)
Eosinophils Relative: 2 %
HCT: 42.7 % (ref 36.0–46.0)
HEMOGLOBIN: 13.2 g/dL (ref 12.0–15.0)
Immature Granulocytes: 1 %
LYMPHS PCT: 14 %
Lymphs Abs: 2 10*3/uL (ref 0.7–4.0)
MCH: 27.2 pg (ref 26.0–34.0)
MCHC: 30.9 g/dL (ref 30.0–36.0)
MCV: 87.9 fL (ref 78.0–100.0)
MONO ABS: 0.9 10*3/uL (ref 0.1–1.0)
Monocytes Relative: 6 %
NEUTROS ABS: 11.5 10*3/uL — AB (ref 1.7–7.7)
Neutrophils Relative %: 77 %
Platelets: 183 10*3/uL (ref 150–400)
RBC: 4.86 MIL/uL (ref 3.87–5.11)
RDW: 18 % — ABNORMAL HIGH (ref 11.5–15.5)
WBC: 14.8 10*3/uL — AB (ref 4.0–10.5)

## 2017-11-13 LAB — BASIC METABOLIC PANEL
Anion gap: 10 (ref 5–15)
BUN: 26 mg/dL — AB (ref 6–20)
CHLORIDE: 107 mmol/L (ref 101–111)
CO2: 23 mmol/L (ref 22–32)
CREATININE: 1.34 mg/dL — AB (ref 0.44–1.00)
Calcium: 8.9 mg/dL (ref 8.9–10.3)
GFR, EST AFRICAN AMERICAN: 39 mL/min — AB (ref >60)
GFR, EST NON AFRICAN AMERICAN: 33 mL/min — AB (ref >60)
Glucose, Bld: 149 mg/dL — ABNORMAL HIGH (ref 65–99)
POTASSIUM: 4.5 mmol/L (ref 3.5–5.1)
SODIUM: 140 mmol/L (ref 135–145)

## 2017-11-13 LAB — CBC
HEMATOCRIT: 42.3 % (ref 36.0–46.0)
HEMOGLOBIN: 12.9 g/dL (ref 12.0–15.0)
MCH: 27.6 pg (ref 26.0–34.0)
MCHC: 30.5 g/dL (ref 30.0–36.0)
MCV: 90.4 fL (ref 78.0–100.0)
Platelets: 163 10*3/uL (ref 150–400)
RBC: 4.68 MIL/uL (ref 3.87–5.11)
RDW: 18.1 % — ABNORMAL HIGH (ref 11.5–15.5)
WBC: 9.8 10*3/uL (ref 4.0–10.5)

## 2017-11-13 LAB — TROPONIN I: Troponin I: 0.07 ng/mL (ref <0.03)

## 2017-11-13 LAB — BRAIN NATRIURETIC PEPTIDE: B NATRIURETIC PEPTIDE 5: 180.4 pg/mL — AB (ref 0.0–100.0)

## 2017-11-13 LAB — MRSA PCR SCREENING: MRSA BY PCR: NEGATIVE

## 2017-11-13 MED ORDER — ONDANSETRON HCL 4 MG PO TABS: 4.0000 mg | ORAL_TABLET | Freq: Four times a day (QID) | ORAL | Status: DC | PRN

## 2017-11-13 MED ORDER — CARVEDILOL 3.125 MG PO TABS
3.1250 mg | ORAL_TABLET | Freq: Two times a day (BID) | ORAL | Status: DC
  Administered 2017-11-13 – 2017-11-15 (×3): 3.125 mg via ORAL
  Filled 2017-11-13 (×3): qty 1

## 2017-11-13 MED ORDER — HYDROCODONE-ACETAMINOPHEN 5-325 MG PO TABS: 1.0000 | ORAL_TABLET | ORAL | Status: DC | PRN

## 2017-11-13 MED ORDER — TETANUS-DIPHTH-ACELL PERTUSSIS 5-2.5-18.5 LF-MCG/0.5 IM SUSP
0.5000 mL | Freq: Once | INTRAMUSCULAR | Status: AC
  Administered 2017-11-13: 0.5 mL via INTRAMUSCULAR
  Filled 2017-11-13: qty 0.5

## 2017-11-13 MED ORDER — ACETAMINOPHEN 325 MG PO TABS
650.0000 mg | ORAL_TABLET | Freq: Four times a day (QID) | ORAL | Status: DC | PRN
  Administered 2017-11-14: 650 mg via ORAL
  Filled 2017-11-13 (×2): qty 2

## 2017-11-13 MED ORDER — ONDANSETRON HCL 4 MG/2ML IJ SOLN
4.0000 mg | Freq: Once | INTRAMUSCULAR | Status: AC
  Administered 2017-11-13: 4 mg via INTRAVENOUS
  Filled 2017-11-13: qty 2

## 2017-11-13 MED ORDER — IOPAMIDOL (ISOVUE-370) INJECTION 76%
INTRAVENOUS | Status: AC
  Administered 2017-11-13: 80 mL
  Filled 2017-11-13: qty 100

## 2017-11-13 MED ORDER — ACETAMINOPHEN 325 MG PO TABS
650.0000 mg | ORAL_TABLET | Freq: Once | ORAL | Status: AC
  Administered 2017-11-13: 650 mg via ORAL
  Filled 2017-11-13: qty 2

## 2017-11-13 MED ORDER — TORSEMIDE 20 MG PO TABS: 20.0000 mg | ORAL_TABLET | Freq: Every day | ORAL | Status: DC

## 2017-11-13 MED ORDER — LIDOCAINE-EPINEPHRINE 2 %-1:200000 IJ SOLN
10.0000 mL | Freq: Once | INTRAMUSCULAR | Status: AC
Start: 2017-11-13 — End: 2017-11-13
  Administered 2017-11-13: 10 mL via INTRADERMAL
  Filled 2017-11-13: qty 20

## 2017-11-13 MED ORDER — PERFLUTREN LIPID MICROSPHERE
1.0000 mL | INTRAVENOUS | Status: AC | PRN
  Filled 2017-11-13: qty 10

## 2017-11-13 MED ORDER — SODIUM CHLORIDE 0.9 % IV BOLUS
500.0000 mL | Freq: Once | INTRAVENOUS | Status: AC
  Administered 2017-11-13: 500 mL via INTRAVENOUS

## 2017-11-13 MED ORDER — PRAVASTATIN SODIUM 40 MG PO TABS
40.0000 mg | ORAL_TABLET | Freq: Every evening | ORAL | Status: DC
  Administered 2017-11-13 – 2017-11-14 (×2): 40 mg via ORAL
  Filled 2017-11-13 (×2): qty 1

## 2017-11-13 MED ORDER — MORPHINE SULFATE (PF) 4 MG/ML IV SOLN: 1.0000 mg | INTRAVENOUS | Status: DC | PRN

## 2017-11-13 MED ORDER — SENNOSIDES-DOCUSATE SODIUM 8.6-50 MG PO TABS: 1.0000 | ORAL_TABLET | Freq: Every evening | ORAL | Status: DC | PRN

## 2017-11-13 MED ORDER — ACETAMINOPHEN 650 MG RE SUPP: 650.0000 mg | Freq: Four times a day (QID) | RECTAL | Status: DC | PRN

## 2017-11-13 MED ORDER — HEPARIN BOLUS VIA INFUSION
3500.0000 [IU] | Freq: Once | INTRAVENOUS | Status: AC
Start: 2017-11-13 — End: 2017-11-13
  Administered 2017-11-13: 3500 [IU] via INTRAVENOUS
  Filled 2017-11-13: qty 3500

## 2017-11-13 MED ORDER — ONDANSETRON HCL 4 MG/2ML IJ SOLN: 4.0000 mg | Freq: Four times a day (QID) | INTRAMUSCULAR | Status: DC | PRN

## 2017-11-13 MED ORDER — BISACODYL 10 MG RE SUPP: 10.0000 mg | Freq: Every day | RECTAL | Status: DC | PRN

## 2017-11-13 MED ORDER — HEPARIN (PORCINE) IN NACL 100-0.45 UNIT/ML-% IJ SOLN
900.0000 [IU]/h | INTRAMUSCULAR | Status: DC
  Administered 2017-11-13: 1100 [IU]/h via INTRAVENOUS
  Filled 2017-11-13: qty 250

## 2017-11-13 MED ORDER — SACUBITRIL-VALSARTAN 24-26 MG PO TABS
1.0000 | ORAL_TABLET | Freq: Two times a day (BID) | ORAL | Status: DC
  Administered 2017-11-14: 1 via ORAL
  Filled 2017-11-13 (×4): qty 1

## 2017-11-13 NOTE — H&P (Signed)
History and Physical    [name] GNF:[id number] DOB: Feb 17, 1924 DOA: 11/13/2017   PCP: Joaquim Nam, MD   Patient coming from:  Home    Chief Complaint:  HPI: [name] is a 84 y.o. female with medical history significant for sleep apnea, systolic heart failure, pretension, hyperlipidemia, arthritis, GERD, who lives in assisted living facility, brought to the emergency department after feeling lightheaded while she was standing up, falling on her left side of her face and eyelid, and not losing consciousness.  Since falling, she felt very short of breath.  She denies any chest pain or palpitations.  The patient denies any focal weakness.  Of note, over the last few months, she has been feeling tired, and experience lower extremity swelling, mostly on the right, that she attributed to her arthritis.  She denies any pleuritic chest pain palpitations or chest wall pain.  She denies any fever, chills, night sweats or hemoptysis.  She denies any other prior presyncopal or syncopal episodes.  She denies any recent diagnosis of cancer.  She denies any tobacco abuse.  She is not on any hormonal replacement therapy.  No recent long distance trips.  She is fairly inactive.  She denies any prior history of PE or DVT.  She denies any significant amount of NSAIDs.  She is on iron supplement at home.  She has never been seen by a hematologist, or had any bowel marrow biopsy in the past.  She denies any recent infections or sick contacts.  No confusion is reported.  She is not on aspirin daily.  ED Course:  BP (!) 94/49   Pulse 77   Temp 97.8 F (36.6 C) (Oral)   Resp 17   Ht 5\' 2"  (1.575 m)   Wt 70.3 kg (155 lb)   SpO2 97%   BMI 28.35 kg/m   Sodium 140, potassium 4.5, bicarb 23, glucose 149, creatinine 1.34, with GFR 33, BN P1 180 Troponin 0 0.07 White count 14.8, hemoglobin 13.2, platelets 183. CT of the head and C-spine without fracture, or masses.  No stroke was noted. The Angio of the  chest positive for acute PE, with evidence of right heart strain, with at least submassive, intermediate risk PE.  A 2.5 mm right middle lobe nodule noted. Sinus rhythm on EKG, multiple PVCs, no new acute findings Last 2D echo in June 2017 showed EF 30 to 35%, grade 1 diastolic, moderately to severely reduced systolic.  Review of Systems:  As per HPI otherwise all other systems reviewed and are negative  Past Medical History:  Diagnosis Date  . AK (actinic keratosis)   . Allergy   . Chronic dermatitis    face, allergic to many products  . GERD (gastroesophageal reflux disease)   . Hyperlipidemia   . Hypertension   . Kidney stones   . Overactive bladder   . Sleep apnea   . Syncope     Past Surgical History:  Procedure Laterality Date  . broken nose    . COLONOSCOPY  2004    Social History Social History   Socioeconomic History  . Marital status: Widowed    Spouse name: Not on file  . Number of children: Not on file  . Years of education: Not on file  . Highest education level: Not on file  Occupational History  . Not on file  Social Needs  . Financial resource strain: Not on file  . Food insecurity:    Worry: Not on  file    Inability: Not on file  . Transportation needs:    Medical: Not on file    Non-medical: Not on file  Tobacco Use  . Smoking status: Former Smoker    Types: Cigarettes  . Smokeless tobacco: Never Used  . Tobacco comment: smoked during college  Substance and Sexual Activity  . Alcohol use: No  . Drug use: No  . Sexual activity: Not on file  Lifestyle  . Physical activity:    Days per week: Not on file    Minutes per session: Not on file  . Stress: Not on file  Relationships  . Social connections:    Talks on phone: Not on file    Gets together: Not on file    Attends religious service: Not on file    Active member of club or organization: Not on file    Attends meetings of clubs or organizations: Not on file    Relationship status:  Not on file  . Intimate partner violence:    Fear of current or ex partner: Not on file    Emotionally abused: Not on file    Physically abused: Not on file    Forced sexual activity: Not on file  Other Topics Concern  . Not on file  Social History Narrative   Widowed 2014 -husband had coronary artery disease and valvular disease   Has grand children and great grandchildren     Allergies  Allergen Reactions  . Codeine Nausea And Vomiting and Other (See Comments)    REACTION: dizzy- but can tolerate hydrocodone.   . Levofloxacin Other (See Comments)    REACTION: itching  . Neospect [Dyphylline-Ephed-Pb-Gg Or] Other (See Comments)    unknown  . Spironolactone     Hyperkalemia   . Tramadol     Didn't help with pain  . Neosporin [Neomycin-Bacitracin Zn-Polymyx] Rash    Family History  Problem Relation Age of Onset  . Cancer Mother        stomach  . Coronary artery disease Father   . Colon cancer Neg Hx       Prior to Admission medications   Medication Sig Start Date End Date Taking? Authorizing Provider  acetaminophen (TYLENOL) 500 MG tablet Take 1,000 mg by mouth 2 (two) times daily.     [provider]  aspirin EC 81 MG tablet Take 1 tablet (81 mg total) by mouth daily. Patient not taking: Reported on 08/05/2017 06/24/17   Duke Salvia, MD  carvedilol (COREG) 3.125 MG tablet Take 1 tablet (3.125 mg total) by mouth 2 (two) times daily with a meal. 11/02/17   Pricilla Riffle, MD  cholecalciferol (VITAMIN D) 1000 UNITS tablet Take 500 Units by mouth daily.     [provider]  diclofenac sodium (VOLTAREN) 1 % GEL Apply 2 g 4 (four) times daily topically. On the left knee for pain 05/01/17   Joaquim Nam, MD  ferrous sulfate 325 (65 FE) MG tablet Take 1 tablet (325 mg total) by mouth daily with breakfast. 09/17/17   Pricilla Riffle, MD  pravastatin (PRAVACHOL) 40 MG tablet Take 1 tablet (40 mg total) by mouth every evening. 10/15/17   Pricilla Riffle, MD    sacubitril-valsartan (ENTRESTO) 24-26 MG Take 1 tablet by mouth 2 (two) times daily. 08/04/17   Duke Salvia, MD  sacubitril-valsartan (ENTRESTO) 24-26 MG Take 1 tablet by mouth 2 (two) times daily. 08/05/17   Pricilla Riffle, MD  spironolactone (  ALDACTONE) 25 MG tablet Take 0.5 tablets (12.5 mg total) by mouth daily. 10/15/17   Pricilla Riffle, MD  torsemide [facility]) 20 MG tablet Take 40 mg in the AM by mouth four times weekly, Sunday, Monday , Tuesday and Wednesday    [provider]    Physical Exam:  Vitals:   11/13/17 1245 11/13/17 1315 11/13/17 1353 11/13/17 1515  BP: (!) 97/52 93/70  (!) 94/49  Pulse: 79 80  77  Resp: 11 (!) 29  17  Temp:   97.8 F (36.6 C)   TempSrc:   Oral   SpO2: 96% 92%  97%  Weight:      Height:       Constitutional: NAD, calm, ill appearing Eyes: PERRL, significant bruise on the left lid , which remains closed. ENMT: Mucous membranes are moist, without exudate or lesions  Neck: normal, supple, no masses, no thyromegaly Respiratory: clear to auscultation bilaterally, no wheezing, no crackles. Normal respiratory effort  Cardiovascular: Regular rate and rhythm,  murmur, rubs or gallops.  Right greater than left lower extremity edema. 2+ pedal pulses. No carotid bruits.  Abdomen: Soft, non tender, No hepatosplenomegaly. Bowel sounds positive.  Musculoskeletal: no clubbing / cyanosis. Moves all extremities Skin: no jaundice, No lesions.  Neurologic: Sensation intact  Strength equal in all extremities Psychiatric:   Alert and oriented x 3. Normal mood.     Labs on Admission: I have personally reviewed following labs and imaging studies  CBC: Recent Labs  Lab 11/13/17 1036  WBC 14.8*  NEUTROABS 11.5*  HGB 13.2  HCT 42.7  MCV 87.9  PLT 183    Basic Metabolic Panel: Recent Labs  Lab 11/13/17 1036  NA 140  K 4.5  CL 107  CO2 23  GLUCOSE 149*  BUN 26*  CREATININE 1.34*  CALCIUM 8.9    GFR: Estimated Creatinine Clearance: 24.6  mL/min (A) (by C-G formula based on SCr of 1.34 mg/dL (H)).  Liver Function Tests: No results for input(s): AST, ALT, ALKPHOS, BILITOT, PROT, ALBUMIN in the last 168 hours. No results for input(s): LIPASE, AMYLASE in the last 168 hours. No results for input(s): AMMONIA in the last 168 hours.  Coagulation Profile: No results for input(s): INR, PROTIME in the last 168 hours.  Cardiac Enzymes: Recent Labs  Lab 11/13/17 1036  TROPONINI 0.07*    BNP (last 3 results) Recent Labs    07/27/17 1303  PROBNP 625    HbA1C: No results for input(s): HGBA1C in the last 72 hours.  CBG: No results for input(s): GLUCAP in the last 168 hours.  Lipid Profile: No results for input(s): CHOL, HDL, LDLCALC, TRIG, CHOLHDL, LDLDIRECT in the last 72 hours.  Thyroid Function Tests: No results for input(s): TSH, T4TOTAL, FREET4, T3FREE, THYROIDAB in the last 72 hours.  Anemia Panel: No results for input(s): VITAMINB12, FOLATE, FERRITIN, TIBC, IRON, RETICCTPCT in the last 72 hours.  Urine analysis:    Component Value Date/Time   COLORURINE YELLOW 05/18/2013 1321   APPEARANCEUR CLOUDY (A) 05/18/2013 1321   LABSPEC 1.024 05/18/2013 1321   PHURINE 7.0 05/18/2013 1321   GLUCOSEU NEGATIVE 05/18/2013 1321   HGBUR NEGATIVE 05/18/2013 1321   BILIRUBINUR Neg 06/18/2017 1627   KETONESUR NEGATIVE 05/18/2013 1321   PROTEINUR 1+ 06/18/2017 1627   PROTEINUR NEGATIVE 05/18/2013 1321   UROBILINOGEN 0.2 06/18/2017 1627   UROBILINOGEN 1.0 05/18/2013 1321   NITRITE Neg 06/18/2017 1627   NITRITE NEGATIVE 05/18/2013 1321   LEUKOCYTESUR Negative 06/18/2017 1627  Sepsis Labs: @LABRCNTIP (procalcitonin:4,lacticidven:4) )No results found for this or any previous visit (from the past 240 hour(s)).   Radiological Exams on Admission: Dg Chest 2 View  Result Date: 11/13/2017 CLINICAL DATA:  Hypoxia following recent fall EXAM: CHEST - 2 VIEW COMPARISON:  05/18/2013 FINDINGS: Cardiac shadow is stable. Large  hiatal hernia is again seen. Stable aortic calcifications are noted. No focal infiltrate or sizable effusion is seen. No acute bony abnormality is noted. Midthoracic compression deformity is noted and stable. IMPRESSION: No acute abnormality noted. Chronic changes as described. Electronically Signed   By: Alcide Clever M.D.   On: 11/13/2017 11:25   Ct Head Wo Contrast  Result Date: 11/13/2017 CLINICAL DATA:  Tripped over rug hitting left side of face, now with left orbital hematoma. EXAM: CT HEAD WITHOUT CONTRAST CT MAXILLOFACIAL WITHOUT CONTRAST CT CERVICAL SPINE WITHOUT CONTRAST TECHNIQUE: Multidetector CT imaging of the head, cervical spine, and maxillofacial structures were performed using the standard protocol without intravenous contrast. Multiplanar CT image reconstructions of the cervical spine and maxillofacial structures were also generated. COMPARISON:  None. FINDINGS: CT HEAD FINDINGS Brain: Mild, likely age-appropriate atrophy with sulcal prominence. Scattered periventricular hypodensities compatible microvascular ischemic disease. No CT evidence superimposed acute large territory infarct. No intraparenchymal or extra-axial mass or hemorrhage. Mild asymmetric prominence of the occipital horn of the left lateral ventricle without associated midline shift. Vascular: Intracranial atherosclerosis. Skull: Soft tissue swelling about the left orbit without associated fracture or radiopaque foreign body. Other: Limited visualization the paranasal sinuses is normal. No air-fluid levels. Post bilateral cataract surgery. CT MAXILLOFACIAL FINDINGS Osseous: No displaced calvarial or facial fracture. No displaced nasal bone fracture. Normal appearance of the bilateral pterygoid plates. The bilateral mandibular condyles are normally located. Normal appearance of the bilateral zygomatic arches. Orbits: Post bilateral cataract surgery. Otherwise, normal appearance the bilateral globes. The left-sided periorbital  hematoma extends to involve the anterior aspect of the orbit, however there is no retro bulbar hematoma. Sinuses: Paranasal sinuses are normally aerated. No air-fluid levels. Minimal leftward nasal septal deviation. Small amount of debris is seen within the bilateral external auditory canals, left greater than right. Soft tissues: Large left-sided periorbital hematoma with dominant component measuring at least 6.0 x 1.3 cm (image 72, series 8). CT CERVICAL SPINE FINDINGS Alignment: C1 to the superior endplate of T2 is imaged. Normal alignment of the cervical spine. No anterolisthesis or retrolisthesis. The bilateral facets are normally aligned. The dens is normally positioned between the lateral masses of C1. Normal atlantodental and atlantoaxial articulations. Skull base and vertebrae: Cervical vertebral body heights appear preserved. Soft tissues and spinal canal: Prevertebral soft tissues appear normal. Disc levels: Mild multilevel cervical spine DDD, worse at C5-C6 with disc space height loss, endplate irregularity and small posteriorly directed disc osteophyte complex at this location. Upper chest: Limited visualization of lung apices is normal. Other: Regional soft tissues appear normal. No bulky cervical lymphadenopathy on this noncontrast examination. Minimal amount of calcified atherosclerotic plaque within the bilateral carotid bulbs, left greater than right. Note is made of an approximately 1.1 x 0.7 cm hypoattenuating nodule within the right-sided the thyroid isthmus (image 85, series 17), of doubtful clinical concern. IMPRESSION: 1. Soft tissue swelling about the left orbit without associated displaced calvarial or facial fracture, radiopaque foreign body or acute intracranial process. 2. No displaced fracture or static subluxation of the cervical spine. 3. Mild likely age-appropriate atrophy and microvascular ischemic disease. 4. Mild multilevel cervical spine DDD, worse at C5-C6. Electronically Signed  By: Simonne Come M.D.   On: 11/13/2017 11:16   Ct Angio Chest Pe W/cm &/or Wo Cm  Result Date: 11/13/2017 CLINICAL DATA:  Patient fell this morning with elevated troponin. EXAM: CT ANGIOGRAPHY CHEST WITH CONTRAST TECHNIQUE: Multidetector CT imaging of the chest was performed using the standard protocol during bolus administration of intravenous contrast. Multiplanar CT image reconstructions and MIPs were obtained to evaluate the vascular anatomy. CONTRAST:  80mL ISOVUE-370 IOPAMIDOL (ISOVUE-370) INJECTION 76% COMPARISON:  07/20/2008 FINDINGS: Cardiovascular: Satisfactory opacification of pulmonary arteries. Bilateral central pulmonary emboli to all lobes starting at the branch point of the right and left main pulmonary arteries. Right heart strain with RV/LV ratio 1.38. Conventional branch pattern of the great vessels with atherosclerosis. Moderate aortic atherosclerosis without aneurysm. Left main and three-vessel coronary arteriosclerosis. Mediastinum/Nodes: Moderate-sized hiatal hernia. No lymphadenopathy. Trachea and esophagus are unremarkable. No thyromegaly or mass. Lungs/Pleura: No acute pulmonary consolidation, effusion or pneumothorax. Tiny subpleural nodular opacities in the left upper lobe anteriorly may reflect minimal areas of bronchiolitis. Bibasilar subsegmental atelectasis is noted. 5 mm right middle lobe nodule is noted, series 7/49. A few areas of bronchial calcifications are identified peripherally within the right middle lobe adjacent to this nodular opacity. Upper Abdomen: No acute abnormality. Small calcification along the capsule of the left upper pole of the kidney. Musculoskeletal: Mild degenerative change along the thoracic spine with chronic appearing mild central compression of T4, anterior height loss of T6 and vertebral hemangioma of T9. Review of the MIP images confirms the above findings. IMPRESSION: 1. Positive for acute PE with CT evidence of right heart strain (RV/LV Ratio =  1.36) consistent with at least submassive (intermediate risk) PE. The presence of right heart strain has been associated with an increased risk of morbidity and mortality. Please activate Code PE by paging [phone]. These results were called by telephone at the time of interpretation on 11/13/2017 at 1:35 pm to Dr. Pricilla Loveless , who verbally acknowledged these results. 2. 5 mm right middle lobe nodule. No follow-up needed if patient is low-risk. Non-contrast chest CT can be considered in 12 months if patient is high-risk. This recommendation follows the consensus statement: Guidelines for Management of Incidental Pulmonary Nodules Detected on CT Images: From the Fleischner Society 2017; Radiology 2017; 284:228-243. 3. Coronary arteriosclerosis and aortic atherosclerosis. Aortic Atherosclerosis (ICD10-I70.0). Electronically Signed   By: Tollie Eth M.D.   On: 11/13/2017 13:37   Ct Cervical Spine Wo Contrast  Result Date: 11/13/2017 CLINICAL DATA:  Tripped over rug hitting left side of face, now with left orbital hematoma. EXAM: CT HEAD WITHOUT CONTRAST CT MAXILLOFACIAL WITHOUT CONTRAST CT CERVICAL SPINE WITHOUT CONTRAST TECHNIQUE: Multidetector CT imaging of the head, cervical spine, and maxillofacial structures were performed using the standard protocol without intravenous contrast. Multiplanar CT image reconstructions of the cervical spine and maxillofacial structures were also generated. COMPARISON:  None. FINDINGS: CT HEAD FINDINGS Brain: Mild, likely age-appropriate atrophy with sulcal prominence. Scattered periventricular hypodensities compatible microvascular ischemic disease. No CT evidence superimposed acute large territory infarct. No intraparenchymal or extra-axial mass or hemorrhage. Mild asymmetric prominence of the occipital horn of the left lateral ventricle without associated midline shift. Vascular: Intracranial atherosclerosis. Skull: Soft tissue swelling about the left orbit without  associated fracture or radiopaque foreign body. Other: Limited visualization the paranasal sinuses is normal. No air-fluid levels. Post bilateral cataract surgery. CT MAXILLOFACIAL FINDINGS Osseous: No displaced calvarial or facial fracture. No displaced nasal bone fracture. Normal appearance of the bilateral pterygoid plates. The  bilateral mandibular condyles are normally located. Normal appearance of the bilateral zygomatic arches. Orbits: Post bilateral cataract surgery. Otherwise, normal appearance the bilateral globes. The left-sided periorbital hematoma extends to involve the anterior aspect of the orbit, however there is no retro bulbar hematoma. Sinuses: Paranasal sinuses are normally aerated. No air-fluid levels. Minimal leftward nasal septal deviation. Small amount of debris is seen within the bilateral external auditory canals, left greater than right. Soft tissues: Large left-sided periorbital hematoma with dominant component measuring at least 6.0 x 1.3 cm (image 72, series 8). CT CERVICAL SPINE FINDINGS Alignment: C1 to the superior endplate of T2 is imaged. Normal alignment of the cervical spine. No anterolisthesis or retrolisthesis. The bilateral facets are normally aligned. The dens is normally positioned between the lateral masses of C1. Normal atlantodental and atlantoaxial articulations. Skull base and vertebrae: Cervical vertebral body heights appear preserved. Soft tissues and spinal canal: Prevertebral soft tissues appear normal. Disc levels: Mild multilevel cervical spine DDD, worse at C5-C6 with disc space height loss, endplate irregularity and small posteriorly directed disc osteophyte complex at this location. Upper chest: Limited visualization of lung apices is normal. Other: Regional soft tissues appear normal. No bulky cervical lymphadenopathy on this noncontrast examination. Minimal amount of calcified atherosclerotic plaque within the bilateral carotid bulbs, left greater than right.  Note is made of an approximately 1.1 x 0.7 cm hypoattenuating nodule within the right-sided the thyroid isthmus (image 85, series 17), of doubtful clinical concern. IMPRESSION: 1. Soft tissue swelling about the left orbit without associated displaced calvarial or facial fracture, radiopaque foreign body or acute intracranial process. 2. No displaced fracture or static subluxation of the cervical spine. 3. Mild likely age-appropriate atrophy and microvascular ischemic disease. 4. Mild multilevel cervical spine DDD, worse at C5-C6. Electronically Signed   By: Simonne Come M.D.   On: 11/13/2017 11:16   Ct Maxillofacial Wo Cm  Result Date: 11/13/2017 CLINICAL DATA:  Tripped over rug hitting left side of face, now with left orbital hematoma. EXAM: CT HEAD WITHOUT CONTRAST CT MAXILLOFACIAL WITHOUT CONTRAST CT CERVICAL SPINE WITHOUT CONTRAST TECHNIQUE: Multidetector CT imaging of the head, cervical spine, and maxillofacial structures were performed using the standard protocol without intravenous contrast. Multiplanar CT image reconstructions of the cervical spine and maxillofacial structures were also generated. COMPARISON:  None. FINDINGS: CT HEAD FINDINGS Brain: Mild, likely age-appropriate atrophy with sulcal prominence. Scattered periventricular hypodensities compatible microvascular ischemic disease. No CT evidence superimposed acute large territory infarct. No intraparenchymal or extra-axial mass or hemorrhage. Mild asymmetric prominence of the occipital horn of the left lateral ventricle without associated midline shift. Vascular: Intracranial atherosclerosis. Skull: Soft tissue swelling about the left orbit without associated fracture or radiopaque foreign body. Other: Limited visualization the paranasal sinuses is normal. No air-fluid levels. Post bilateral cataract surgery. CT MAXILLOFACIAL FINDINGS Osseous: No displaced calvarial or facial fracture. No displaced nasal bone fracture. Normal appearance of the  bilateral pterygoid plates. The bilateral mandibular condyles are normally located. Normal appearance of the bilateral zygomatic arches. Orbits: Post bilateral cataract surgery. Otherwise, normal appearance the bilateral globes. The left-sided periorbital hematoma extends to involve the anterior aspect of the orbit, however there is no retro bulbar hematoma. Sinuses: Paranasal sinuses are normally aerated. No air-fluid levels. Minimal leftward nasal septal deviation. Small amount of debris is seen within the bilateral external auditory canals, left greater than right. Soft tissues: Large left-sided periorbital hematoma with dominant component measuring at least 6.0 x 1.3 cm (image 72, series 8). CT CERVICAL SPINE  FINDINGS Alignment: C1 to the superior endplate of T2 is imaged. Normal alignment of the cervical spine. No anterolisthesis or retrolisthesis. The bilateral facets are normally aligned. The dens is normally positioned between the lateral masses of C1. Normal atlantodental and atlantoaxial articulations. Skull base and vertebrae: Cervical vertebral body heights appear preserved. Soft tissues and spinal canal: Prevertebral soft tissues appear normal. Disc levels: Mild multilevel cervical spine DDD, worse at C5-C6 with disc space height loss, endplate irregularity and small posteriorly directed disc osteophyte complex at this location. Upper chest: Limited visualization of lung apices is normal. Other: Regional soft tissues appear normal. No bulky cervical lymphadenopathy on this noncontrast examination. Minimal amount of calcified atherosclerotic plaque within the bilateral carotid bulbs, left greater than right. Note is made of an approximately 1.1 x 0.7 cm hypoattenuating nodule within the right-sided the thyroid isthmus (image 85, series 17), of doubtful clinical concern. IMPRESSION: 1. Soft tissue swelling about the left orbit without associated displaced calvarial or facial fracture, radiopaque foreign  body or acute intracranial process. 2. No displaced fracture or static subluxation of the cervical spine. 3. Mild likely age-appropriate atrophy and microvascular ischemic disease. 4. Mild multilevel cervical spine DDD, worse at C5-C6. Electronically Signed   By: Simonne Come M.D.   On: 11/13/2017 11:16    EKG: Independently reviewed.  Assessment/Plan Principal Problem:   Pulmonary embolism (HCC) Active Problems:   Elevated lipids   Essential hypertension   GERD   OVERACTIVE BLADDER   Sleep apnea   Syncope   Bilateral pulmonary embolism (HCC)   Pulmonary Embolism, Bilateral. PESI score  at 8.9% risk of death.  [place] syncope.  Rule places her in the "not low risk "; she was admitted with a presyncopal episode.  CT Angio of the chest showed bilateral PE, with right heart strain.  Troponin was 0.07.  EKG shows sinus rhythm, multiple PVCs, and stable T wave changes since 2014.  CCM was consulted, who recommended a stat echo.  We will follow the results, and if severe right heart strain is seen, then the patient will be transferred to the ICU.  White count 14.8.  UA pending.  Patient was placed on heparin drip by pharmacy. Last 2D echo in June 2017 showed EF 30 to 35%, grade 1 diastolic, moderately to severely reduced systolic. At this time   will admit to stepdown O2  Continue heparin drip; transition to oral anticoagulation in am by pharmacy  bilateral LE [place] to rule out DVT Will await 2D echo results, as mentioned above, will obtain CCM involvement a severe right heart strain is seen Continue telemetry Treat bedrest  Serial troponin   Syncope likely due to above  Telemetry Fall precautions Await 2D echo results Neurochecks  Chronic  systolic and diastolic CHF  ast 2D echo in June 2017 showed EF 30 to 35%, grade 1 diastolic, moderately to severely reduced systolic. Hold Coreg monitor I/Os and daily weights prn 02  OSA Continue CPAP nightly  Hyperlipidemia Continue home  statins   Hypertension BP   94/49   Pulse 77   In the setting of PE  Hold  home anti-hypertensive medications    DVT prophylaxis:  Heparin per pharmacy  Code Status:   Limited  Family Communication:  Discussed with patient Disposition Plan: Expect patient to be discharged to home after condition improves Consults called:    CCM by EDP Admission status: SDU   Marlowe Kays, PA-C [facility]   Amion text  [id number]  11/13/2017, 3:36 PM

## 2017-11-13 NOTE — Progress Notes (Signed)
Cardiology asked to get a stat echo for large bilateral PE, to assess R heart strain. Order entered and Echo staff contacted.   Theodore Demark, PA-C 11/13/2017 2:39 PM Beeper [phone]

## 2017-11-13 NOTE — Telephone Encounter (Signed)
Note not needed 

## 2017-11-13 NOTE — ED Triage Notes (Signed)
Pt was standing, felt a "tingling sensation in her head", pt fell to the ground and hit her head. Pt has small lac to left cheek, significant bruising to left eye/cheek. Bleeding controlled. Pt has syncopal episode with EMS when they tried to stand her up.BP 98/68. Sats 85 on room air just after pt fainted. EMS placed [place] and pt's sats improved to 96%. CBG 184, NS bolus. Pt is alert and oriented per EMS. Pt is from [facility]. Pt arrives with a MOST form

## 2017-11-13 NOTE — Progress Notes (Signed)
  Echocardiogram 2D Echocardiogram has been performed.  [name] 11/13/2017, 3:45 PM

## 2017-11-13 NOTE — Progress Notes (Signed)
ANTICOAGULATION CONSULT NOTE - Initial Consult  Pharmacy Consult for heparin Indication: pulmonary embolus  Allergies  Allergen Reactions  . Codeine Nausea And Vomiting and Other (See Comments)    REACTION: dizzy- but can tolerate hydrocodone.   . Levofloxacin Other (See Comments)    REACTION: itching  . Neospect [Dyphylline-Ephed-Pb-Gg Or] Other (See Comments)    unknown  . Spironolactone     Hyperkalemia   . Tramadol     Didn't help with pain  . Neosporin [Neomycin-Bacitracin Zn-Polymyx] Rash    Patient Measurements: Height:  (157.5 cm) Weight: 155 lb (70.3 kg) IBW/kg (Calculated) : 50.1 Heparin Dosing Weight: 65kg  Vital Signs: BP: 93/70 (05/24 1315) Pulse Rate: 80 (05/24 1315)  Labs: Recent Labs    11/13/17 1036  HGB 13.2  HCT 42.7  PLT 183  CREATININE 1.34*  TROPONINI 0.07*    Estimated Creatinine Clearance: 24.6 mL/min (A) (by C-G formula based on SCr of 1.34 mg/dL (H)).   Medical History: Past Medical History:  Diagnosis Date  . AK (actinic keratosis)   . Allergy   . Chronic dermatitis    face, allergic to many products  . GERD (gastroesophageal reflux disease)   . Hyperlipidemia   . Hypertension   . Kidney stones   . Overactive bladder   . Sleep apnea   . Syncope     Medications:  Infusions:  . heparin      Assessment: 35 yof presented s/p fall found to have a PE with right heart strain. To start IV heparin. Baseline CBC is WNL and she is not on anticoagulation PTA.   Goal of Therapy:  Heparin level 0.3-0.7 units/ml Monitor platelets by anticoagulation protocol: Yes   Plan:  Heparin bolus 3500 units IV x 1 Heparin gtt 1100 units/hr Check an 8 hr heparin level Daily heparin level and CBC  [name], Drake Leach 11/13/2017,1:46 PM

## 2017-11-13 NOTE — ED Notes (Signed)
Placed dressing over pt's laceration on left cheek.

## 2017-11-13 NOTE — ED Provider Notes (Signed)
[facility] EMERGENCY DEPARTMENT Provider Note   CSN: [id number] Arrival date & time: 11/13/17  1007     History   Chief Complaint Chief Complaint  Patient presents with  . Fall    HPI [name] is a 84 y.o. female.  HPI  84 year old female presents with fall.  She was at an assisted living facility and felt lightheaded while she was standing up and fell down.  She hit her head on something.  She has a large left-sided ecchymosis with wound.  She has a mild headache in this area.  She states she is felt short of breath since she fell.  She denies any chest pain.  She has chronic left knee/leg swelling that is unchanged for months.  She denies any focal weakness. She passed out when EMS tried to stand her up.  Past Medical History:  Diagnosis Date  . AK (actinic keratosis)   . Allergy   . Chronic dermatitis    face, allergic to many products  . GERD (gastroesophageal reflux disease)   . Hyperlipidemia   . Hypertension   . Kidney stones   . Overactive bladder   . Sleep apnea   . Syncope     Patient Active Problem List   Diagnosis Date Noted  . Pulmonary embolism (HCC) 11/13/2017  . Bilateral pulmonary embolism (HCC) 11/13/2017  . Urinary tract infection with hematuria 06/19/2017  . Mammogram declined 04/12/2014  . Radicular low back pain 11/09/2013  . Anemia, iron deficiency 05/26/2013  . Knee pain 02/23/2013  . Breast mass 12/02/2012  . Skin lesion 02/06/2012  . Chest wall pain 02/06/2012  . Advance directive discussed with patient 05/18/2011  . Osteoporosis screening 05/18/2011  . Syncope 11/12/2010  . COUGH 04/18/2010  . CHOLELITHIASIS, ASYMPTOMATIC 07/31/2008  . EDEMA 07/31/2008  . Elevated lipids 11/24/2007  . Essential hypertension 11/10/2007  . ALLERGIC RHINITIS 11/10/2007  . GERD 11/10/2007  . HIATAL HERNIA 11/10/2007  . OVERACTIVE BLADDER 11/10/2007  . OSTEOARTHRITIS 11/10/2007  . Sleep apnea 11/10/2007  . DYSPNEA ON EXERTION  11/10/2007  . NEPHROLITHIASIS, HX OF 11/10/2007    Past Surgical History:  Procedure Laterality Date  . broken nose    . COLONOSCOPY  2004     OB History   None      Home Medications    Prior to Admission medications   Medication Sig Start Date End Date Taking? Authorizing Provider  acetaminophen (TYLENOL) 500 MG tablet Take 1,000 mg by mouth 2 (two) times daily.     [provider]  aspirin EC 81 MG tablet Take 1 tablet (81 mg total) by mouth daily. Patient not taking: Reported on 08/05/2017 06/24/17   Duke Salvia, MD  carvedilol (COREG) 3.125 MG tablet Take 1 tablet (3.125 mg total) by mouth 2 (two) times daily with a meal. 11/02/17   Pricilla Riffle, MD  cholecalciferol (VITAMIN D) 1000 UNITS tablet Take 500 Units by mouth daily.     [provider]  diclofenac sodium (VOLTAREN) 1 % GEL Apply 2 g 4 (four) times daily topically. On the left knee for pain 05/01/17   Joaquim Nam, MD  ferrous sulfate 325 (65 FE) MG tablet Take 1 tablet (325 mg total) by mouth daily with breakfast. 09/17/17   Pricilla Riffle, MD  pravastatin (PRAVACHOL) 40 MG tablet Take 1 tablet (40 mg total) by mouth every evening. 10/15/17   Pricilla Riffle, MD  sacubitril-valsartan (ENTRESTO) 24-26 MG Take 1  tablet by mouth 2 (two) times daily. 08/04/17   Duke Salvia, MD  sacubitril-valsartan (ENTRESTO) 24-26 MG Take 1 tablet by mouth 2 (two) times daily. 08/05/17   Pricilla Riffle, MD  spironolactone (ALDACTONE) 25 MG tablet Take 0.5 tablets (12.5 mg total) by mouth daily. 10/15/17   Pricilla Riffle, MD  torsemide [facility]) 20 MG tablet Take 40 mg in the AM by mouth four times weekly, Sunday, Monday , Tuesday and Wednesday    [provider]    Family History Family History  Problem Relation Age of Onset  . Cancer Mother        stomach  . Coronary artery disease Father   . Colon cancer Neg Hx     Social History Social History   Tobacco Use  . Smoking status: Former Smoker     Types: Cigarettes  . Smokeless tobacco: Never Used  . Tobacco comment: smoked during college  Substance Use Topics  . Alcohol use: No  . Drug use: No     Allergies   Codeine; Levofloxacin; Neospect [dyphylline-ephed-pb-gg or]; Spironolactone; Tramadol; and Neosporin [neomycin-bacitracin zn-polymyx]   Review of Systems Review of Systems  Constitutional: Negative for fever.  HENT: Positive for facial swelling.   Respiratory: Positive for shortness of breath.   Cardiovascular: Negative for chest pain.  Gastrointestinal: Positive for nausea. Negative for vomiting.  Neurological: Positive for syncope. Negative for headaches.  All other systems reviewed and are negative.    Physical Exam Updated Vital Signs BP (!) 94/49   Pulse 77   Temp 97.8 F (36.6 C) (Oral)   Resp 17   Ht  (1.575 m)   Wt 70.3 kg (155 lb)   SpO2 97%   BMI 28.35 kg/m   Physical Exam  Constitutional: She is oriented to person, place, and time. She appears well-developed and well-nourished.  HENT:  Head: Normocephalic.    Right Ear: External ear normal.  Left Ear: External ear normal.  Nose: Nose normal.  Eyes: Pupils are equal, round, and reactive to light. EOM are normal. Right eye exhibits no discharge. Left eye exhibits no discharge.  No blurry vision in L eye. Normal EOM. No obvious orbital trauma  Cardiovascular: Normal rate, regular rhythm and normal heart sounds.  Pulmonary/Chest: Effort normal and breath sounds normal. She has no wheezes.  Abdominal: Soft. There is no tenderness.  Musculoskeletal:  LLE swollen compared to right, mostly inferior to knee. No tenderness or decreased ROM  Neurological: She is alert and oriented to person, place, and time.  Skin: Skin is warm and dry.  Nursing note and vitals reviewed.    ED Treatments / Results  Labs (all labs ordered are listed, but only abnormal results are displayed) Labs Reviewed  CBC WITH DIFFERENTIAL/PLATELET - Abnormal;  Notable for the following components:      Result Value   WBC 14.8 (*)    RDW 18.0 (*)    Neutro Abs 11.5 (*)    All other components within normal limits  BASIC METABOLIC PANEL - Abnormal; Notable for the following components:   Glucose, Bld 149 (*)    BUN 26 (*)    Creatinine, Ser 1.34 (*)    GFR calc non Af Amer 33 (*)    GFR calc Af Amer 39 (*)    All other components within normal limits  BRAIN NATRIURETIC PEPTIDE - Abnormal; Notable for the following components:   B Natriuretic Peptide 180.4 (*)    All other components  within normal limits  TROPONIN I - Abnormal; Notable for the following components:   Troponin I 0.07 (*)    All other components within normal limits  URINALYSIS, ROUTINE W REFLEX MICROSCOPIC  TROPONIN I  TROPONIN I  TROPONIN I    EKG EKG Interpretation  Date/Time:  Friday Nov 13 2017 10:27:52 EDT Ventricular Rate:  84 PR Interval:    QRS Duration: 103 QT Interval:  379 QTC Calculation: 448 R Axis:   -48 Text Interpretation:  Sinus rhythm Multiple ventricular premature complexes Inferior infarct, old Anterior infarct, old T wave changes stable compared to 2014 Confirmed by Pricilla Loveless [id number] on 11/13/2017 10:49:54 AM   Radiology Dg Chest 2 View  Result Date: 11/13/2017 CLINICAL DATA:  Hypoxia following recent fall EXAM: CHEST - 2 VIEW COMPARISON:  05/18/2013 FINDINGS: Cardiac shadow is stable. Large hiatal hernia is again seen. Stable aortic calcifications are noted. No focal infiltrate or sizable effusion is seen. No acute bony abnormality is noted. Midthoracic compression deformity is noted and stable. IMPRESSION: No acute abnormality noted. Chronic changes as described. Electronically Signed   By: Alcide Clever M.D.   On: 11/13/2017 11:25   Ct Head Wo Contrast  Result Date: 11/13/2017 CLINICAL DATA:  Tripped over rug hitting left side of face, now with left orbital hematoma. EXAM: CT HEAD WITHOUT CONTRAST CT MAXILLOFACIAL WITHOUT CONTRAST CT  CERVICAL SPINE WITHOUT CONTRAST TECHNIQUE: Multidetector CT imaging of the head, cervical spine, and maxillofacial structures were performed using the standard protocol without intravenous contrast. Multiplanar CT image reconstructions of the cervical spine and maxillofacial structures were also generated. COMPARISON:  None. FINDINGS: CT HEAD FINDINGS Brain: Mild, likely age-appropriate atrophy with sulcal prominence. Scattered periventricular hypodensities compatible microvascular ischemic disease. No CT evidence superimposed acute large territory infarct. No intraparenchymal or extra-axial mass or hemorrhage. Mild asymmetric prominence of the occipital horn of the left lateral ventricle without associated midline shift. Vascular: Intracranial atherosclerosis. Skull: Soft tissue swelling about the left orbit without associated fracture or radiopaque foreign body. Other: Limited visualization the paranasal sinuses is normal. No air-fluid levels. Post bilateral cataract surgery. CT MAXILLOFACIAL FINDINGS Osseous: No displaced calvarial or facial fracture. No displaced nasal bone fracture. Normal appearance of the bilateral pterygoid plates. The bilateral mandibular condyles are normally located. Normal appearance of the bilateral zygomatic arches. Orbits: Post bilateral cataract surgery. Otherwise, normal appearance the bilateral globes. The left-sided periorbital hematoma extends to involve the anterior aspect of the orbit, however there is no retro bulbar hematoma. Sinuses: Paranasal sinuses are normally aerated. No air-fluid levels. Minimal leftward nasal septal deviation. Small amount of debris is seen within the bilateral external auditory canals, left greater than right. Soft tissues: Large left-sided periorbital hematoma with dominant component measuring at least 6.0 x 1.3 cm (image 72, series 8). CT CERVICAL SPINE FINDINGS Alignment: C1 to the superior endplate of T2 is imaged. Normal alignment of the  cervical spine. No anterolisthesis or retrolisthesis. The bilateral facets are normally aligned. The dens is normally positioned between the lateral masses of C1. Normal atlantodental and atlantoaxial articulations. Skull base and vertebrae: Cervical vertebral body heights appear preserved. Soft tissues and spinal canal: Prevertebral soft tissues appear normal. Disc levels: Mild multilevel cervical spine DDD, worse at C5-C6 with disc space height loss, endplate irregularity and small posteriorly directed disc osteophyte complex at this location. Upper chest: Limited visualization of lung apices is normal. Other: Regional soft tissues appear normal. No bulky cervical lymphadenopathy on this noncontrast examination. Minimal amount of calcified atherosclerotic  plaque within the bilateral carotid bulbs, left greater than right. Note is made of an approximately 1.1 x 0.7 cm hypoattenuating nodule within the right-sided the thyroid isthmus (image 85, series 17), of doubtful clinical concern. IMPRESSION: 1. Soft tissue swelling about the left orbit without associated displaced calvarial or facial fracture, radiopaque foreign body or acute intracranial process. 2. No displaced fracture or static subluxation of the cervical spine. 3. Mild likely age-appropriate atrophy and microvascular ischemic disease. 4. Mild multilevel cervical spine DDD, worse at C5-C6. Electronically Signed   By: Simonne Come M.D.   On: 11/13/2017 11:16   Ct Angio Chest Pe W/cm &/or Wo Cm  Result Date: 11/13/2017 CLINICAL DATA:  Patient fell this morning with elevated troponin. EXAM: CT ANGIOGRAPHY CHEST WITH CONTRAST TECHNIQUE: Multidetector CT imaging of the chest was performed using the standard protocol during bolus administration of intravenous contrast. Multiplanar CT image reconstructions and MIPs were obtained to evaluate the vascular anatomy. CONTRAST:  80mL ISOVUE-370 IOPAMIDOL (ISOVUE-370) INJECTION 76% COMPARISON:  07/20/2008 FINDINGS:  Cardiovascular: Satisfactory opacification of pulmonary arteries. Bilateral central pulmonary emboli to all lobes starting at the branch point of the right and left main pulmonary arteries. Right heart strain with RV/LV ratio 1.38. Conventional branch pattern of the great vessels with atherosclerosis. Moderate aortic atherosclerosis without aneurysm. Left main and three-vessel coronary arteriosclerosis. Mediastinum/Nodes: Moderate-sized hiatal hernia. No lymphadenopathy. Trachea and esophagus are unremarkable. No thyromegaly or mass. Lungs/Pleura: No acute pulmonary consolidation, effusion or pneumothorax. Tiny subpleural nodular opacities in the left upper lobe anteriorly may reflect minimal areas of bronchiolitis. Bibasilar subsegmental atelectasis is noted. 5 mm right middle lobe nodule is noted, series 7/49. A few areas of bronchial calcifications are identified peripherally within the right middle lobe adjacent to this nodular opacity. Upper Abdomen: No acute abnormality. Small calcification along the capsule of the left upper pole of the kidney. Musculoskeletal: Mild degenerative change along the thoracic spine with chronic appearing mild central compression of T4, anterior height loss of T6 and vertebral hemangioma of T9. Review of the MIP images confirms the above findings. IMPRESSION: 1. Positive for acute PE with CT evidence of right heart strain (RV/LV Ratio = 1.36) consistent with at least submassive (intermediate risk) PE. The presence of right heart strain has been associated with an increased risk of morbidity and mortality. Please activate Code PE by paging [phone]. These results were called by telephone at the time of interpretation on 11/13/2017 at 1:35 pm to Dr. Pricilla Loveless , who verbally acknowledged these results. 2. 5 mm right middle lobe nodule. No follow-up needed if patient is low-risk. Non-contrast chest CT can be considered in 12 months if patient is high-risk. This recommendation  follows the consensus statement: Guidelines for Management of Incidental Pulmonary Nodules Detected on CT Images: From the Fleischner Society 2017; Radiology 2017; 284:228-243. 3. Coronary arteriosclerosis and aortic atherosclerosis. Aortic Atherosclerosis (ICD10-I70.0). Electronically Signed   By: Tollie Eth M.D.   On: 11/13/2017 13:37   Ct Cervical Spine Wo Contrast  Result Date: 11/13/2017 CLINICAL DATA:  Tripped over rug hitting left side of face, now with left orbital hematoma. EXAM: CT HEAD WITHOUT CONTRAST CT MAXILLOFACIAL WITHOUT CONTRAST CT CERVICAL SPINE WITHOUT CONTRAST TECHNIQUE: Multidetector CT imaging of the head, cervical spine, and maxillofacial structures were performed using the standard protocol without intravenous contrast. Multiplanar CT image reconstructions of the cervical spine and maxillofacial structures were also generated. COMPARISON:  None. FINDINGS: CT HEAD FINDINGS Brain: Mild, likely age-appropriate atrophy with sulcal prominence. Scattered periventricular  hypodensities compatible microvascular ischemic disease. No CT evidence superimposed acute large territory infarct. No intraparenchymal or extra-axial mass or hemorrhage. Mild asymmetric prominence of the occipital horn of the left lateral ventricle without associated midline shift. Vascular: Intracranial atherosclerosis. Skull: Soft tissue swelling about the left orbit without associated fracture or radiopaque foreign body. Other: Limited visualization the paranasal sinuses is normal. No air-fluid levels. Post bilateral cataract surgery. CT MAXILLOFACIAL FINDINGS Osseous: No displaced calvarial or facial fracture. No displaced nasal bone fracture. Normal appearance of the bilateral pterygoid plates. The bilateral mandibular condyles are normally located. Normal appearance of the bilateral zygomatic arches. Orbits: Post bilateral cataract surgery. Otherwise, normal appearance the bilateral globes. The left-sided periorbital  hematoma extends to involve the anterior aspect of the orbit, however there is no retro bulbar hematoma. Sinuses: Paranasal sinuses are normally aerated. No air-fluid levels. Minimal leftward nasal septal deviation. Small amount of debris is seen within the bilateral external auditory canals, left greater than right. Soft tissues: Large left-sided periorbital hematoma with dominant component measuring at least 6.0 x 1.3 cm (image 72, series 8). CT CERVICAL SPINE FINDINGS Alignment: C1 to the superior endplate of T2 is imaged. Normal alignment of the cervical spine. No anterolisthesis or retrolisthesis. The bilateral facets are normally aligned. The dens is normally positioned between the lateral masses of C1. Normal atlantodental and atlantoaxial articulations. Skull base and vertebrae: Cervical vertebral body heights appear preserved. Soft tissues and spinal canal: Prevertebral soft tissues appear normal. Disc levels: Mild multilevel cervical spine DDD, worse at C5-C6 with disc space height loss, endplate irregularity and small posteriorly directed disc osteophyte complex at this location. Upper chest: Limited visualization of lung apices is normal. Other: Regional soft tissues appear normal. No bulky cervical lymphadenopathy on this noncontrast examination. Minimal amount of calcified atherosclerotic plaque within the bilateral carotid bulbs, left greater than right. Note is made of an approximately 1.1 x 0.7 cm hypoattenuating nodule within the right-sided the thyroid isthmus (image 85, series 17), of doubtful clinical concern. IMPRESSION: 1. Soft tissue swelling about the left orbit without associated displaced calvarial or facial fracture, radiopaque foreign body or acute intracranial process. 2. No displaced fracture or static subluxation of the cervical spine. 3. Mild likely age-appropriate atrophy and microvascular ischemic disease. 4. Mild multilevel cervical spine DDD, worse at C5-C6. Electronically Signed    By: Simonne Come M.D.   On: 11/13/2017 11:16   Ct Maxillofacial Wo Cm  Result Date: 11/13/2017 CLINICAL DATA:  Tripped over rug hitting left side of face, now with left orbital hematoma. EXAM: CT HEAD WITHOUT CONTRAST CT MAXILLOFACIAL WITHOUT CONTRAST CT CERVICAL SPINE WITHOUT CONTRAST TECHNIQUE: Multidetector CT imaging of the head, cervical spine, and maxillofacial structures were performed using the standard protocol without intravenous contrast. Multiplanar CT image reconstructions of the cervical spine and maxillofacial structures were also generated. COMPARISON:  None. FINDINGS: CT HEAD FINDINGS Brain: Mild, likely age-appropriate atrophy with sulcal prominence. Scattered periventricular hypodensities compatible microvascular ischemic disease. No CT evidence superimposed acute large territory infarct. No intraparenchymal or extra-axial mass or hemorrhage. Mild asymmetric prominence of the occipital horn of the left lateral ventricle without associated midline shift. Vascular: Intracranial atherosclerosis. Skull: Soft tissue swelling about the left orbit without associated fracture or radiopaque foreign body. Other: Limited visualization the paranasal sinuses is normal. No air-fluid levels. Post bilateral cataract surgery. CT MAXILLOFACIAL FINDINGS Osseous: No displaced calvarial or facial fracture. No displaced nasal bone fracture. Normal appearance of the bilateral pterygoid plates. The bilateral mandibular condyles are normally  located. Normal appearance of the bilateral zygomatic arches. Orbits: Post bilateral cataract surgery. Otherwise, normal appearance the bilateral globes. The left-sided periorbital hematoma extends to involve the anterior aspect of the orbit, however there is no retro bulbar hematoma. Sinuses: Paranasal sinuses are normally aerated. No air-fluid levels. Minimal leftward nasal septal deviation. Small amount of debris is seen within the bilateral external auditory canals, left  greater than right. Soft tissues: Large left-sided periorbital hematoma with dominant component measuring at least 6.0 x 1.3 cm (image 72, series 8). CT CERVICAL SPINE FINDINGS Alignment: C1 to the superior endplate of T2 is imaged. Normal alignment of the cervical spine. No anterolisthesis or retrolisthesis. The bilateral facets are normally aligned. The dens is normally positioned between the lateral masses of C1. Normal atlantodental and atlantoaxial articulations. Skull base and vertebrae: Cervical vertebral body heights appear preserved. Soft tissues and spinal canal: Prevertebral soft tissues appear normal. Disc levels: Mild multilevel cervical spine DDD, worse at C5-C6 with disc space height loss, endplate irregularity and small posteriorly directed disc osteophyte complex at this location. Upper chest: Limited visualization of lung apices is normal. Other: Regional soft tissues appear normal. No bulky cervical lymphadenopathy on this noncontrast examination. Minimal amount of calcified atherosclerotic plaque within the bilateral carotid bulbs, left greater than right. Note is made of an approximately 1.1 x 0.7 cm hypoattenuating nodule within the right-sided the thyroid isthmus (image 85, series 17), of doubtful clinical concern. IMPRESSION: 1. Soft tissue swelling about the left orbit without associated displaced calvarial or facial fracture, radiopaque foreign body or acute intracranial process. 2. No displaced fracture or static subluxation of the cervical spine. 3. Mild likely age-appropriate atrophy and microvascular ischemic disease. 4. Mild multilevel cervical spine DDD, worse at C5-C6. Electronically Signed   By: Simonne Come M.D.   On: 11/13/2017 11:16    Procedures .Critical Care Performed by: Pricilla Loveless, MD Authorized by: Pricilla Loveless, MD   Critical care provider statement:    Critical care time (minutes):  40   Critical care time was exclusive of:  Separately billable procedures  and treating other patients   Critical care was necessary to treat or prevent imminent or life-threatening deterioration of the following conditions:  Circulatory failure, respiratory failure and shock   Critical care was time spent personally by me on the following activities:  Development of treatment plan with patient or surrogate, discussions with consultants, evaluation of patient's response to treatment, examination of patient, obtaining history from patient or surrogate, ordering and performing treatments and interventions, ordering and review of laboratory studies, ordering and review of radiographic studies, pulse oximetry, re-evaluation of patient's condition and review of old charts   (including critical care time)  Medications Ordered in ED Medications  heparin ADULT infusion 100 units/mL (25000 units/211mL sodium chloride 0.45%) (1,100 Units/hr Intravenous Transfusing/Transfer 11/13/17 1533)  carvedilol (COREG) tablet 3.125 mg (has no administration in time range)  pravastatin (PRAVACHOL) tablet 40 mg (has no administration in time range)  sacubitril-valsartan (ENTRESTO) 24-26 mg per tablet (has no administration in time range)  acetaminophen (TYLENOL) tablet 650 mg (has no administration in time range)    Or  acetaminophen (TYLENOL) suppository 650 mg (has no administration in time range)  senna-docusate (Senokot-S) tablet 1 tablet (has no administration in time range)  bisacodyl (DULCOLAX) suppository 10 mg (has no administration in time range)  ondansetron (ZOFRAN) tablet 4 mg (has no administration in time range)    Or  ondansetron (ZOFRAN) injection 4 mg (has no administration in  time range)  morphine 4 MG/ML injection 1 mg (has no administration in time range)  HYDROcodone-acetaminophen (NORCO/VICODIN) 5-325 MG per tablet 1-2 tablet (has no administration in time range)  perflutren lipid microspheres (DEFINITY) IV suspension (has no administration in time range)  ondansetron  (ZOFRAN) injection 4 mg (4 mg Intravenous Given 11/13/17 1019)  acetaminophen (TYLENOL) tablet 650 mg (650 mg Oral Given 11/13/17 1029)  Tdap (BOOSTRIX) injection 0.5 mL (0.5 mLs Intramuscular Given 11/13/17 1029)  sodium chloride 0.9 % bolus 500 mL (0 mLs Intravenous Stopped 11/13/17 1345)  lidocaine-EPINEPHrine (XYLOCAINE W/EPI) 2 %-1:200000 (PF) injection 10 mL (10 mLs Intradermal Given 11/13/17 1240)  iopamidol (ISOVUE-370) 76 % injection (80 mLs  Contrast Given 11/13/17 1306)  heparin bolus via infusion 3,500 Units (3,500 Units Intravenous Bolus from Bag 11/13/17 1351)     Initial Impression / Assessment and Plan / ED Course  I have reviewed the triage vital signs and the nursing notes.  Pertinent labs & imaging results that were available during my care of the patient were reviewed by me and considered in my medical decision making (see chart for details).     Patient presents after near syncope and fall and head injury.  No obvious acute intracranial injury such as bleeding or skull fracture.  She has an impressive hematoma with small laceration but no fracture of her face.  She is hypoxic on arrival and work-up for PE obtained.  Clear lungs with no findings on chest x-ray.  O2 normal on supplemental nasal cannula oxygen.  She is found to have bilateral proximal PEs with right heart strain.  I discussed with Dr. Wallace Cullens who recommends stat echocardiogram and would consider possible direct thrombolysis if the right heart strain is significant.  Otherwise advises hospitalist to admit.  She is placed on IV heparin.  She is mentating well and while she has soft blood pressures in the 90s she does not appear in distress.  Hospitalist to admit.  Echo ordered by cards.  Final Clinical Impressions(s) / ED Diagnoses   Final diagnoses:  Acute pulmonary embolism with acute cor pulmonale, unspecified pulmonary embolism type [facility])    ED Discharge Orders    None       Pricilla Loveless, MD 11/13/17  [phone]

## 2017-11-13 NOTE — Consult Note (Addendum)
Name: [name] MRN: [id number] DOB: 1923-07-16    ADMISSION DATE:  11/13/2017 CONSULTATION DATE:  11/13/2017  REFERRING MD :  Dr. Arlean Hopping  CHIEF COMPLAINT:  PE  HISTORY OF PRESENT ILLNESS:   84 year old female with PMH of Sleep Apnea, Systolic HF, HLD, GERD, resides in ALF   Presents to ED on 5/24 s/p Fall. Patient reports she was standing when she became short of breath and lightheaded. Hit left side of face during fall. Lives a fairly inactive lifestyle. CT Head and C-Spine negative. CTA with acute PE RV/LV 1.38. Troponin 0.07. PCCM asked to consult.   SIGNIFICANT EVENTS  5/24 > Presents to ED   STUDIES:  CT Head/C-Spine 5/24 > 1. Soft tissue swelling about the left orbit without associated displaced calvarial or facial fracture, radiopaque foreign body or acute intracranial process. 2. No displaced fracture or static subluxation of the cervical spine. 3. Mild likely age-appropriate atrophy and microvascular ischemic disease. 4. Mild multilevel cervical spine DDD, worse at C5-C6. CXR 5/24 > Cardiac shadow is stable. Large hiatal hernia is again seen. Stable aortic calcifications are noted. No focal infiltrate or sizable effusion is seen. No acute bony abnormality is noted. Midthoracic compression deformity is noted and stable. CTA Chest 5/24 > 1. Positive for acute PE with CT evidence of right heart strain (RV/LV Ratio = 1.36) consistent with at least submassive (intermediate risk) PE. The presence of right heart strain has been associated with an increased risk of morbidity and mortality. Please activate Code PE by paging [phone]. These results were called by telephone at the time of interpretation on 11/13/2017 at 1:35 pm to Dr. Pricilla Loveless , who verbally acknowledged these results. 2. 5 mm right middle lobe nodule. No follow-up needed if patient is low-risk. Non-contrast chest CT can be considered in 12 months if patient is high-risk. This recommendation  follows the consensus statement: Guidelines for Management of Incidental Pulmonary Nodules Detected on CT Images: From the Fleischner Society 2017; Radiology 2017; 284:228-243. 3. Coronary arteriosclerosis and aortic atherosclerosis. ECHO 5/24 > - Left ventricle: The cavity size was normal. Wall thickness was   increased in a pattern of mild LVH. There was moderate focal   basal hypertrophy of the septum. Apical and inferior wall   akinesis, anterior hypokinesis, incoordinate septal motion.   Systolic function was moderately to severely reduced. The   estimated ejection fraction was in the range of 30% to 35%. The   study is not technically sufficient to allow evaluation of LV   diastolic function. - Ventricular septum: Septal motion showed abnormal function and   dyssynergy. - Mitral valve: Calcified annulus. Mildly thickened leaflets .   There was trivial regurgitation. - Left atrium: The atrium was normal in size. - Right ventricle: The cavity size was mildly dilated. Moderate RV   free wall hypokinesis with hyperdynamic apical function   (McConnell&'s sign). - Right atrium: The atrium was mildly dilated. - Tricuspid valve: There was moderate regurgitation. - Pulmonary arteries: PA peak pressure: 54 mm Hg (S). - Inferior vena cava: The vessel was dilated. The respirophasic   diameter changes were blunted (< 50%), consistent with elevated   central venous pressure.   PAST MEDICAL HISTORY :   has a past medical history of AK (actinic keratosis), Allergy, Chronic dermatitis, GERD (gastroesophageal reflux disease), Hyperlipidemia, Hypertension, Kidney stones, Overactive bladder, Sleep apnea, and Syncope.  has a past surgical history that includes broken nose and Colonoscopy (2004). Prior to Admission medications  Medication Sig Start Date End Date Taking? Authorizing Provider  acetaminophen (TYLENOL) 500 MG tablet Take 1,000 mg by mouth 2 (two) times daily.    Yes [provider]  carvedilol (COREG) 3.125 MG tablet Take 1 tablet (3.125 mg total) by mouth 2 (two) times daily with a meal. 11/02/17  Yes Pricilla Riffle, MD  cholecalciferol (VITAMIN D) 1000 UNITS tablet Take 1,000 Units by mouth daily.    Yes [provider]  diclofenac sodium (VOLTAREN) 1 % GEL Apply 2 g 4 (four) times daily topically. On the left knee for pain Patient taking differently: Apply 2 g topically daily as needed. On the left knee for pain 05/01/17  Yes Joaquim Nam, MD  ferrous sulfate 325 (65 FE) MG tablet Take 1 tablet (325 mg total) by mouth daily with breakfast. 09/17/17  Yes Pricilla Riffle, MD  pravastatin (PRAVACHOL) 40 MG tablet Take 1 tablet (40 mg total) by mouth every evening. 10/15/17  Yes Pricilla Riffle, MD  sacubitril-valsartan (ENTRESTO) 49-51 MG Take 1 tablet by mouth 2 (two) times daily.   Yes [provider]  torsemide (DEMADEX) 20 MG tablet Take 40 mg in the AM by mouth four times weekly, Sunday, Monday , Tuesday and Wednesday   Yes [provider]  spironolactone (ALDACTONE) 25 MG tablet Take 0.5 tablets (12.5 mg total) by mouth daily. 10/15/17   Pricilla Riffle, MD   Allergies  Allergen Reactions  . Codeine Nausea And Vomiting and Other (See Comments)    REACTION: dizzy- but can tolerate hydrocodone.   . Levofloxacin Other (See Comments)    REACTION: itching  . Neospect [Dyphylline-Ephed-Pb-Gg Or] Other (See Comments)    unknown  . Spironolactone     Hyperkalemia   . Tramadol     Didn't help with pain  . Neosporin [Neomycin-Bacitracin Zn-Polymyx] Rash    FAMILY HISTORY:  family history includes Cancer in her mother; Coronary artery disease in her father. SOCIAL HISTORY:  reports that she has quit smoking. Her smoking use included cigarettes. She has never used smokeless tobacco. She reports that she does not drink alcohol or use drugs.  REVIEW OF SYSTEMS:   All negative; except for those that are bolded, which indicate  positives.  Constitutional: weight loss, weight gain, night sweats, fevers, chills, fatigue, weakness.  HEENT: headaches, sore throat, sneezing, nasal congestion, post nasal drip, difficulty swallowing, tooth/dental problems, visual complaints, visual changes, ear aches. Neuro: difficulty with speech, weakness, numbness, ataxia. CV:  chest pain, orthopnea, PND, swelling in lower extremities, dizziness, palpitations, syncope.  Resp: cough, hemoptysis, dyspnea, wheezing. GI: heartburn, indigestion, abdominal pain, nausea, vomiting, diarrhea, constipation, change in bowel habits, loss of appetite, hematemesis, melena, hematochezia.  GU: dysuria, change in color of urine, urgency or frequency, flank pain, hematuria. MSK: joint pain or swelling, decreased range of motion. Psych: change in mood or affect, depression, anxiety, suicidal ideations, homicidal ideations. Skin: rash, itching, bruising.   SUBJECTIVE:  Sitting in bed, with chronic left knee and lower back pain   VITAL SIGNS: Temp:  [97.8 F (36.6 C)-98.1 F (36.7 C)] 98.1 F (36.7 C) (05/24 2026) Pulse Rate:  [77-87] 86 (05/24 2026) Resp:  [11-29] 20 (05/24 2026) BP: (93-145)/(49-102) 95/60 (05/24 2026) SpO2:  [85 %-100 %] 97 % (05/24 2026) Weight:  [70.3 kg (155 lb)] 70.3 kg (155 lb) (05/24 1021)  PHYSICAL EXAMINATION: General:  Elderly female, no distress  Neuro:  Alert, oriented, follows commands  HEENT:  Bruising noted to left  face/head  Cardiovascular:  RRR, no MRG Lungs:  Clear breath sounds, non-labored  Abdomen:  Non-distended, active bowel sounds  Musculoskeletal:  -edema Skin:  Left Knee swelling (patient reports chronic)  Recent Labs  Lab 11/13/17 1036  NA 140  K 4.5  CL 107  CO2 23  BUN 26*  CREATININE 1.34*  GLUCOSE 149*   Recent Labs  Lab 11/13/17 1036 11/13/17 2057  HGB 13.2 12.9  HCT 42.7 42.3  WBC 14.8* 9.8  PLT 183 163   Dg Chest 2 View  Result Date: 11/13/2017 CLINICAL DATA:  Hypoxia  following recent fall EXAM: CHEST - 2 VIEW COMPARISON:  05/18/2013 FINDINGS: Cardiac shadow is stable. Large hiatal hernia is again seen. Stable aortic calcifications are noted. No focal infiltrate or sizable effusion is seen. No acute bony abnormality is noted. Midthoracic compression deformity is noted and stable. IMPRESSION: No acute abnormality noted. Chronic changes as described. Electronically Signed   By: Alcide Clever M.D.   On: 11/13/2017 11:25   Ct Head Wo Contrast  Result Date: 11/13/2017 CLINICAL DATA:  Tripped over rug hitting left side of face, now with left orbital hematoma. EXAM: CT HEAD WITHOUT CONTRAST CT MAXILLOFACIAL WITHOUT CONTRAST CT CERVICAL SPINE WITHOUT CONTRAST TECHNIQUE: Multidetector CT imaging of the head, cervical spine, and maxillofacial structures were performed using the standard protocol without intravenous contrast. Multiplanar CT image reconstructions of the cervical spine and maxillofacial structures were also generated. COMPARISON:  None. FINDINGS: CT HEAD FINDINGS Brain: Mild, likely age-appropriate atrophy with sulcal prominence. Scattered periventricular hypodensities compatible microvascular ischemic disease. No CT evidence superimposed acute large territory infarct. No intraparenchymal or extra-axial mass or hemorrhage. Mild asymmetric prominence of the occipital horn of the left lateral ventricle without associated midline shift. Vascular: Intracranial atherosclerosis. Skull: Soft tissue swelling about the left orbit without associated fracture or radiopaque foreign body. Other: Limited visualization the paranasal sinuses is normal. No air-fluid levels. Post bilateral cataract surgery. CT MAXILLOFACIAL FINDINGS Osseous: No displaced calvarial or facial fracture. No displaced nasal bone fracture. Normal appearance of the bilateral pterygoid plates. The bilateral mandibular condyles are normally located. Normal appearance of the bilateral zygomatic arches. Orbits: Post  bilateral cataract surgery. Otherwise, normal appearance the bilateral globes. The left-sided periorbital hematoma extends to involve the anterior aspect of the orbit, however there is no retro bulbar hematoma. Sinuses: Paranasal sinuses are normally aerated. No air-fluid levels. Minimal leftward nasal septal deviation. Small amount of debris is seen within the bilateral external auditory canals, left greater than right. Soft tissues: Large left-sided periorbital hematoma with dominant component measuring at least 6.0 x 1.3 cm (image 72, series 8). CT CERVICAL SPINE FINDINGS Alignment: C1 to the superior endplate of T2 is imaged. Normal alignment of the cervical spine. No anterolisthesis or retrolisthesis. The bilateral facets are normally aligned. The dens is normally positioned between the lateral masses of C1. Normal atlantodental and atlantoaxial articulations. Skull base and vertebrae: Cervical vertebral body heights appear preserved. Soft tissues and spinal canal: Prevertebral soft tissues appear normal. Disc levels: Mild multilevel cervical spine DDD, worse at C5-C6 with disc space height loss, endplate irregularity and small posteriorly directed disc osteophyte complex at this location. Upper chest: Limited visualization of lung apices is normal. Other: Regional soft tissues appear normal. No bulky cervical lymphadenopathy on this noncontrast examination. Minimal amount of calcified atherosclerotic plaque within the bilateral carotid bulbs, left greater than right. Note is made of an approximately 1.1 x 0.7 cm hypoattenuating nodule within the right-sided the thyroid isthmus (  image 85, series 17), of doubtful clinical concern. IMPRESSION: 1. Soft tissue swelling about the left orbit without associated displaced calvarial or facial fracture, radiopaque foreign body or acute intracranial process. 2. No displaced fracture or static subluxation of the cervical spine. 3. Mild likely age-appropriate atrophy and  microvascular ischemic disease. 4. Mild multilevel cervical spine DDD, worse at C5-C6. Electronically Signed   By: Simonne Come M.D.   On: 11/13/2017 11:16   Ct Angio Chest Pe W/cm &/or Wo Cm  Result Date: 11/13/2017 CLINICAL DATA:  Patient fell this morning with elevated troponin. EXAM: CT ANGIOGRAPHY CHEST WITH CONTRAST TECHNIQUE: Multidetector CT imaging of the chest was performed using the standard protocol during bolus administration of intravenous contrast. Multiplanar CT image reconstructions and MIPs were obtained to evaluate the vascular anatomy. CONTRAST:  80mL ISOVUE-370 IOPAMIDOL (ISOVUE-370) INJECTION 76% COMPARISON:  07/20/2008 FINDINGS: Cardiovascular: Satisfactory opacification of pulmonary arteries. Bilateral central pulmonary emboli to all lobes starting at the branch point of the right and left main pulmonary arteries. Right heart strain with RV/LV ratio 1.38. Conventional branch pattern of the great vessels with atherosclerosis. Moderate aortic atherosclerosis without aneurysm. Left main and three-vessel coronary arteriosclerosis. Mediastinum/Nodes: Moderate-sized hiatal hernia. No lymphadenopathy. Trachea and esophagus are unremarkable. No thyromegaly or mass. Lungs/Pleura: No acute pulmonary consolidation, effusion or pneumothorax. Tiny subpleural nodular opacities in the left upper lobe anteriorly may reflect minimal areas of bronchiolitis. Bibasilar subsegmental atelectasis is noted. 5 mm right middle lobe nodule is noted, series 7/49. A few areas of bronchial calcifications are identified peripherally within the right middle lobe adjacent to this nodular opacity. Upper Abdomen: No acute abnormality. Small calcification along the capsule of the left upper pole of the kidney. Musculoskeletal: Mild degenerative change along the thoracic spine with chronic appearing mild central compression of T4, anterior height loss of T6 and vertebral hemangioma of T9. Review of the MIP images confirms the  above findings. IMPRESSION: 1. Positive for acute PE with CT evidence of right heart strain (RV/LV Ratio = 1.36) consistent with at least submassive (intermediate risk) PE. The presence of right heart strain has been associated with an increased risk of morbidity and mortality. Please activate Code PE by paging [phone]. These results were called by telephone at the time of interpretation on 11/13/2017 at 1:35 pm to Dr. Pricilla Loveless , who verbally acknowledged these results. 2. 5 mm right middle lobe nodule. No follow-up needed if patient is low-risk. Non-contrast chest CT can be considered in 12 months if patient is high-risk. This recommendation follows the consensus statement: Guidelines for Management of Incidental Pulmonary Nodules Detected on CT Images: From the Fleischner Society 2017; Radiology 2017; 284:228-243. 3. Coronary arteriosclerosis and aortic atherosclerosis. Aortic Atherosclerosis (ICD10-I70.0). Electronically Signed   By: Tollie Eth M.D.   On: 11/13/2017 13:37   Ct Cervical Spine Wo Contrast  Result Date: 11/13/2017 CLINICAL DATA:  Tripped over rug hitting left side of face, now with left orbital hematoma. EXAM: CT HEAD WITHOUT CONTRAST CT MAXILLOFACIAL WITHOUT CONTRAST CT CERVICAL SPINE WITHOUT CONTRAST TECHNIQUE: Multidetector CT imaging of the head, cervical spine, and maxillofacial structures were performed using the standard protocol without intravenous contrast. Multiplanar CT image reconstructions of the cervical spine and maxillofacial structures were also generated. COMPARISON:  None. FINDINGS: CT HEAD FINDINGS Brain: Mild, likely age-appropriate atrophy with sulcal prominence. Scattered periventricular hypodensities compatible microvascular ischemic disease. No CT evidence superimposed acute large territory infarct. No intraparenchymal or extra-axial mass or hemorrhage. Mild asymmetric prominence of the occipital horn of  the left lateral ventricle without associated midline  shift. Vascular: Intracranial atherosclerosis. Skull: Soft tissue swelling about the left orbit without associated fracture or radiopaque foreign body. Other: Limited visualization the paranasal sinuses is normal. No air-fluid levels. Post bilateral cataract surgery. CT MAXILLOFACIAL FINDINGS Osseous: No displaced calvarial or facial fracture. No displaced nasal bone fracture. Normal appearance of the bilateral pterygoid plates. The bilateral mandibular condyles are normally located. Normal appearance of the bilateral zygomatic arches. Orbits: Post bilateral cataract surgery. Otherwise, normal appearance the bilateral globes. The left-sided periorbital hematoma extends to involve the anterior aspect of the orbit, however there is no retro bulbar hematoma. Sinuses: Paranasal sinuses are normally aerated. No air-fluid levels. Minimal leftward nasal septal deviation. Small amount of debris is seen within the bilateral external auditory canals, left greater than right. Soft tissues: Large left-sided periorbital hematoma with dominant component measuring at least 6.0 x 1.3 cm (image 72, series 8). CT CERVICAL SPINE FINDINGS Alignment: C1 to the superior endplate of T2 is imaged. Normal alignment of the cervical spine. No anterolisthesis or retrolisthesis. The bilateral facets are normally aligned. The dens is normally positioned between the lateral masses of C1. Normal atlantodental and atlantoaxial articulations. Skull base and vertebrae: Cervical vertebral body heights appear preserved. Soft tissues and spinal canal: Prevertebral soft tissues appear normal. Disc levels: Mild multilevel cervical spine DDD, worse at C5-C6 with disc space height loss, endplate irregularity and small posteriorly directed disc osteophyte complex at this location. Upper chest: Limited visualization of lung apices is normal. Other: Regional soft tissues appear normal. No bulky cervical lymphadenopathy on this noncontrast examination. Minimal  amount of calcified atherosclerotic plaque within the bilateral carotid bulbs, left greater than right. Note is made of an approximately 1.1 x 0.7 cm hypoattenuating nodule within the right-sided the thyroid isthmus (image 85, series 17), of doubtful clinical concern. IMPRESSION: 1. Soft tissue swelling about the left orbit without associated displaced calvarial or facial fracture, radiopaque foreign body or acute intracranial process. 2. No displaced fracture or static subluxation of the cervical spine. 3. Mild likely age-appropriate atrophy and microvascular ischemic disease. 4. Mild multilevel cervical spine DDD, worse at C5-C6. Electronically Signed   By: Simonne Come M.D.   On: 11/13/2017 11:16   Ct Maxillofacial Wo Cm  Result Date: 11/13/2017 CLINICAL DATA:  Tripped over rug hitting left side of face, now with left orbital hematoma. EXAM: CT HEAD WITHOUT CONTRAST CT MAXILLOFACIAL WITHOUT CONTRAST CT CERVICAL SPINE WITHOUT CONTRAST TECHNIQUE: Multidetector CT imaging of the head, cervical spine, and maxillofacial structures were performed using the standard protocol without intravenous contrast. Multiplanar CT image reconstructions of the cervical spine and maxillofacial structures were also generated. COMPARISON:  None. FINDINGS: CT HEAD FINDINGS Brain: Mild, likely age-appropriate atrophy with sulcal prominence. Scattered periventricular hypodensities compatible microvascular ischemic disease. No CT evidence superimposed acute large territory infarct. No intraparenchymal or extra-axial mass or hemorrhage. Mild asymmetric prominence of the occipital horn of the left lateral ventricle without associated midline shift. Vascular: Intracranial atherosclerosis. Skull: Soft tissue swelling about the left orbit without associated fracture or radiopaque foreign body. Other: Limited visualization the paranasal sinuses is normal. No air-fluid levels. Post bilateral cataract surgery. CT MAXILLOFACIAL FINDINGS Osseous:  No displaced calvarial or facial fracture. No displaced nasal bone fracture. Normal appearance of the bilateral pterygoid plates. The bilateral mandibular condyles are normally located. Normal appearance of the bilateral zygomatic arches. Orbits: Post bilateral cataract surgery. Otherwise, normal appearance the bilateral globes. The left-sided periorbital hematoma extends to involve the  anterior aspect of the orbit, however there is no retro bulbar hematoma. Sinuses: Paranasal sinuses are normally aerated. No air-fluid levels. Minimal leftward nasal septal deviation. Small amount of debris is seen within the bilateral external auditory canals, left greater than right. Soft tissues: Large left-sided periorbital hematoma with dominant component measuring at least 6.0 x 1.3 cm (image 72, series 8). CT CERVICAL SPINE FINDINGS Alignment: C1 to the superior endplate of T2 is imaged. Normal alignment of the cervical spine. No anterolisthesis or retrolisthesis. The bilateral facets are normally aligned. The dens is normally positioned between the lateral masses of C1. Normal atlantodental and atlantoaxial articulations. Skull base and vertebrae: Cervical vertebral body heights appear preserved. Soft tissues and spinal canal: Prevertebral soft tissues appear normal. Disc levels: Mild multilevel cervical spine DDD, worse at C5-C6 with disc space height loss, endplate irregularity and small posteriorly directed disc osteophyte complex at this location. Upper chest: Limited visualization of lung apices is normal. Other: Regional soft tissues appear normal. No bulky cervical lymphadenopathy on this noncontrast examination. Minimal amount of calcified atherosclerotic plaque within the bilateral carotid bulbs, left greater than right. Note is made of an approximately 1.1 x 0.7 cm hypoattenuating nodule within the right-sided the thyroid isthmus (image 85, series 17), of doubtful clinical concern. IMPRESSION: 1. Soft tissue  swelling about the left orbit without associated displaced calvarial or facial fracture, radiopaque foreign body or acute intracranial process. 2. No displaced fracture or static subluxation of the cervical spine. 3. Mild likely age-appropriate atrophy and microvascular ischemic disease. 4. Mild multilevel cervical spine DDD, worse at C5-C6. Electronically Signed   By: Simonne Come M.D.   On: 11/13/2017 11:16    ASSESSMENT / PLAN:  Acute Hypoxic Respiratory Distress in setting of Submassive PE with Right Heart Strain  Plan  -Wean Supplemental Oxygen to Maintain Saturation >92 (patient with no distress, currently on 2L [place], and hemodynamically stable, troponin 0.07)  -Follow up on Lower Extremity U/S  -Continue Heparin Gtt  -Patient is not a candidate for EKOS given recent fall    At this we will sign off, if needed please re-consult.   Jovita Kussmaul, AGACNP-BC [facility]  Pgr: [phone]  PCCM Pgr: [phone]

## 2017-11-13 NOTE — ED Notes (Signed)
Pt transported to CT ?

## 2017-11-13 NOTE — ED Notes (Signed)
CRITICAL VALUE ALERT  Critical Value:  Troponin 0.07  Date & Time Notied:  11/13/2017 1207  Provider Notified: Dr. Criss Alvine  Orders Received/Actions taken: No further orders at this time

## 2017-11-13 NOTE — ED Provider Notes (Signed)
LACERATION REPAIR Performed by: Bethel Born Authorized by: Bethel Born Consent: Verbal consent obtained. Risks and benefits: risks, benefits and alternatives were discussed Consent given by: patient Patient identity confirmed: provided demographic data Prepped and Draped in normal sterile fashion Wound explored  Laceration Location: Left temple/face  Laceration Length: 1 cm  No Foreign Bodies seen or palpated  Anesthesia: local infiltration  Local anesthetic: lidocaine 2% with epinephrine  Anesthetic total: 4 ml  Irrigation method: syringe Amount of cleaning: standard  Skin closure: 6-0 Nylon  Number of sutures: 2  Technique: Simple interrupted  Patient tolerance: Patient tolerated the procedure well with no immediate complications.    Bethel Born, PA-C 11/13/17 1347    Pricilla Loveless, MD 11/13/17 [phone]

## 2017-11-14 ENCOUNTER — Inpatient Hospital Stay (HOSPITAL_COMMUNITY): Payer: Medicare HMO

## 2017-11-14 DIAGNOSIS — I2699 Other pulmonary embolism without acute cor pulmonale: Secondary | ICD-10-CM

## 2017-11-14 DIAGNOSIS — I2609 Other pulmonary embolism with acute cor pulmonale: Principal | ICD-10-CM

## 2017-11-14 LAB — BASIC METABOLIC PANEL
Anion gap: 8 (ref 5–15)
BUN: 23 mg/dL — ABNORMAL HIGH (ref 6–20)
CALCIUM: 8.6 mg/dL — AB (ref 8.9–10.3)
CO2: 23 mmol/L (ref 22–32)
CREATININE: 1.27 mg/dL — AB (ref 0.44–1.00)
Chloride: 108 mmol/L (ref 101–111)
GFR calc Af Amer: 41 mL/min — ABNORMAL LOW (ref >60)
GFR, EST NON AFRICAN AMERICAN: 36 mL/min — AB (ref >60)
GLUCOSE: 100 mg/dL — AB (ref 65–99)
Potassium: 4.2 mmol/L (ref 3.5–5.1)
Sodium: 139 mmol/L (ref 135–145)

## 2017-11-14 LAB — CBC
HCT: 38.6 % (ref 36.0–46.0)
Hemoglobin: 11.9 g/dL — ABNORMAL LOW (ref 12.0–15.0)
MCH: 27.2 pg (ref 26.0–34.0)
MCHC: 30.8 g/dL (ref 30.0–36.0)
MCV: 88.3 fL (ref 78.0–100.0)
PLATELETS: 152 10*3/uL (ref 150–400)
RBC: 4.37 MIL/uL (ref 3.87–5.11)
RDW: 18.1 % — AB (ref 11.5–15.5)
WBC: 9.6 10*3/uL (ref 4.0–10.5)

## 2017-11-14 LAB — HEPARIN LEVEL (UNFRACTIONATED)
HEPARIN UNFRACTIONATED: 1.1 [IU]/mL — AB (ref 0.30–0.70)
Heparin Unfractionated: 0.79 IU/mL — ABNORMAL HIGH (ref 0.30–0.70)
Heparin Unfractionated: 0.5 IU/mL (ref 0.30–0.70)

## 2017-11-14 MED ORDER — HEPARIN (PORCINE) IN NACL 100-0.45 UNIT/ML-% IJ SOLN
800.0000 [IU]/h | INTRAMUSCULAR | Status: DC
  Administered 2017-11-14: 900 [IU]/h via INTRAVENOUS
  Filled 2017-11-14: qty 250

## 2017-11-14 NOTE — Progress Notes (Signed)
Preliminary notes--Bilateral lower extremities venous duplex exam completed. Positive for DVT involving right femoral vein distal, popliteal vein , PTV and Peroneal vein; Left femoral distal.   Incidental finding:  3.61x1.50x1.24cm complex fluid collection seen at right popliteal fossa.   [name] (RDMS RVT) 11/14/17 2:44 PM

## 2017-11-14 NOTE — Progress Notes (Signed)
ANTICOAGULATION CONSULT NOTE - Follow up Consult  Pharmacy Consult for heparin Indication: pulmonary embolus  Allergies  Allergen Reactions  . Codeine Nausea And Vomiting and Other (See Comments)    REACTION: dizzy- but can tolerate hydrocodone.   . Levofloxacin Other (See Comments)    REACTION: itching  . Neospect [Dyphylline-Ephed-Pb-Gg Or] Other (See Comments)    unknown  . Spironolactone     Hyperkalemia   . Tramadol     Didn't help with pain  . Neosporin [Neomycin-Bacitracin Zn-Polymyx] Rash    Patient Measurements: Height:  (157.5 cm) Weight: 160 lb 0.9 oz (72.6 kg) IBW/kg (Calculated) : 50.1 Heparin Dosing Weight: 65kg  Vital Signs: Temp: 98.4 F (36.9 C) (05/25 1103) Temp Source: Oral (05/25 1103) BP: 109/61 (05/25 1412) Pulse Rate: 64 (05/25 1412)  Labs: Recent Labs    11/13/17 1036 11/13/17 2057 11/14/17 0243 11/14/17 1404  HGB 13.2 12.9 11.9*  --   HCT 42.7 42.3 38.6  --   PLT 183 163 152  --   HEPARINUNFRC  --   --  1.10* 0.79*  CREATININE 1.34*  --  1.27*  --   TROPONINI 0.07*  --   --   --     Estimated Creatinine Clearance: 26.4 mL/min (A) (by C-G formula based on SCr of 1.27 mg/dL (H)).   Medical History: Past Medical History:  Diagnosis Date  . AK (actinic keratosis)   . Allergy   . Chronic dermatitis    face, allergic to many products  . GERD (gastroesophageal reflux disease)   . Hyperlipidemia   . Hypertension   . Kidney stones   . Overactive bladder   . Sleep apnea   . Syncope     Medications:  Infusions:  . heparin 900 Units/hr (11/14/17 0615)    Assessment: 74 yof presented s/p fall found to have a PE with right heart strain. To start IV heparin. Baseline CBC is WNL and she is not on anticoagulation PTA.   Heparin level is SUPRAtherapeutic at 0.79, running at 900/hr per RN. Per MD, likely to transition to apixaban tomorrow.  Goal of Therapy:  Heparin level 0.3-0.7 units/ml Monitor platelets by anticoagulation  protocol: Yes   Plan:  Decrease Heparin gtt to 800 units/hr Check an 8 hr heparin level Daily heparin level and CBC   Thank you for allowing [place] to participate in this patients care.  Fredonia Highland, PharmD, BCPS PGY-2 Cardiology Pharmacy Resident Pager: [phone] 11/14/2017

## 2017-11-14 NOTE — Progress Notes (Signed)
PROGRESS NOTE    [name]  ZOX:[id number] DOB: 01/30/1924 DOA: 11/13/2017 PCP: Joaquim Nam, MD    Brief Narrative:  84 y.o. female with medical history significant for sleep apnea, systolic heart failure, pretension, hyperlipidemia, arthritis, GERD, who lives in assisted living facility, brought to the emergency department after feeling lightheaded while she was standing up, falling on her left side of her face and eyelid, and not losing consciousness.  Since falling, she felt very short of breath.  She denies any chest pain or palpitations.  The patient denies any focal weakness.  Of note, over the last few months, she has been feeling tired, and experience lower extremity swelling, mostly on the right, that she attributed to her arthritis.  She denies any pleuritic chest pain palpitations or chest wall pain.  She denies any fever, chills, night sweats or hemoptysis.  She denies any other prior presyncopal or syncopal episodes.  She denies any recent diagnosis of cancer.  She denies any tobacco abuse.  She is not on any hormonal replacement therapy.  No recent long distance trips.  She is fairly inactive.  She denies any prior history of PE or DVT.  She denies any significant amount of NSAIDs.  She is on iron supplement at home.  She has never been seen by a hematologist, or had any bowel marrow biopsy in the past.  She denies any recent infections or sick contacts.  No confusion is reported.  She is not on aspirin daily  Pulmonary Embolism, Bilateral. PESI score  at 8.9% risk of death.  Syncope..   -CT Angio of the chest showed bilateral PE, with right heart strain..  -Echo with severe RV dysfunction.  -Seen by CCM. Not candidate for lytics -Lower extremity Dopplers reviewed, findings of bilateral lower extremity DVT -Discussed case with patient's daughter.  Family is adamant about returning to independent living at [facility] not interested in skilled nursing level or any other facility.   Have placed home health orders for PT/OT/RN  Syncope likely due to above  -Continue with fall precautions -Patient ambulatory and stable thus far  Chronic  systolic and diastolic CHF   -last 2D echo in June 2017 showed EF 30 to 35%, grade 1 diastolic, moderately to severely reduced systolic. -Coreg had been on hold at time of admission -Appears to be euvolemic at present  OSA -Continue CPAP nightly  Hyperlipidemia -Continue home statins as tolerated  Hypertension BP   94/49   Pulse 77   In the setting of PE  -Blood pressure medications held at time of admission -Continue to monitor for now    Assessment & Plan:   Principal Problem:   Pulmonary embolism (HCC) Active Problems:   Elevated lipids   Essential hypertension   GERD   OVERACTIVE BLADDER   Sleep apnea   Syncope   Bilateral pulmonary embolism (HCC) 1.   DVT prophylaxis: Heparin drip Code Status: Partial code Family Communication: Patient in room, family at bedside Disposition Plan: Possible discharge in the next 24 to 48 hours to [facility] independent facility  Consultants:   PCCM  Cardiology  Procedures:     Antimicrobials: Anti-infectives (From admission, onward)   None       Subjective: Without complaints.  Denies shortness of breath  Objective: Vitals:   11/14/17 0936 11/14/17 1103 11/14/17 1200 11/14/17 1412  BP: 98/72 91/60  109/61  Pulse: 66 64 68 64  Resp: 20 (!) 24 (!) 24 (!) 22  Temp:  98.4  F (36.9 C)    TempSrc:  Oral    SpO2: 95% 94% 91% 94%  Weight:      Height:        Intake/Output Summary (Last 24 hours) at 11/14/2017 1605 Last data filed at 11/14/2017 1500 Gross per 24 hour  Intake 525.83 ml  Output 550 ml  Net -24.17 ml   Filed Weights   11/13/17 1021 11/14/17 0500  Weight: 70.3 kg (155 lb) 72.6 kg (160 lb 0.9 oz)    Examination:  General exam: Appears calm and comfortable  Respiratory system: Clear to auscultation. Respiratory effort  normal. Cardiovascular system: S1 & S2 heard, RRR. No JVD, murmurs, rubs, gallops or clicks. No pedal edema. Gastrointestinal system: Abdomen is nondistended, soft and nontender. No organomegaly or masses felt. Normal bowel sounds heard. Central nervous system: Alert and oriented. No focal neurological deficits. Extremities: Symmetric 5 x 5 power. Skin: No rashes, lesions  Psychiatry: Judgement and insight appear normal. Mood & affect appropriate.   Data Reviewed: I have personally reviewed following labs and imaging studies  CBC: Recent Labs  Lab 11/13/17 1036 11/13/17 2057 11/14/17 0243  WBC 14.8* 9.8 9.6  NEUTROABS 11.5*  --   --   HGB 13.2 12.9 11.9*  HCT 42.7 42.3 38.6  MCV 87.9 90.4 88.3  PLT 183 163 152   Basic Metabolic Panel: Recent Labs  Lab 11/13/17 1036 11/14/17 0243  NA 140 139  K 4.5 4.2  CL 107 108  CO2 23 23  GLUCOSE 149* 100*  BUN 26* 23*  CREATININE 1.34* 1.27*  CALCIUM 8.9 8.6*   GFR: Estimated Creatinine Clearance: 26.4 mL/min (A) (by C-G formula based on SCr of 1.27 mg/dL (H)). Liver Function Tests: No results for input(s): AST, ALT, ALKPHOS, BILITOT, PROT, ALBUMIN in the last 168 hours. No results for input(s): LIPASE, AMYLASE in the last 168 hours. No results for input(s): AMMONIA in the last 168 hours. Coagulation Profile: No results for input(s): INR, PROTIME in the last 168 hours. Cardiac Enzymes: Recent Labs  Lab 11/13/17 1036  TROPONINI 0.07*   BNP (last 3 results) Recent Labs    07/27/17 1303  PROBNP 625   HbA1C: No results for input(s): HGBA1C in the last 72 hours. CBG: No results for input(s): GLUCAP in the last 168 hours. Lipid Profile: No results for input(s): CHOL, HDL, LDLCALC, TRIG, CHOLHDL, LDLDIRECT in the last 72 hours. Thyroid Function Tests: No results for input(s): TSH, T4TOTAL, FREET4, T3FREE, THYROIDAB in the last 72 hours. Anemia Panel: No results for input(s): VITAMINB12, FOLATE, FERRITIN, TIBC, IRON,  RETICCTPCT in the last 72 hours. Sepsis Labs: No results for input(s): PROCALCITON, LATICACIDVEN in the last 168 hours.  Recent Results (from the past 240 hour(s))  MRSA PCR Screening     Status: None   Collection Time: 11/13/17  5:05 PM  Result Value Ref Range Status   MRSA by PCR NEGATIVE NEGATIVE Final    Comment:        The GeneXpert MRSA Assay (FDA approved for NASAL specimens only), is one component of a comprehensive MRSA colonization surveillance program. It is not intended to diagnose MRSA infection nor to guide or monitor treatment for MRSA infections. Performed at [facility], [address], [place], [place] [address]      Radiology Studies: Dg Chest 2 View  Result Date: 11/13/2017 CLINICAL DATA:  Hypoxia following recent fall EXAM: CHEST - 2 VIEW COMPARISON:  05/18/2013 FINDINGS: Cardiac shadow is stable. Large hiatal hernia  is again seen. Stable aortic calcifications are noted. No focal infiltrate or sizable effusion is seen. No acute bony abnormality is noted. Midthoracic compression deformity is noted and stable. IMPRESSION: No acute abnormality noted. Chronic changes as described. Electronically Signed   By: Alcide Clever M.D.   On: 11/13/2017 11:25   Ct Head Wo Contrast  Result Date: 11/13/2017 CLINICAL DATA:  Tripped over rug hitting left side of face, now with left orbital hematoma. EXAM: CT HEAD WITHOUT CONTRAST CT MAXILLOFACIAL WITHOUT CONTRAST CT CERVICAL SPINE WITHOUT CONTRAST TECHNIQUE: Multidetector CT imaging of the head, cervical spine, and maxillofacial structures were performed using the standard protocol without intravenous contrast. Multiplanar CT image reconstructions of the cervical spine and maxillofacial structures were also generated. COMPARISON:  None. FINDINGS: CT HEAD FINDINGS Brain: Mild, likely age-appropriate atrophy with sulcal prominence. Scattered periventricular hypodensities compatible microvascular ischemic disease. No CT evidence  superimposed acute large territory infarct. No intraparenchymal or extra-axial mass or hemorrhage. Mild asymmetric prominence of the occipital horn of the left lateral ventricle without associated midline shift. Vascular: Intracranial atherosclerosis. Skull: Soft tissue swelling about the left orbit without associated fracture or radiopaque foreign body. Other: Limited visualization the paranasal sinuses is normal. No air-fluid levels. Post bilateral cataract surgery. CT MAXILLOFACIAL FINDINGS Osseous: No displaced calvarial or facial fracture. No displaced nasal bone fracture. Normal appearance of the bilateral pterygoid plates. The bilateral mandibular condyles are normally located. Normal appearance of the bilateral zygomatic arches. Orbits: Post bilateral cataract surgery. Otherwise, normal appearance the bilateral globes. The left-sided periorbital hematoma extends to involve the anterior aspect of the orbit, however there is no retro bulbar hematoma. Sinuses: Paranasal sinuses are normally aerated. No air-fluid levels. Minimal leftward nasal septal deviation. Small amount of debris is seen within the bilateral external auditory canals, left greater than right. Soft tissues: Large left-sided periorbital hematoma with dominant component measuring at least 6.0 x 1.3 cm (image 72, series 8). CT CERVICAL SPINE FINDINGS Alignment: C1 to the superior endplate of T2 is imaged. Normal alignment of the cervical spine. No anterolisthesis or retrolisthesis. The bilateral facets are normally aligned. The dens is normally positioned between the lateral masses of C1. Normal atlantodental and atlantoaxial articulations. Skull base and vertebrae: Cervical vertebral body heights appear preserved. Soft tissues and spinal canal: Prevertebral soft tissues appear normal. Disc levels: Mild multilevel cervical spine DDD, worse at C5-C6 with disc space height loss, endplate irregularity and small posteriorly directed disc osteophyte  complex at this location. Upper chest: Limited visualization of lung apices is normal. Other: Regional soft tissues appear normal. No bulky cervical lymphadenopathy on this noncontrast examination. Minimal amount of calcified atherosclerotic plaque within the bilateral carotid bulbs, left greater than right. Note is made of an approximately 1.1 x 0.7 cm hypoattenuating nodule within the right-sided the thyroid isthmus (image 85, series 17), of doubtful clinical concern. IMPRESSION: 1. Soft tissue swelling about the left orbit without associated displaced calvarial or facial fracture, radiopaque foreign body or acute intracranial process. 2. No displaced fracture or static subluxation of the cervical spine. 3. Mild likely age-appropriate atrophy and microvascular ischemic disease. 4. Mild multilevel cervical spine DDD, worse at C5-C6. Electronically Signed   By: Simonne Come M.D.   On: 11/13/2017 11:16   Ct Angio Chest Pe W/cm &/or Wo Cm  Result Date: 11/13/2017 CLINICAL DATA:  Patient fell this morning with elevated troponin. EXAM: CT ANGIOGRAPHY CHEST WITH CONTRAST TECHNIQUE: Multidetector CT imaging of the chest was performed using the standard protocol during bolus  administration of intravenous contrast. Multiplanar CT image reconstructions and MIPs were obtained to evaluate the vascular anatomy. CONTRAST:  80mL ISOVUE-370 IOPAMIDOL (ISOVUE-370) INJECTION 76% COMPARISON:  07/20/2008 FINDINGS: Cardiovascular: Satisfactory opacification of pulmonary arteries. Bilateral central pulmonary emboli to all lobes starting at the branch point of the right and left main pulmonary arteries. Right heart strain with RV/LV ratio 1.38. Conventional branch pattern of the great vessels with atherosclerosis. Moderate aortic atherosclerosis without aneurysm. Left main and three-vessel coronary arteriosclerosis. Mediastinum/Nodes: Moderate-sized hiatal hernia. No lymphadenopathy. Trachea and esophagus are unremarkable. No  thyromegaly or mass. Lungs/Pleura: No acute pulmonary consolidation, effusion or pneumothorax. Tiny subpleural nodular opacities in the left upper lobe anteriorly may reflect minimal areas of bronchiolitis. Bibasilar subsegmental atelectasis is noted. 5 mm right middle lobe nodule is noted, series 7/49. A few areas of bronchial calcifications are identified peripherally within the right middle lobe adjacent to this nodular opacity. Upper Abdomen: No acute abnormality. Small calcification along the capsule of the left upper pole of the kidney. Musculoskeletal: Mild degenerative change along the thoracic spine with chronic appearing mild central compression of T4, anterior height loss of T6 and vertebral hemangioma of T9. Review of the MIP images confirms the above findings. IMPRESSION: 1. Positive for acute PE with CT evidence of right heart strain (RV/LV Ratio = 1.36) consistent with at least submassive (intermediate risk) PE. The presence of right heart strain has been associated with an increased risk of morbidity and mortality. Please activate Code PE by paging [phone]. These results were called by telephone at the time of interpretation on 11/13/2017 at 1:35 pm to Dr. Pricilla Loveless , who verbally acknowledged these results. 2. 5 mm right middle lobe nodule. No follow-up needed if patient is low-risk. Non-contrast chest CT can be considered in 12 months if patient is high-risk. This recommendation follows the consensus statement: Guidelines for Management of Incidental Pulmonary Nodules Detected on CT Images: From the Fleischner Society 2017; Radiology 2017; 284:228-243. 3. Coronary arteriosclerosis and aortic atherosclerosis. Aortic Atherosclerosis (ICD10-I70.0). Electronically Signed   By: Tollie Eth M.D.   On: 11/13/2017 13:37   Ct Cervical Spine Wo Contrast  Result Date: 11/13/2017 CLINICAL DATA:  Tripped over rug hitting left side of face, now with left orbital hematoma. EXAM: CT HEAD WITHOUT  CONTRAST CT MAXILLOFACIAL WITHOUT CONTRAST CT CERVICAL SPINE WITHOUT CONTRAST TECHNIQUE: Multidetector CT imaging of the head, cervical spine, and maxillofacial structures were performed using the standard protocol without intravenous contrast. Multiplanar CT image reconstructions of the cervical spine and maxillofacial structures were also generated. COMPARISON:  None. FINDINGS: CT HEAD FINDINGS Brain: Mild, likely age-appropriate atrophy with sulcal prominence. Scattered periventricular hypodensities compatible microvascular ischemic disease. No CT evidence superimposed acute large territory infarct. No intraparenchymal or extra-axial mass or hemorrhage. Mild asymmetric prominence of the occipital horn of the left lateral ventricle without associated midline shift. Vascular: Intracranial atherosclerosis. Skull: Soft tissue swelling about the left orbit without associated fracture or radiopaque foreign body. Other: Limited visualization the paranasal sinuses is normal. No air-fluid levels. Post bilateral cataract surgery. CT MAXILLOFACIAL FINDINGS Osseous: No displaced calvarial or facial fracture. No displaced nasal bone fracture. Normal appearance of the bilateral pterygoid plates. The bilateral mandibular condyles are normally located. Normal appearance of the bilateral zygomatic arches. Orbits: Post bilateral cataract surgery. Otherwise, normal appearance the bilateral globes. The left-sided periorbital hematoma extends to involve the anterior aspect of the orbit, however there is no retro bulbar hematoma. Sinuses: Paranasal sinuses are normally aerated. No air-fluid levels. Minimal leftward  nasal septal deviation. Small amount of debris is seen within the bilateral external auditory canals, left greater than right. Soft tissues: Large left-sided periorbital hematoma with dominant component measuring at least 6.0 x 1.3 cm (image 72, series 8). CT CERVICAL SPINE FINDINGS Alignment: C1 to the superior endplate of  T2 is imaged. Normal alignment of the cervical spine. No anterolisthesis or retrolisthesis. The bilateral facets are normally aligned. The dens is normally positioned between the lateral masses of C1. Normal atlantodental and atlantoaxial articulations. Skull base and vertebrae: Cervical vertebral body heights appear preserved. Soft tissues and spinal canal: Prevertebral soft tissues appear normal. Disc levels: Mild multilevel cervical spine DDD, worse at C5-C6 with disc space height loss, endplate irregularity and small posteriorly directed disc osteophyte complex at this location. Upper chest: Limited visualization of lung apices is normal. Other: Regional soft tissues appear normal. No bulky cervical lymphadenopathy on this noncontrast examination. Minimal amount of calcified atherosclerotic plaque within the bilateral carotid bulbs, left greater than right. Note is made of an approximately 1.1 x 0.7 cm hypoattenuating nodule within the right-sided the thyroid isthmus (image 85, series 17), of doubtful clinical concern. IMPRESSION: 1. Soft tissue swelling about the left orbit without associated displaced calvarial or facial fracture, radiopaque foreign body or acute intracranial process. 2. No displaced fracture or static subluxation of the cervical spine. 3. Mild likely age-appropriate atrophy and microvascular ischemic disease. 4. Mild multilevel cervical spine DDD, worse at C5-C6. Electronically Signed   By: Simonne Come M.D.   On: 11/13/2017 11:16   Ct Maxillofacial Wo Cm  Result Date: 11/13/2017 CLINICAL DATA:  Tripped over rug hitting left side of face, now with left orbital hematoma. EXAM: CT HEAD WITHOUT CONTRAST CT MAXILLOFACIAL WITHOUT CONTRAST CT CERVICAL SPINE WITHOUT CONTRAST TECHNIQUE: Multidetector CT imaging of the head, cervical spine, and maxillofacial structures were performed using the standard protocol without intravenous contrast. Multiplanar CT image reconstructions of the cervical spine  and maxillofacial structures were also generated. COMPARISON:  None. FINDINGS: CT HEAD FINDINGS Brain: Mild, likely age-appropriate atrophy with sulcal prominence. Scattered periventricular hypodensities compatible microvascular ischemic disease. No CT evidence superimposed acute large territory infarct. No intraparenchymal or extra-axial mass or hemorrhage. Mild asymmetric prominence of the occipital horn of the left lateral ventricle without associated midline shift. Vascular: Intracranial atherosclerosis. Skull: Soft tissue swelling about the left orbit without associated fracture or radiopaque foreign body. Other: Limited visualization the paranasal sinuses is normal. No air-fluid levels. Post bilateral cataract surgery. CT MAXILLOFACIAL FINDINGS Osseous: No displaced calvarial or facial fracture. No displaced nasal bone fracture. Normal appearance of the bilateral pterygoid plates. The bilateral mandibular condyles are normally located. Normal appearance of the bilateral zygomatic arches. Orbits: Post bilateral cataract surgery. Otherwise, normal appearance the bilateral globes. The left-sided periorbital hematoma extends to involve the anterior aspect of the orbit, however there is no retro bulbar hematoma. Sinuses: Paranasal sinuses are normally aerated. No air-fluid levels. Minimal leftward nasal septal deviation. Small amount of debris is seen within the bilateral external auditory canals, left greater than right. Soft tissues: Large left-sided periorbital hematoma with dominant component measuring at least 6.0 x 1.3 cm (image 72, series 8). CT CERVICAL SPINE FINDINGS Alignment: C1 to the superior endplate of T2 is imaged. Normal alignment of the cervical spine. No anterolisthesis or retrolisthesis. The bilateral facets are normally aligned. The dens is normally positioned between the lateral masses of C1. Normal atlantodental and atlantoaxial articulations. Skull base and vertebrae: Cervical vertebral body  heights appear preserved. Soft  tissues and spinal canal: Prevertebral soft tissues appear normal. Disc levels: Mild multilevel cervical spine DDD, worse at C5-C6 with disc space height loss, endplate irregularity and small posteriorly directed disc osteophyte complex at this location. Upper chest: Limited visualization of lung apices is normal. Other: Regional soft tissues appear normal. No bulky cervical lymphadenopathy on this noncontrast examination. Minimal amount of calcified atherosclerotic plaque within the bilateral carotid bulbs, left greater than right. Note is made of an approximately 1.1 x 0.7 cm hypoattenuating nodule within the right-sided the thyroid isthmus (image 85, series 17), of doubtful clinical concern. IMPRESSION: 1. Soft tissue swelling about the left orbit without associated displaced calvarial or facial fracture, radiopaque foreign body or acute intracranial process. 2. No displaced fracture or static subluxation of the cervical spine. 3. Mild likely age-appropriate atrophy and microvascular ischemic disease. 4. Mild multilevel cervical spine DDD, worse at C5-C6. Electronically Signed   By: Simonne Come M.D.   On: 11/13/2017 11:16    Scheduled Meds: . carvedilol  3.125 mg Oral BID WC  . pravastatin  40 mg Oral QPM  . sacubitril-valsartan  1 tablet Oral BID   Continuous Infusions: . heparin 800 Units/hr (11/14/17 1455)     LOS: 1 day   Rickey Barbara, MD [facility] Pager [phone]  If 7PM-7AM, please contact night-coverage [email or web address] Password [facility] 11/14/2017, 4:05 PM

## 2017-11-14 NOTE — Progress Notes (Signed)
ANTICOAGULATION CONSULT NOTE - Follow up Consult  Pharmacy Consult for heparin Indication: pulmonary embolus  Allergies  Allergen Reactions  . Codeine Nausea And Vomiting and Other (See Comments)    REACTION: dizzy- but can tolerate hydrocodone.   . Levofloxacin Other (See Comments)    REACTION: itching  . Neospect [Dyphylline-Ephed-Pb-Gg Or] Other (See Comments)    unknown  . Spironolactone     Hyperkalemia   . Tramadol     Didn't help with pain  . Neosporin [Neomycin-Bacitracin Zn-Polymyx] Rash    Patient Measurements: Height:  (157.5 cm) Weight: 155 lb (70.3 kg) IBW/kg (Calculated) : 50.1 Heparin Dosing Weight: 65kg  Vital Signs: Temp: 97.4 F (36.3 C) (05/25 0346) Temp Source: Oral (05/25 0346) BP: 106/65 (05/25 0346) Pulse Rate: 66 (05/25 0346)  Labs: Recent Labs    11/13/17 1036 11/13/17 2057 11/14/17 0243  HGB 13.2 12.9 11.9*  HCT 42.7 42.3 38.6  PLT 183 163 152  HEPARINUNFRC  --   --  1.10*  CREATININE 1.34*  --  1.27*  TROPONINI 0.07*  --   --     Estimated Creatinine Clearance: 26 mL/min (A) (by C-G formula based on SCr of 1.27 mg/dL (H)).   Medical History: Past Medical History:  Diagnosis Date  . AK (actinic keratosis)   . Allergy   . Chronic dermatitis    face, allergic to many products  . GERD (gastroesophageal reflux disease)   . Hyperlipidemia   . Hypertension   . Kidney stones   . Overactive bladder   . Sleep apnea   . Syncope     Medications:  Infusions:  . heparin 1,100 Units/hr (11/13/17 1800)    Assessment: 89 yof presented s/p fall found to have a PE with right heart strain. To start IV heparin. Baseline CBC is WNL and she is not on anticoagulation PTA.   Heparin level is SUPRAtherapeutic this morning at 1.1, no bleeding or infusion problems per RN.  Goal of Therapy:  Heparin level 0.3-0.7 units/ml Monitor platelets by anticoagulation protocol: Yes   Plan:  Decrease Heparin gtt to 900 units/hr Check an 8 hr  heparin level Daily heparin level and CBC   Thank you for allowing [place] to participate in this patients care.  Signe Colt, PharmD Main pharmacy at: [phone] 11/14/2017 4:50 AM

## 2017-11-15 DIAGNOSIS — N318 Other neuromuscular dysfunction of bladder: Secondary | ICD-10-CM

## 2017-11-15 LAB — URINALYSIS, ROUTINE W REFLEX MICROSCOPIC
BILIRUBIN URINE: NEGATIVE
Glucose, UA: NEGATIVE mg/dL
KETONES UR: NEGATIVE mg/dL
Nitrite: NEGATIVE
Protein, ur: NEGATIVE mg/dL
Specific Gravity, Urine: 1.021 (ref 1.005–1.030)
pH: 5 (ref 5.0–8.0)

## 2017-11-15 LAB — BASIC METABOLIC PANEL
ANION GAP: 7 (ref 5–15)
BUN: 22 mg/dL — ABNORMAL HIGH (ref 6–20)
CALCIUM: 8.8 mg/dL — AB (ref 8.9–10.3)
CO2: 25 mmol/L (ref 22–32)
Chloride: 107 mmol/L (ref 101–111)
Creatinine, Ser: 1.32 mg/dL — ABNORMAL HIGH (ref 0.44–1.00)
GFR, EST AFRICAN AMERICAN: 39 mL/min — AB (ref >60)
GFR, EST NON AFRICAN AMERICAN: 34 mL/min — AB (ref >60)
GLUCOSE: 96 mg/dL (ref 65–99)
POTASSIUM: 4.6 mmol/L (ref 3.5–5.1)
Sodium: 139 mmol/L (ref 135–145)

## 2017-11-15 LAB — CBC
HCT: 39.4 % (ref 36.0–46.0)
HEMOGLOBIN: 11.9 g/dL — AB (ref 12.0–15.0)
MCH: 27.4 pg (ref 26.0–34.0)
MCHC: 30.2 g/dL (ref 30.0–36.0)
MCV: 90.6 fL (ref 78.0–100.0)
PLATELETS: 152 10*3/uL (ref 150–400)
RBC: 4.35 MIL/uL (ref 3.87–5.11)
RDW: 18.3 % — AB (ref 11.5–15.5)
WBC: 9.4 10*3/uL (ref 4.0–10.5)

## 2017-11-15 LAB — HEPARIN LEVEL (UNFRACTIONATED): HEPARIN UNFRACTIONATED: 0.44 [IU]/mL (ref 0.30–0.70)

## 2017-11-15 MED ORDER — APIXABAN 5 MG PO TABS
10.0000 mg | ORAL_TABLET | Freq: Two times a day (BID) | ORAL | Status: DC
  Administered 2017-11-15: 10 mg via ORAL
  Filled 2017-11-15: qty 2

## 2017-11-15 MED ORDER — APIXABAN 5 MG PO TABS: 5.0000 mg | ORAL_TABLET | Freq: Two times a day (BID) | ORAL | Status: DC

## 2017-11-15 MED ORDER — SACUBITRIL-VALSARTAN 24-26 MG PO TABS: 1.0000 | ORAL_TABLET | Freq: Two times a day (BID) | ORAL | 0 refills | Status: DC

## 2017-11-15 MED ORDER — CEFDINIR 300 MG PO CAPS: 300.0000 mg | ORAL_CAPSULE | ORAL | 0 refills | Status: DC

## 2017-11-15 MED ORDER — ELIQUIS 5 MG VTE STARTER PACK: ORAL_TABLET | ORAL | 0 refills | Status: DC

## 2017-11-15 MED ORDER — CEFDINIR 300 MG PO CAPS
300.0000 mg | ORAL_CAPSULE | ORAL | Status: DC
  Administered 2017-11-15: 300 mg via ORAL
  Filled 2017-11-15: qty 1

## 2017-11-15 MED ORDER — APIXABAN 5 MG PO TABS: 5.0000 mg | ORAL_TABLET | Freq: Two times a day (BID) | ORAL | 0 refills | Status: DC

## 2017-11-15 NOTE — Progress Notes (Signed)
ANTICOAGULATION CONSULT NOTE   Pharmacy Consult for heparin Indication: pulmonary embolus  Allergies  Allergen Reactions  . Codeine Nausea And Vomiting and Other (See Comments)    REACTION: dizzy- but can tolerate hydrocodone.   . Levofloxacin Other (See Comments)    REACTION: itching  . Neospect [Dyphylline-Ephed-Pb-Gg Or] Other (See Comments)    unknown  . Spironolactone     Hyperkalemia   . Tramadol     Didn't help with pain  . Neosporin [Neomycin-Bacitracin Zn-Polymyx] Rash    Patient Measurements: Height:  (157.5 cm) Weight: 160 lb 0.9 oz (72.6 kg) IBW/kg (Calculated) : 50.1 Heparin Dosing Weight: 65kg  Vital Signs: Temp: 97.7 F (36.5 C) (05/25 2325) Temp Source: Oral (05/25 2325) BP: 122/71 (05/25 2325) Pulse Rate: 69 (05/25 2325)  Labs: Recent Labs    11/13/17 1036 11/13/17 2057 11/14/17 0243 11/14/17 1404 11/14/17 2246  HGB 13.2 12.9 11.9*  --   --   HCT 42.7 42.3 38.6  --   --   PLT 183 163 152  --   --   HEPARINUNFRC  --   --  1.10* 0.79* 0.50  CREATININE 1.34*  --  1.27*  --   --   TROPONINI 0.07*  --   --   --   --     Estimated Creatinine Clearance: 26.4 mL/min (A) (by C-G formula based on SCr of 1.27 mg/dL (H)).   Assessment: 84 y.o. female with PE for heparin  Goal of Therapy:  Heparin level 0.3-0.7 units/ml Monitor platelets by anticoagulation protocol: Yes   Plan:  Continue Heparin at current rate  Geannie Risen, PharmD, BCPS

## 2017-11-15 NOTE — Discharge Summary (Addendum)
Physician Discharge Summary  [name] ZOX:[id number] DOB: 11-17-1923 DOA: 11/13/2017  PCP: Joaquim Nam, MD  Admit date: 11/13/2017 Discharge date: 11/15/2017  Admitted From: Independent Living Disposition:  Independent Living  Recommendations for Outpatient Follow-up:  1. Follow up with PCP in 1-2 weeks 2. Follow up with Cardiology as scheduled  Home Health:PT, RN  Equipment/Devices:home O2    Discharge Condition:Stable CODE STATUS:Partial Diet recommendation: Heart healthy   Brief/Interim Summary: 84 y.o.femalewith medical history significant forsleep apnea, systolic heart failure, pretension, hyperlipidemia, arthritis, GERD, who lives in assisted living facility, brought to the emergency department after feeling lightheaded while she was standing up, falling on her left side of her face and eyelid, and not losing consciousness. Since falling, she felt very short of breath. She denies any chest pain or palpitations. The patient denies any focal weakness. Of note, over the last few months, she has been feeling tired, and experience lower extremity swelling, mostly on the right, that she attributed to her arthritis. She denies any pleuritic chest pain palpitations or chest wall pain. She denies any fever, chills, night sweats or hemoptysis. She denies any other prior presyncopal or syncopal episodes. She denies any recent diagnosis of cancer. She denies any tobacco abuse. She is not on any hormonal replacement therapy. No recent long distance trips. She is fairly inactive. She denies any prior history of PE or DVT. She denies any significant amount of NSAIDs. She is on iron supplement at home. She has never been seen by a hematologist, or had any bowel marrow biopsy in the past. She denies any recent infections or sick contacts. No confusion is reported. She is not on aspirin daily  Pulmonary Embolism, Bilateral. PESI scoreat 8.9% risk of death.Syncope..  -CT  Angioof the chest showed bilateral PE, with right heart strain..  -Echo with severe RV dysfunction.  -Seen by CCM. Not candidate for lytics -Lower extremity Dopplers reviewed, findings of bilateral lower extremity DVT -Discussed case with patient's daughter.  Family is adamant about returning to independent living at [facility] not interested in skilled nursing level or any other facility.  Have placed home health orders for PT/OT/RN -Patient meets home O2 requirements. See PT note. Sats 80% on room air -Will discharge on eliquis  Syncopelikely due to above -Continue with fall precautions -Patient ambulatory and stable thus far  Chronic systolic and diastolic CHF -last 2D echo in June 2017 showed EF 30 to 35%, grade 1 diastolic, moderately to severely reduced systolic. -Coreg had been on hold at time of admission, resumed -Continue entresto as tolerated -Appears to be euvolemic at present  OSA -Continue CPAP nightly  Hyperlipidemia -Continue home statins as tolerated  HypertensionBP 94/49  Pulse 77 In the setting of PE  -Blood pressure medications initially held -Patient since tolerated low dose coreg and low dose entresto   Discharge Diagnoses:  Principal Problem:   Pulmonary embolism (HCC) Active Problems:   Elevated lipids   Essential hypertension   GERD   OVERACTIVE BLADDER   Sleep apnea   Syncope   Bilateral pulmonary embolism (HCC)    Discharge Instructions   Allergies as of 11/15/2017      Reactions   Codeine Nausea And Vomiting, Other (See Comments)   REACTION: dizzy- but can tolerate hydrocodone.    Levofloxacin Other (See Comments)   REACTION: itching   Neospect [dyphylline-ephed-pb-gg Or] Other (See Comments)   unknown   Spironolactone    Hyperkalemia   Tramadol    Didn't help with pain  Neosporin [neomycin-bacitracin Zn-polymyx] Rash      Medication List    STOP taking these medications   spironolactone 25 MG  tablet Commonly known as:  ALDACTONE   torsemide 20 MG tablet Commonly known as:  DEMADEX     TAKE these medications   camphor-phenol 10.8-4.7 % Liqd Commonly known as:  CAMPHO-PHENIQUE Apply 1 application topically daily as needed for mild pain or itching.   carvedilol 3.125 MG tablet Commonly known as:  COREG Take 1 tablet (3.125 mg total) by mouth 2 (two) times daily with a meal.   cefdinir 300 MG capsule Commonly known as:  OMNICEF Take 1 capsule (300 mg total) by mouth daily. Start taking on:  11/16/2017   cholecalciferol 1000 units tablet Commonly known as:  VITAMIN D Take 1,000 Units by mouth daily.   diclofenac sodium 1 % Gel Commonly known as:  VOLTAREN Apply 2 g 4 (four) times daily topically. On the left knee for pain What changed:    when to take this  reasons to take this  additional instructions   ELIQUIS STARTER PACK 5 MG Tabs Take as directed on package: start with two-5mg  tablets twice daily for 7 days. On day 8, switch to one-5mg  tablet twice daily.   apixaban 5 MG Tabs tablet Commonly known as:  ELIQUIS Take 1 tablet (5 mg total) by mouth 2 (two) times daily. Start taking on:  11/22/2017   ferrous sulfate 325 (65 FE) MG tablet Take 1 tablet (325 mg total) by mouth daily with breakfast.   fluticasone 50 MCG/ACT nasal spray Commonly known as:  FLONASE Place 2 sprays into both nostrils as needed for allergies or rhinitis.   pravastatin 40 MG tablet Commonly known as:  PRAVACHOL Take 1 tablet (40 mg total) by mouth every evening.   sacubitril-valsartan 24-26 MG Commonly known as:  ENTRESTO Take 1 tablet by mouth 2 (two) times daily. What changed:  Another medication with the same name was removed. Continue taking this medication, and follow the directions you see here.   SYSTANE BALANCE OP Apply 1 drop to eye as needed (dry eyes).   TYLENOL 500 MG tablet Generic drug:  acetaminophen Take 1,000 mg by mouth 2 (two) times daily.             Durable Medical Equipment  (From admission, onward)        Start     Ordered   11/15/17 1451  For home use only DME oxygen  Once    Question Answer Comment  Mode or (Route) Nasal cannula   Liters per Minute 2   Frequency Continuous (stationary and portable oxygen unit needed)   Oxygen delivery system Gas      11/15/17 1450     Follow-up Information    Joaquim Nam, MD. Schedule an appointment as soon as possible for a visit in 1 week(s).   Specialty:  Family Medicine Contact information: [address] [place] [address] [phone]        Pricilla Riffle, MD. Schedule an appointment as soon as possible for a visit.   Specialty:  Cardiology Contact information: [address] [place] [address] [phone]          Allergies  Allergen Reactions  . Codeine Nausea And Vomiting and Other (See Comments)    REACTION: dizzy- but can tolerate hydrocodone.   . Levofloxacin Other (See Comments)    REACTION: itching  . Neospect [Dyphylline-Ephed-Pb-Gg Or] Other (See  Comments)    unknown  . Spironolactone     Hyperkalemia   . Tramadol     Didn't help with pain  . Neosporin [Neomycin-Bacitracin Zn-Polymyx] Rash    Consultations:  Cardiology  PCCM  Procedures/Studies: Dg Chest 2 View  Result Date: 11/13/2017 CLINICAL DATA:  Hypoxia following recent fall EXAM: CHEST - 2 VIEW COMPARISON:  05/18/2013 FINDINGS: Cardiac shadow is stable. Large hiatal hernia is again seen. Stable aortic calcifications are noted. No focal infiltrate or sizable effusion is seen. No acute bony abnormality is noted. Midthoracic compression deformity is noted and stable. IMPRESSION: No acute abnormality noted. Chronic changes as described. Electronically Signed   By: Alcide Clever M.D.   On: 11/13/2017 11:25   Ct Head Wo Contrast  Result Date: 11/13/2017 CLINICAL DATA:  Tripped over rug hitting left side of face, now with left orbital hematoma. EXAM:  CT HEAD WITHOUT CONTRAST CT MAXILLOFACIAL WITHOUT CONTRAST CT CERVICAL SPINE WITHOUT CONTRAST TECHNIQUE: Multidetector CT imaging of the head, cervical spine, and maxillofacial structures were performed using the standard protocol without intravenous contrast. Multiplanar CT image reconstructions of the cervical spine and maxillofacial structures were also generated. COMPARISON:  None. FINDINGS: CT HEAD FINDINGS Brain: Mild, likely age-appropriate atrophy with sulcal prominence. Scattered periventricular hypodensities compatible microvascular ischemic disease. No CT evidence superimposed acute large territory infarct. No intraparenchymal or extra-axial mass or hemorrhage. Mild asymmetric prominence of the occipital horn of the left lateral ventricle without associated midline shift. Vascular: Intracranial atherosclerosis. Skull: Soft tissue swelling about the left orbit without associated fracture or radiopaque foreign body. Other: Limited visualization the paranasal sinuses is normal. No air-fluid levels. Post bilateral cataract surgery. CT MAXILLOFACIAL FINDINGS Osseous: No displaced calvarial or facial fracture. No displaced nasal bone fracture. Normal appearance of the bilateral pterygoid plates. The bilateral mandibular condyles are normally located. Normal appearance of the bilateral zygomatic arches. Orbits: Post bilateral cataract surgery. Otherwise, normal appearance the bilateral globes. The left-sided periorbital hematoma extends to involve the anterior aspect of the orbit, however there is no retro bulbar hematoma. Sinuses: Paranasal sinuses are normally aerated. No air-fluid levels. Minimal leftward nasal septal deviation. Small amount of debris is seen within the bilateral external auditory canals, left greater than right. Soft tissues: Large left-sided periorbital hematoma with dominant component measuring at least 6.0 x 1.3 cm (image 72, series 8). CT CERVICAL SPINE FINDINGS Alignment: C1 to the  superior endplate of T2 is imaged. Normal alignment of the cervical spine. No anterolisthesis or retrolisthesis. The bilateral facets are normally aligned. The dens is normally positioned between the lateral masses of C1. Normal atlantodental and atlantoaxial articulations. Skull base and vertebrae: Cervical vertebral body heights appear preserved. Soft tissues and spinal canal: Prevertebral soft tissues appear normal. Disc levels: Mild multilevel cervical spine DDD, worse at C5-C6 with disc space height loss, endplate irregularity and small posteriorly directed disc osteophyte complex at this location. Upper chest: Limited visualization of lung apices is normal. Other: Regional soft tissues appear normal. No bulky cervical lymphadenopathy on this noncontrast examination. Minimal amount of calcified atherosclerotic plaque within the bilateral carotid bulbs, left greater than right. Note is made of an approximately 1.1 x 0.7 cm hypoattenuating nodule within the right-sided the thyroid isthmus (image 85, series 17), of doubtful clinical concern. IMPRESSION: 1. Soft tissue swelling about the left orbit without associated displaced calvarial or facial fracture, radiopaque foreign body or acute intracranial process. 2. No displaced fracture or static subluxation of the cervical spine. 3. Mild likely age-appropriate atrophy  and microvascular ischemic disease. 4. Mild multilevel cervical spine DDD, worse at C5-C6. Electronically Signed   By: Simonne Come M.D.   On: 11/13/2017 11:16   Ct Angio Chest Pe W/cm &/or Wo Cm  Result Date: 11/13/2017 CLINICAL DATA:  Patient fell this morning with elevated troponin. EXAM: CT ANGIOGRAPHY CHEST WITH CONTRAST TECHNIQUE: Multidetector CT imaging of the chest was performed using the standard protocol during bolus administration of intravenous contrast. Multiplanar CT image reconstructions and MIPs were obtained to evaluate the vascular anatomy. CONTRAST:  80mL ISOVUE-370 IOPAMIDOL  (ISOVUE-370) INJECTION 76% COMPARISON:  07/20/2008 FINDINGS: Cardiovascular: Satisfactory opacification of pulmonary arteries. Bilateral central pulmonary emboli to all lobes starting at the branch point of the right and left main pulmonary arteries. Right heart strain with RV/LV ratio 1.38. Conventional branch pattern of the great vessels with atherosclerosis. Moderate aortic atherosclerosis without aneurysm. Left main and three-vessel coronary arteriosclerosis. Mediastinum/Nodes: Moderate-sized hiatal hernia. No lymphadenopathy. Trachea and esophagus are unremarkable. No thyromegaly or mass. Lungs/Pleura: No acute pulmonary consolidation, effusion or pneumothorax. Tiny subpleural nodular opacities in the left upper lobe anteriorly may reflect minimal areas of bronchiolitis. Bibasilar subsegmental atelectasis is noted. 5 mm right middle lobe nodule is noted, series 7/49. A few areas of bronchial calcifications are identified peripherally within the right middle lobe adjacent to this nodular opacity. Upper Abdomen: No acute abnormality. Small calcification along the capsule of the left upper pole of the kidney. Musculoskeletal: Mild degenerative change along the thoracic spine with chronic appearing mild central compression of T4, anterior height loss of T6 and vertebral hemangioma of T9. Review of the MIP images confirms the above findings. IMPRESSION: 1. Positive for acute PE with CT evidence of right heart strain (RV/LV Ratio = 1.36) consistent with at least submassive (intermediate risk) PE. The presence of right heart strain has been associated with an increased risk of morbidity and mortality. Please activate Code PE by paging [phone]. These results were called by telephone at the time of interpretation on 11/13/2017 at 1:35 pm to Dr. Pricilla Loveless , who verbally acknowledged these results. 2. 5 mm right middle lobe nodule. No follow-up needed if patient is low-risk. Non-contrast chest CT can be  considered in 12 months if patient is high-risk. This recommendation follows the consensus statement: Guidelines for Management of Incidental Pulmonary Nodules Detected on CT Images: From the Fleischner Society 2017; Radiology 2017; 284:228-243. 3. Coronary arteriosclerosis and aortic atherosclerosis. Aortic Atherosclerosis (ICD10-I70.0). Electronically Signed   By: Tollie Eth M.D.   On: 11/13/2017 13:37   Ct Cervical Spine Wo Contrast  Result Date: 11/13/2017 CLINICAL DATA:  Tripped over rug hitting left side of face, now with left orbital hematoma. EXAM: CT HEAD WITHOUT CONTRAST CT MAXILLOFACIAL WITHOUT CONTRAST CT CERVICAL SPINE WITHOUT CONTRAST TECHNIQUE: Multidetector CT imaging of the head, cervical spine, and maxillofacial structures were performed using the standard protocol without intravenous contrast. Multiplanar CT image reconstructions of the cervical spine and maxillofacial structures were also generated. COMPARISON:  None. FINDINGS: CT HEAD FINDINGS Brain: Mild, likely age-appropriate atrophy with sulcal prominence. Scattered periventricular hypodensities compatible microvascular ischemic disease. No CT evidence superimposed acute large territory infarct. No intraparenchymal or extra-axial mass or hemorrhage. Mild asymmetric prominence of the occipital horn of the left lateral ventricle without associated midline shift. Vascular: Intracranial atherosclerosis. Skull: Soft tissue swelling about the left orbit without associated fracture or radiopaque foreign body. Other: Limited visualization the paranasal sinuses is normal. No air-fluid levels. Post bilateral cataract surgery. CT MAXILLOFACIAL FINDINGS Osseous: No displaced  calvarial or facial fracture. No displaced nasal bone fracture. Normal appearance of the bilateral pterygoid plates. The bilateral mandibular condyles are normally located. Normal appearance of the bilateral zygomatic arches. Orbits: Post bilateral cataract surgery. Otherwise,  normal appearance the bilateral globes. The left-sided periorbital hematoma extends to involve the anterior aspect of the orbit, however there is no retro bulbar hematoma. Sinuses: Paranasal sinuses are normally aerated. No air-fluid levels. Minimal leftward nasal septal deviation. Small amount of debris is seen within the bilateral external auditory canals, left greater than right. Soft tissues: Large left-sided periorbital hematoma with dominant component measuring at least 6.0 x 1.3 cm (image 72, series 8). CT CERVICAL SPINE FINDINGS Alignment: C1 to the superior endplate of T2 is imaged. Normal alignment of the cervical spine. No anterolisthesis or retrolisthesis. The bilateral facets are normally aligned. The dens is normally positioned between the lateral masses of C1. Normal atlantodental and atlantoaxial articulations. Skull base and vertebrae: Cervical vertebral body heights appear preserved. Soft tissues and spinal canal: Prevertebral soft tissues appear normal. Disc levels: Mild multilevel cervical spine DDD, worse at C5-C6 with disc space height loss, endplate irregularity and small posteriorly directed disc osteophyte complex at this location. Upper chest: Limited visualization of lung apices is normal. Other: Regional soft tissues appear normal. No bulky cervical lymphadenopathy on this noncontrast examination. Minimal amount of calcified atherosclerotic plaque within the bilateral carotid bulbs, left greater than right. Note is made of an approximately 1.1 x 0.7 cm hypoattenuating nodule within the right-sided the thyroid isthmus (image 85, series 17), of doubtful clinical concern. IMPRESSION: 1. Soft tissue swelling about the left orbit without associated displaced calvarial or facial fracture, radiopaque foreign body or acute intracranial process. 2. No displaced fracture or static subluxation of the cervical spine. 3. Mild likely age-appropriate atrophy and microvascular ischemic disease. 4. Mild  multilevel cervical spine DDD, worse at C5-C6. Electronically Signed   By: Simonne Come M.D.   On: 11/13/2017 11:16   Ct Maxillofacial Wo Cm  Result Date: 11/13/2017 CLINICAL DATA:  Tripped over rug hitting left side of face, now with left orbital hematoma. EXAM: CT HEAD WITHOUT CONTRAST CT MAXILLOFACIAL WITHOUT CONTRAST CT CERVICAL SPINE WITHOUT CONTRAST TECHNIQUE: Multidetector CT imaging of the head, cervical spine, and maxillofacial structures were performed using the standard protocol without intravenous contrast. Multiplanar CT image reconstructions of the cervical spine and maxillofacial structures were also generated. COMPARISON:  None. FINDINGS: CT HEAD FINDINGS Brain: Mild, likely age-appropriate atrophy with sulcal prominence. Scattered periventricular hypodensities compatible microvascular ischemic disease. No CT evidence superimposed acute large territory infarct. No intraparenchymal or extra-axial mass or hemorrhage. Mild asymmetric prominence of the occipital horn of the left lateral ventricle without associated midline shift. Vascular: Intracranial atherosclerosis. Skull: Soft tissue swelling about the left orbit without associated fracture or radiopaque foreign body. Other: Limited visualization the paranasal sinuses is normal. No air-fluid levels. Post bilateral cataract surgery. CT MAXILLOFACIAL FINDINGS Osseous: No displaced calvarial or facial fracture. No displaced nasal bone fracture. Normal appearance of the bilateral pterygoid plates. The bilateral mandibular condyles are normally located. Normal appearance of the bilateral zygomatic arches. Orbits: Post bilateral cataract surgery. Otherwise, normal appearance the bilateral globes. The left-sided periorbital hematoma extends to involve the anterior aspect of the orbit, however there is no retro bulbar hematoma. Sinuses: Paranasal sinuses are normally aerated. No air-fluid levels. Minimal leftward nasal septal deviation. Small amount of  debris is seen within the bilateral external auditory canals, left greater than right. Soft tissues: Large left-sided periorbital  hematoma with dominant component measuring at least 6.0 x 1.3 cm (image 72, series 8). CT CERVICAL SPINE FINDINGS Alignment: C1 to the superior endplate of T2 is imaged. Normal alignment of the cervical spine. No anterolisthesis or retrolisthesis. The bilateral facets are normally aligned. The dens is normally positioned between the lateral masses of C1. Normal atlantodental and atlantoaxial articulations. Skull base and vertebrae: Cervical vertebral body heights appear preserved. Soft tissues and spinal canal: Prevertebral soft tissues appear normal. Disc levels: Mild multilevel cervical spine DDD, worse at C5-C6 with disc space height loss, endplate irregularity and small posteriorly directed disc osteophyte complex at this location. Upper chest: Limited visualization of lung apices is normal. Other: Regional soft tissues appear normal. No bulky cervical lymphadenopathy on this noncontrast examination. Minimal amount of calcified atherosclerotic plaque within the bilateral carotid bulbs, left greater than right. Note is made of an approximately 1.1 x 0.7 cm hypoattenuating nodule within the right-sided the thyroid isthmus (image 85, series 17), of doubtful clinical concern. IMPRESSION: 1. Soft tissue swelling about the left orbit without associated displaced calvarial or facial fracture, radiopaque foreign body or acute intracranial process. 2. No displaced fracture or static subluxation of the cervical spine. 3. Mild likely age-appropriate atrophy and microvascular ischemic disease. 4. Mild multilevel cervical spine DDD, worse at C5-C6. Electronically Signed   By: Simonne Come M.D.   On: 11/13/2017 11:16    Subjective: Eager to be discharged today  Discharge Exam: Vitals:   11/15/17 1107 11/15/17 1544  BP: (!) 95/53 115/61  Pulse: 66 67  Resp: 20 20  Temp: 98 F (36.7 C) 98  F (36.7 C)  SpO2: 98% 96%   Vitals:   11/15/17 0834 11/15/17 0900 11/15/17 1107 11/15/17 1544  BP:   (!) 95/53 115/61  Pulse: 66 69 66 67  Resp: 16 (!) 26 20 20   Temp:   98 F (36.7 C) 98 F (36.7 C)  TempSrc:   Oral Oral  SpO2: 99% 92% 98% 96%  Weight:      Height:        General: Pt is alert, awake, not in acute distress Cardiovascular: RRR, S1/S2 +, no rubs, no gallops Respiratory: CTA bilaterally, no wheezing, no rhonchi Abdominal: Soft, NT, ND, bowel sounds + Extremities: no edema, no cyanosis   The results of significant diagnostics from this hospitalization (including imaging, microbiology, ancillary and laboratory) are listed below for reference.     Microbiology: Recent Results (from the past 240 hour(s))  MRSA PCR Screening     Status: None   Collection Time: 11/13/17  5:05 PM  Result Value Ref Range Status   MRSA by PCR NEGATIVE NEGATIVE Final    Comment:        The GeneXpert MRSA Assay (FDA approved for NASAL specimens only), is one component of a comprehensive MRSA colonization surveillance program. It is not intended to diagnose MRSA infection nor to guide or monitor treatment for MRSA infections. Performed at [facility], [address], [place], [place] [address]      Labs: BNP (last 3 results) Recent Labs    11/13/17 1036  BNP 180.4*   Basic Metabolic Panel: Recent Labs  Lab 11/13/17 1036 11/14/17 0243 11/15/17 0232  NA 140 139 139  K 4.5 4.2 4.6  CL 107 108 107  CO2 23 23 25   GLUCOSE 149* 100* 96  BUN 26* 23* 22*  CREATININE 1.34* 1.27* 1.32*  CALCIUM 8.9 8.6* 8.8*   Liver Function Tests: No  results for input(s): AST, ALT, ALKPHOS, BILITOT, PROT, ALBUMIN in the last 168 hours. No results for input(s): LIPASE, AMYLASE in the last 168 hours. No results for input(s): AMMONIA in the last 168 hours. CBC: Recent Labs  Lab 11/13/17 1036 11/13/17 2057 11/14/17 0243 11/15/17 0232  WBC 14.8* 9.8 9.6 9.4  NEUTROABS 11.5*   --   --   --   HGB 13.2 12.9 11.9* 11.9*  HCT 42.7 42.3 38.6 39.4  MCV 87.9 90.4 88.3 90.6  PLT 183 163 152 152   Cardiac Enzymes: Recent Labs  Lab 11/13/17 1036  TROPONINI 0.07*   BNP: Invalid input(s): POCBNP CBG: No results for input(s): GLUCAP in the last 168 hours. D-Dimer No results for input(s): DDIMER in the last 72 hours. Hgb A1c No results for input(s): HGBA1C in the last 72 hours. Lipid Profile No results for input(s): CHOL, HDL, LDLCALC, TRIG, CHOLHDL, LDLDIRECT in the last 72 hours. Thyroid function studies No results for input(s): TSH, T4TOTAL, T3FREE, THYROIDAB in the last 72 hours.  Invalid input(s): FREET3 Anemia work up No results for input(s): VITAMINB12, FOLATE, FERRITIN, TIBC, IRON, RETICCTPCT in the last 72 hours. Urinalysis    Component Value Date/Time   COLORURINE YELLOW 11/15/2017 0215   APPEARANCEUR HAZY (A) 11/15/2017 0215   LABSPEC 1.021 11/15/2017 0215   PHURINE 5.0 11/15/2017 0215   GLUCOSEU NEGATIVE 11/15/2017 0215   HGBUR SMALL (A) 11/15/2017 0215   BILIRUBINUR NEGATIVE 11/15/2017 0215   BILIRUBINUR Neg 06/18/2017 1627   KETONESUR NEGATIVE 11/15/2017 0215   PROTEINUR NEGATIVE 11/15/2017 0215   UROBILINOGEN 0.2 06/18/2017 1627   UROBILINOGEN 1.0 05/18/2013 1321   NITRITE NEGATIVE 11/15/2017 0215   LEUKOCYTESUR MODERATE (A) 11/15/2017 0215   Sepsis Labs Invalid input(s): PROCALCITONIN,  WBC,  LACTICIDVEN Microbiology Recent Results (from the past 240 hour(s))  MRSA PCR Screening     Status: None   Collection Time: 11/13/17  5:05 PM  Result Value Ref Range Status   MRSA by PCR NEGATIVE NEGATIVE Final    Comment:        The GeneXpert MRSA Assay (FDA approved for NASAL specimens only), is one component of a comprehensive MRSA colonization surveillance program. It is not intended to diagnose MRSA infection nor to guide or monitor treatment for MRSA infections. Performed at [facility], [address], [place],  [place] [address]    Time spent:  SIGNED:   Rickey Barbara, MD  [facility] 11/15/2017, 4:30 PM  If 7PM-7AM, please contact night-coverage [email or web address] Password [email or web address]

## 2017-11-15 NOTE — Progress Notes (Signed)
Removed PIV access and changed dressing on Lt. Side of face. Pt's daughter received discharge instructions with prescriptions. She understood well and portable O2 and temporary O2 tank were delivered to room. Pt's daughter took pt's belongings. HS [employer]

## 2017-11-15 NOTE — Evaluation (Signed)
Physical Therapy Evaluation Patient Details Name: [name] MRN: [id number] DOB: 25-Jun-1923 Today's Date: 11/15/2017   History of Present Illness  Pt adm after presyncope/syncope fall. Pt  found to have Bil PE. PMH - arthritis, heart failure, sleep apnea  Clinical Impression  Pt presents to PT with steady gait with rollator and good safety awareness. Did demonstrate desaturation on RA (80%). From PT standpoint pt can return to independent living at [facility] with PT follow up. Since pt currently requiring supplemental O2 for activity did inform pt that she will have to be aware of O2 tubing and make sure she doesn't get tangled up in it. Pt verbalized understanding.    Follow Up Recommendations Home health PT(at [facility])    Equipment Recommendations  None recommended by PT    Recommendations for Other Services       Precautions / Restrictions Precautions Precautions: Fall Restrictions Weight Bearing Restrictions: No      Mobility  Bed Mobility Overal bed mobility: Modified Independent             General bed mobility comments: Incr time  Transfers Overall transfer level: Modified independent Equipment used: 4-wheeled walker                Ambulation/Gait Ambulation/Gait assistance: Modified independent (Device/Increase time) Ambulation Distance (Feet): 250 Feet(one sitting rest break) Assistive device: 4-wheeled walker Gait Pattern/deviations: Step-through pattern;Decreased stride length   Gait velocity interpretation: >2.62 ft/sec, indicative of community ambulatory General Gait Details: Steady gait with rollator. Initially amb on RA with SpO2 to 80%. Amb on 2L with SpO2 at 90%  Stairs            Wheelchair Mobility    Modified Rankin (Stroke Patients Only)       Balance Overall balance assessment: Needs assistance Sitting-balance support: No upper extremity supported;Feet supported Sitting balance-Leahy Scale:  Normal     Standing balance support: No upper extremity supported;During functional activity Standing balance-Leahy Scale: Fair                               Pertinent Vitals/Pain Pain Assessment: No/denies pain    Home Living Family/patient expects to be discharged to:: Other (Comment)(Independent living at [facility])                 Additional Comments: Has rollator    Prior Function Level of Independence: Needs assistance   Gait / Transfers Assistance Needed: Amb independently with rollator. Amb to dining room. 2 falls in 6 months related to syncope/presyncope           Hand Dominance        Extremity/Trunk Assessment   Upper Extremity Assessment Upper Extremity Assessment: Defer to OT evaluation    Lower Extremity Assessment Lower Extremity Assessment: Overall WFL for tasks assessed       Communication   Communication: No difficulties  Cognition Arousal/Alertness: Awake/alert Behavior During Therapy: WFL for tasks assessed/performed Overall Cognitive Status: Within Functional Limits for tasks assessed                                        General Comments      Exercises     Assessment/Plan    PT Assessment All further PT needs can be met in the next venue of care  PT Problem List Decreased activity  tolerance;Decreased balance;Decreased mobility;Cardiopulmonary status limiting activity       PT Treatment Interventions      PT Goals (Current goals can be found in the Care Plan section)  Acute Rehab PT Goals PT Goal Formulation: All assessment and education complete, DC therapy    Frequency     Barriers to discharge        Co-evaluation               AM-PAC PT "6 Clicks" Daily Activity  Outcome Measure Difficulty turning over in bed (including adjusting bedclothes, sheets and blankets)?: None Difficulty moving from lying on back to sitting on the side of the bed? : A Little Difficulty sitting  down on and standing up from a chair with arms (e.g., wheelchair, bedside commode, etc,.)?: A Little Help needed moving to and from a bed to chair (including a wheelchair)?: None Help needed walking in hospital room?: None Help needed climbing 3-5 steps with a railing? : A Little 6 Click Score: 21    End of Session Equipment Utilized During Treatment: Gait belt;Oxygen Activity Tolerance: Patient tolerated treatment well Patient left: in chair;with call bell/phone within reach;with family/visitor present   PT Visit Diagnosis: Other abnormalities of gait and mobility (R26.89)    Time: 3267-1245 PT Time Calculation (min) (ACUTE ONLY): 37 min   Charges:   PT Evaluation $PT Eval Moderate Complexity: 1 Mod PT Treatments $Gait Training: 8-22 mins   PT G Codes:        [facility] PT [place] 11/15/2017, 10:20 AM

## 2017-11-15 NOTE — Progress Notes (Signed)
SATURATION QUALIFICATIONS: (This note is used to comply with regulatory documentation for home oxygen)  Patient Saturations on Room Air at Rest = 93%  Patient Saturations on Room Air while Ambulating = 80%  Patient Saturations on 2 Liters of oxygen while Ambulating = 90%  Please briefly explain why patient needs home oxygen:Unable to maintain adequate SpO2 without supplemental O2.   [employer] PT [phone]

## 2017-11-15 NOTE — Care Management Note (Signed)
Case Management Note  Patient Details  Name: [name] MRN: [id number] Date of Birth: 14-Sep-1923  Subjective/Objective:      Pt from [facility] and has used their contracted therapy services for PT since January.  Pt and family would like to continue with the same therapy service.  Pt's daughter supplied number for [name], therapy coordinator at [facility] [phone]).    Pt is new on Eliquis.  Pt was on previously but not PTA.  RN to ensure a O2 saturation note is entered as patient will need DME O2. Pt and family agree to [facility] to supply O2.  Action/Plan: Left message for [name] to return call to me today to set up therapy services.    Eliquis cards given and explained.  Expected Discharge Date:                  Expected Discharge Plan:  Home w Home Health Services  In-House Referral:  NA  Discharge planning Services  CM Consult, Medication Assistance  Post Acute Care Choice:  Home Health Choice offered to:  Adult Children, Patient  DME Arranged:    DME Agency:     HH Arranged:  PT, OT, RN HH Agency:  ([facility]- [facility])  Status of Service:  In process, will continue to follow  If discussed at Long Length of Stay Meetings, dates discussed:    Additional Comments:  Update: 1645 O2 order entered.  [name] with [facility] notified and will deliver portable tank to room.  HH orders printed and given to family and faxed to [facility] at [phone].  Joann Furlong, RN 11/15/2017, 4:09 PM

## 2017-11-15 NOTE — Progress Notes (Addendum)
ANTICOAGULATION CONSULT NOTE - Follow up Consult  Pharmacy Consult for heparin Indication: pulmonary embolus  Allergies  Allergen Reactions  . Codeine Nausea And Vomiting and Other (See Comments)    REACTION: dizzy- but can tolerate hydrocodone.   . Levofloxacin Other (See Comments)    REACTION: itching  . Neospect [Dyphylline-Ephed-Pb-Gg Or] Other (See Comments)    unknown  . Spironolactone     Hyperkalemia   . Tramadol     Didn't help with pain  . Neosporin [Neomycin-Bacitracin Zn-Polymyx] Rash    Patient Measurements: Height:  (157.5 cm) Weight: 153 lb 11.2 oz (69.7 kg) IBW/kg (Calculated) : 50.1 Heparin Dosing Weight: 65kg  Vital Signs: Temp: 98 F (36.7 C) (05/26 0348) Temp Source: Oral (05/26 0348) BP: 114/56 (05/26 0348) Pulse Rate: 62 (05/26 0348)  Labs: Recent Labs    11/13/17 1036 11/13/17 2057  11/14/17 0243 11/14/17 1404 11/14/17 2246 11/15/17 0232  HGB 13.2 12.9  --  11.9*  --   --  11.9*  HCT 42.7 42.3  --  38.6  --   --  39.4  PLT 183 163  --  152  --   --  152  HEPARINUNFRC  --   --    < > 1.10* 0.79* 0.50 0.44  CREATININE 1.34*  --   --  1.27*  --   --  1.32*  TROPONINI 0.07*  --   --   --   --   --   --    < > = values in this interval not displayed.    Estimated Creatinine Clearance: 24.9 mL/min (A) (by C-G formula based on SCr of 1.32 mg/dL (H)).   Medical History: Past Medical History:  Diagnosis Date  . AK (actinic keratosis)   . Allergy   . Chronic dermatitis    face, allergic to many products  . GERD (gastroesophageal reflux disease)   . Hyperlipidemia   . Hypertension   . Kidney stones   . Overactive bladder   . Sleep apnea   . Syncope     Medications:  Infusions:  . heparin 800 Units/hr (11/15/17 0300)    Assessment: [name] presented s/p fall found to have a PE with right heart strain and started on IV heparin. Pt also noted to have R DVT on dopplers. Heparin level therapeutic this morning, CBC stable. Pt  likely to transition to DOAC soon.   Goal of Therapy:  Heparin level 0.3-0.7 units/ml Monitor platelets by anticoagulation protocol: Yes   Plan:  Continue heparin 800 units/hr Daily heparin level, CBC   ADDENDUM: Transitioning to apixaban for acute VTE treatment. CrCl ~26ml/min - will need to watch SCr closely  Plan: Stop IV heparin Apixaban  BID x7 days then  BID  [place], PharmD, BCPS PGY-2 Cardiology Pharmacy Resident Pager: [phone] 11/15/2017

## 2017-11-15 NOTE — Evaluation (Signed)
Occupational Therapy Evaluation Patient Details Name: [name] MRN: [id number] DOB: Nov 15, 1923 Today's Date: 11/15/2017    History of Present Illness Pt adm after presyncope/syncope fall. Pt found to have Bil PE and BLE DVT. PMH - arthritis, heart failure, sleep apnea   Clinical Impression   PTA, pt was independent with rollator for basic ADL and functional mobility. She lives in independent living at [facility] and has meals and cleaning provided. Pt currently able to complete ambulation in room for toilet transfers and standing grooming tasks with min assist without RW or rollator as pt up in bathroom on my arrival. Pt able to complete LB dressing tasks with supervision and toileting hygiene with min guard assist. She and her family report preference for return to [facility] and plan to have pt D/C home with daughter for a few days for increased assistance prior to returning to independent living. Feel this is an appropriate D/C plan and do not anticipate need for OT follow-up post-acute D/C. However, OT will continue to follow while admitted.    Follow Up Recommendations  No OT follow up;Supervision/Assistance - 24 hour    Equipment Recommendations  None recommended by OT    Recommendations for Other Services       Precautions / Restrictions Precautions Precautions: Fall Restrictions Weight Bearing Restrictions: No      Mobility Bed Mobility Overal bed mobility: Modified Independent                Transfers Overall transfer level: Needs assistance Equipment used: None Transfers: Sit to/from Stand Sit to Stand: Min assist         General transfer comment: Min assist for stability when not using walker    Balance Overall balance assessment: Needs assistance Sitting-balance support: No upper extremity supported;Feet supported Sitting balance-Leahy Scale: Normal     Standing balance support: No upper extremity supported;During functional activity Standing  balance-Leahy Scale: Fair Standing balance comment: able to statically stand without UE support                           ADL either performed or assessed with clinical judgement   ADL Overall ADL's : Needs assistance/impaired Eating/Feeding: Set up;Sitting   Grooming: Supervision/safety;Standing   Upper Body Bathing: Set up;Sitting   Lower Body Bathing: Supervison/ safety;Sit to/from stand   Upper Body Dressing : Set up;Sitting   Lower Body Dressing: Supervision/safety;Sit to/from stand   Toilet Transfer: Minimal assistance;Ambulation Toilet Transfer Details (indicate cue type and reason): Pt received in bathroom on commode without RW. She was able to ambulate to sink and back to her bed with min assist without RW.  Toileting- Architect and Hygiene: Min guard;Sit to/from stand       Functional mobility during ADLs: Minimal assistance General ADL Comments: Pt slightly unstable with decreased activity tolerance for ADL.      Vision Patient Visual Report: Other (comment)(R eye painful due to bruising in orbital region) Vision Assessment?: No apparent visual deficits     Perception     Praxis      Pertinent Vitals/Pain Pain Assessment: No/denies pain     Hand Dominance     Extremity/Trunk Assessment Upper Extremity Assessment Upper Extremity Assessment: Generalized weakness   Lower Extremity Assessment Lower Extremity Assessment: Overall WFL for tasks assessed       Communication Communication Communication: No difficulties   Cognition Arousal/Alertness: Awake/alert Behavior During Therapy: WFL for tasks assessed/performed Overall Cognitive Status: Within Functional  Limits for tasks assessed                                     General Comments       Exercises     Shoulder Instructions      Home Living Family/patient expects to be discharged to:: Other (Comment)                                  Additional Comments: Lives in Independent Living Facility at [facility]. She will be discharging home to her daughter's home for a few days prior to returning to ILF. Other daughter plans to stay with her at independent living facility for a few days once she returns there.       Prior Functioning/Environment Level of Independence: Needs assistance  Gait / Transfers Assistance Needed: Able to ambulate independently with rollator.  ADL's / Homemaking Assistance Needed: Has assistance for meals and cleaning but has been independent with basic ADL.             OT Problem List: Decreased activity tolerance;Impaired balance (sitting and/or standing);Decreased safety awareness;Decreased knowledge of use of DME or AE;Decreased knowledge of precautions;Cardiopulmonary status limiting activity      OT Treatment/Interventions: Self-care/ADL training;Therapeutic exercise;Energy conservation;DME and/or AE instruction;Therapeutic activities;Patient/family education;Balance training    OT Goals(Current goals can be found in the care plan section) Acute Rehab OT Goals Patient Stated Goal: feel better and go home OT Goal Formulation: With patient/family Time For Goal Achievement: 11/29/17 Potential to Achieve Goals: Good ADL Goals Pt Will Perform Grooming: with modified independence;standing Pt Will Perform Upper Body Dressing: with modified independence;sitting Pt Will Perform Lower Body Dressing: with modified independence;sit to/from stand Pt Will Transfer to Toilet: with modified independence;ambulating;regular height toilet Pt Will Perform Toileting - Clothing Manipulation and hygiene: with modified independence;sit to/from stand  OT Frequency: Min 2X/week   Barriers to D/C:            Co-evaluation              AM-PAC PT "6 Clicks" Daily Activity     Outcome Measure Help from another person eating meals?: None Help from another person taking care of personal grooming?: A  Little Help from another person toileting, which includes using toliet, bedpan, or urinal?: A Little Help from another person bathing (including washing, rinsing, drying)?: A Little Help from another person to put on and taking off regular upper body clothing?: A Little Help from another person to put on and taking off regular lower body clothing?: A Little 6 Click Score: 19   End of Session Equipment Utilized During Treatment: Gait belt;Rolling walker Nurse Communication: Mobility status  Activity Tolerance: Patient tolerated treatment well Patient left: with call bell/phone within reach;in bed;with bed alarm set  OT Visit Diagnosis: Other abnormalities of gait and mobility (R26.89);Muscle weakness (generalized) (M62.81)                Time: 1610-9604 OT Time Calculation (min): 16 min Charges:  OT General Charges $OT Visit: 1 Visit OT Evaluation $OT Eval Moderate Complexity: 1 Mod G-Codes:     Doristine Section, MS OTR/L  Pager: [phone]   Yogesh Cominsky A Sahvanna Mcmanigal 11/15/2017, 3:20 PM

## 2017-11-17 ENCOUNTER — Telehealth: Payer: Self-pay | Admitting: *Deleted

## 2017-11-17 DIAGNOSIS — G4733 Obstructive sleep apnea (adult) (pediatric): Secondary | ICD-10-CM | POA: Diagnosis not present

## 2017-11-17 DIAGNOSIS — I2699 Other pulmonary embolism without acute cor pulmonale: Secondary | ICD-10-CM | POA: Diagnosis not present

## 2017-11-17 NOTE — Telephone Encounter (Signed)
Lm requesting return call to complete TCM and confirm hosp f/u appt  

## 2017-11-17 NOTE — Telephone Encounter (Signed)
Transition Care Management Follow-up Telephone Call **completed by pts daughter   Date discharged? 11/15/2017   How have you been since you were released from the hospital? "doing much better"   Do you understand why you were in the hospital? yes   Do you understand the discharge instructions? yes   Where were you discharged to? home   Items Reviewed:  Medications reviewed: yes  Allergies reviewed: yes  Dietary changes reviewed: yes Referrals reviewed: yes; daughter is wanting info regarding home health, PT etc  Functional Questionnaire:   Activities of Daily Living (ADLs):   She states they are independent in the following: feeding States they require assistance with the following: needing and has been previously receiving help in all other areas   Any transportation issues/concerns?: no   Any patient concerns? Yes; home health orders   Confirmed importance and date/time of follow-up visits scheduled yes  Provider Appointment booked with Dr Para March 5/30  Confirmed with patient if condition begins to worsen call PCP or go to the ER.  Patient was given the office number and encouraged to call back with question or concerns.  : yes

## 2017-11-18 NOTE — Telephone Encounter (Signed)
Noted. Thanks.

## 2017-11-19 ENCOUNTER — Ambulatory Visit (INDEPENDENT_AMBULATORY_CARE_PROVIDER_SITE_OTHER): Payer: Medicare HMO | Admitting: Family Medicine

## 2017-11-19 ENCOUNTER — Encounter: Payer: Self-pay | Admitting: Family Medicine

## 2017-11-19 VITALS — BP 126/68 | HR 64 | Temp 98.2°F | Ht 62.0 in | Wt 158.2 lb

## 2017-11-19 DIAGNOSIS — I2609 Other pulmonary embolism with acute cor pulmonale: Secondary | ICD-10-CM | POA: Diagnosis not present

## 2017-11-19 LAB — CBC WITH DIFFERENTIAL/PLATELET
BASOS ABS: 0.1 10*3/uL (ref 0.0–0.1)
Basophils Relative: 1.1 % (ref 0.0–3.0)
Eosinophils Absolute: 0.5 10*3/uL (ref 0.0–0.7)
Eosinophils Relative: 6 % — ABNORMAL HIGH (ref 0.0–5.0)
HCT: 39.1 % (ref 36.0–46.0)
Hemoglobin: 12.6 g/dL (ref 12.0–15.0)
LYMPHS ABS: 1.7 10*3/uL (ref 0.7–4.0)
LYMPHS PCT: 22.6 % (ref 12.0–46.0)
MCHC: 32.3 g/dL (ref 30.0–36.0)
MCV: 87.4 fl (ref 78.0–100.0)
MONOS PCT: 9.2 % (ref 3.0–12.0)
Monocytes Absolute: 0.7 10*3/uL (ref 0.1–1.0)
NEUTROS PCT: 61.1 % (ref 43.0–77.0)
Neutro Abs: 4.7 10*3/uL (ref 1.4–7.7)
Platelets: 264 10*3/uL (ref 150.0–400.0)
RBC: 4.47 Mil/uL (ref 3.87–5.11)
RDW: 18.4 % — ABNORMAL HIGH (ref 11.5–15.5)
WBC: 7.7 10*3/uL (ref 4.0–10.5)

## 2017-11-19 LAB — BASIC METABOLIC PANEL
BUN: 18 mg/dL (ref 6–23)
CALCIUM: 9.4 mg/dL (ref 8.4–10.5)
CO2: 24 mEq/L (ref 19–32)
CREATININE: 1.02 mg/dL (ref 0.40–1.20)
Chloride: 106 mEq/L (ref 96–112)
GFR: 53.86 mL/min — AB (ref >60.00)
GLUCOSE: 106 mg/dL — AB (ref 70–99)
Potassium: 4.4 mEq/L (ref 3.5–5.1)
SODIUM: 139 meq/L (ref 135–145)

## 2017-11-19 NOTE — Patient Instructions (Signed)
Go to the lab on the way out.  We'll contact you with your lab report. Update me as needed.  Use oxygen with activity.  Take care.  Glad to see you.

## 2017-11-19 NOTE — Progress Notes (Signed)
Recommendations for Outpatient Follow-up:  1. Follow up with PCP in 1-2 weeks 2. Follow up with Cardiology as scheduled  Home Health:PT, RN  Equipment/Devices:home O2    Discharge Condition:Stable CODE STATUS:Partial Diet recommendation: Heart healthy   Brief/Interim Summary: 84 y.o.femalewith medical history significant forsleep apnea, systolic heart failure, pretension, hyperlipidemia, arthritis, GERD, who lives in assisted living facility, brought to the emergency department after feeling lightheaded while she was standing up, falling on her left side of her face and eyelid, and not losing consciousness. Since falling, she felt very short of breath. She denies any chest pain or palpitations. The patient denies any focal weakness. Of note, over the last few months, she has been feeling tired, and experience lower extremity swelling, mostly on the right, that she attributed to her arthritis. She denies any pleuritic chest pain palpitations or chest wall pain. She denies any fever, chills, night sweats or hemoptysis. She denies any other prior presyncopal or syncopal episodes. She denies any recent diagnosis of cancer. She denies any tobacco abuse. She is not on any hormonal replacement therapy. No recent long distance trips. She is fairly inactive. She denies any prior history of PE or DVT. She denies any significant amount of NSAIDs. She is on iron supplement at home. She has never been seen by a hematologist, or had any bowel marrow biopsy in the past. She denies any recent infections or sick contacts. No confusion is reported. She is not on aspirin daily  Pulmonary Embolism, Bilateral. PESI scoreat 8.9% risk of death.Syncope.. -CT Angioof the chest showed bilateral PE, with right heart strain..  -Echo with severe RV dysfunction.  -Seen by CCM. Not candidate for lytics -Lower extremity Dopplers reviewed, findings of bilateral lower extremity  DVT -Discussed case with patient's daughter. Family is adamant about returning to independent living at [facility] not interested in skilled nursing level or any other facility. Have placed homehealth orders for PT/OT/RN -Patient meets home O2 requirements. See PT note. Sats 80% on room air -Will discharge on eliquis  Syncopelikely due to above -Continue withfall precautions -Patient ambulatory and stable thus far  Chronic systolic and diastolic CHF -last 2D echo in June 2017 showed EF 30 to 35%, grade 1 diastolic, moderately to severely reduced systolic. -Coreg had been on hold at time of admission, resumed -Continue entresto as tolerated -Appears to be euvolemic at present  OSA -Continue CPAP nightly  Hyperlipidemia -Continue home statinsas tolerated  HypertensionBP 94/49  Pulse 77 In the setting of PE -Blood pressure medications initially held -Patient since tolerated low dose coreg and low dose entresto   Discharge Diagnoses:  Principal Problem:   Pulmonary embolism (HCC) Active Problems:   Elevated lipids   Essential hypertension   GERD   OVERACTIVE BLADDER   Sleep apnea   Syncope   Bilateral pulmonary embolism (HCC)    Discharge Instructions       Allergies as of 11/15/2017      Reactions   Codeine Nausea And Vomiting, Other (See Comments)   REACTION: dizzy- but can tolerate hydrocodone.    Levofloxacin Other (See Comments)   REACTION: itching   Neospect [dyphylline-ephed-pb-gg Or] Other (See Comments)   unknown   Spironolactone    Hyperkalemia   Tramadol    Didn't help with pain   Neosporin [neomycin-bacitracin Zn-polymyx] Rash                     Medication List  STOP taking these medications          spironolactone 25 MG tablet Commonly known as:  ALDACTONE   torsemide 20 MG tablet Commonly known as:  DEMADEX                   TAKE these medications          camphor-phenol  10.8-4.7 % Liqd Commonly known as:  CAMPHO-PHENIQUE Apply 1 application topically daily as needed for mild pain or itching.   carvedilol 3.125 MG tablet Commonly known as:  COREG Take 1 tablet (3.125 mg total) by mouth 2 (two) times daily with a meal.   cefdinir 300 MG capsule Commonly known as:  OMNICEF Take 1 capsule (300 mg total) by mouth daily. Start taking on:  11/16/2017   cholecalciferol 1000 units tablet Commonly known as:  VITAMIN D Take 1,000 Units by mouth daily.   diclofenac sodium 1 % Gel Commonly known as:  VOLTAREN Apply 2 g 4 (four) times daily topically. On the left knee for pain What changed:    when to take this  reasons to take this  additional instructions   ELIQUIS STARTER PACK 5 MG Tabs Take as directed on package: start with two-5mg  tablets twice daily for 7 days. On day 8, switch to one-5mg  tablet twice daily.   apixaban 5 MG Tabs tablet Commonly known as:  ELIQUIS Take 1 tablet (5 mg total) by mouth 2 (two) times daily. Start taking on:  11/22/2017   ferrous sulfate 325 (65 FE) MG tablet Take 1 tablet (325 mg total) by mouth daily with breakfast.   fluticasone 50 MCG/ACT nasal spray Commonly known as:  FLONASE Place 2 sprays into both nostrils as needed for allergies or rhinitis.   pravastatin 40 MG tablet Commonly known as:  PRAVACHOL Take 1 tablet (40 mg total) by mouth every evening.   sacubitril-valsartan 24-26 MG Commonly known as:  ENTRESTO Take 1 tablet by mouth 2 (two) times daily. What changed:  Another medication with the same name was removed. Continue taking this medication, and follow the directions you see here.   SYSTANE BALANCE OP Apply 1 drop to eye as needed (dry eyes).   TYLENOL 500 MG tablet Generic drug:  acetaminophen Take 1,000 mg by mouth 2 (two) times daily.   ==================================================== Inpatient follow-up.  Patient was off eliquis prev due to her fall risk.  She  previously had no history of DVT, PE, miscarriage.  She does not have any known active cancer.  No bleeding disorder otherwise known.  She is not exceptionally active at baseline.  She was at home when she fell.  She was transported to the emergency room.  She was found to have bilateral DVTs in the legs along with pulmonary embolus.  She likely had a urinary tract infection.  She was treated with anticoagulation and her UTI was treated.  She is finishing up antibiotics for the UTI today.  She is still on anticoagulation.  She did have left temporal laceration that required 2 sutures.  She had imaging that was negative for acute process otherwise, no facial fracture.  She is now back at home.  Family has been supervising her 24/7. She is ssing walker, has rails at home, has a fall button.  She hasn't been alone since discharge.  She is in independent living.  She has order from PT/OT/RN per discharge and family contacted [facility].  She is on O2 at this point via [place] with exertion  but not at rest.    In the meantime she is not having chest pain.  Not short of breath.  She does have some lower extremity edema.  She has resolving/migrating bruising on the left side of the face and upper neck.  PMH and SH reviewed  ROS: Per HPI unless specifically indicated in ROS section   Meds, vitals, and allergies reviewed.   GEN: nad, alert and oriented, in wheelchair.  Bruising noted on the left side of the face.  She does have 2 sutures that are noted but obscured by scab.  We tried to loosen this up with a warm compress but it appeared that removal of suture at this point would likely be more hazardous than remaining in for another day or so HEENT: mucous membranes moist NECK: supple w/o LA CV: rrr. PULM: ctab, no inc wob ABD: soft, +bs EXT: trace BLE edema SKIN: no acute rash but bruising noted.

## 2017-11-20 ENCOUNTER — Encounter: Payer: Self-pay | Admitting: Family Medicine

## 2017-11-20 ENCOUNTER — Encounter: Payer: Self-pay | Admitting: Internal Medicine

## 2017-11-20 ENCOUNTER — Ambulatory Visit: Payer: Medicare HMO | Admitting: Internal Medicine

## 2017-11-20 ENCOUNTER — Encounter (INDEPENDENT_AMBULATORY_CARE_PROVIDER_SITE_OTHER): Payer: Self-pay

## 2017-11-20 VITALS — BP 110/68 | HR 69 | Ht 62.0 in | Wt 159.8 lb

## 2017-11-20 DIAGNOSIS — R55 Syncope and collapse: Secondary | ICD-10-CM | POA: Diagnosis not present

## 2017-11-20 DIAGNOSIS — D509 Iron deficiency anemia, unspecified: Secondary | ICD-10-CM

## 2017-11-20 DIAGNOSIS — I2609 Other pulmonary embolism with acute cor pulmonale: Secondary | ICD-10-CM | POA: Diagnosis not present

## 2017-11-20 DIAGNOSIS — I5022 Chronic systolic (congestive) heart failure: Secondary | ICD-10-CM

## 2017-11-20 DIAGNOSIS — E782 Mixed hyperlipidemia: Secondary | ICD-10-CM

## 2017-11-20 NOTE — Addendum Note (Signed)
Addended by: Joaquim NamUNCAN, Rexford Prevo S on: 11/20/2017 08:37 AM   Modules accepted: Level of Service

## 2017-11-20 NOTE — Patient Instructions (Addendum)
Continue current meds   Keep follow up appointment already scheduled.

## 2017-11-20 NOTE — Assessment & Plan Note (Signed)
She does not have known cancer at this point.  Given her anticoagulation and recent chest CT and the PE, it is likely not worth the risk of putting the patient through an invasive work-up for occult malignancy.  Is more likely that she had pulmonary embolism related to DVTs due to being relatively sedentary.  Discussed deferring occult malignancy work-up and family agreed.  Patient agreed.  Continue anticoagulation for now.  She is been able to tolerate the medication so far.  We did not remove her sutures today.  Family is going to keep applying warm compresses to the scab so that the sutures can be more easily removed.  I will ask that cardiology remove the sutures at follow-up tomorrow.  Pathophysiology of DVT and PE discussed with patient.  Discussed rationale for treatment.  All questions answered.  Check routine labs today.  See notes on labs.  UTI apparently treated.  No residual symptoms.  Relatively deconditioned from hospital stay.  The goal is to have her use oxygen with exertion in the meantime with the hopes that she will gradually wean from oxygen as needed.  Continue with home health OT/PT given her baseline deconditioning from the hospital stay.  Next  At this point still okay for outpatient follow-up.  I appreciate the help of all involved.

## 2017-11-20 NOTE — Progress Notes (Signed)
Cardiology Office Note   Date:  11/20/2017   ID:  [name], DOB 10-Aug-1923, MRN [id number]  PCP:  Joaquim Nam, MD  Cardiologist:   Dietrich Pates, MD   F/U of  PE and chronic systolic CHF     History of Present Illness: [name] is a 84 y.o. female with a history of HTN, syncope, HL, OSA, systolic CHF LVEF 30 to 35%   I saw her in clinic in Feb 2019   She had been seen by Odessa Fleming before that visit   Taken off of Eliquis  Since no documented afib and also anemia   (hgb 8.5 in Feb)  Since seen she was just hosp with near syncope,fall  She did not lose consciousness   Hosp   Found to have bilateral PE  With RV strain   LE dopplers positive for bilateral LE DVT She was placed on Eliquis  She has been staying with daughter   Now going back to apartment Breathing is OK   No dizziness   NO CP   No palpittions       Current Meds  Medication Sig  . acetaminophen (TYLENOL) 500 MG tablet Take 1,000 mg by mouth 2 (two) times daily.   Melene Muller ON 11/22/2017] apixaban (ELIQUIS) 5 MG TABS tablet Take 1 tablet (5 mg total) by mouth 2 (two) times daily.  . camphor-phenol (CAMPHO-PHENIQUE) 10.8-4.7 % LIQD Apply 1 application topically daily as needed for mild pain or itching.  . carvedilol (COREG) 3.125 MG tablet Take 1 tablet (3.125 mg total) by mouth 2 (two) times daily with a meal.  . cefdinir (OMNICEF) 300 MG capsule Take 1 capsule (300 mg total) by mouth daily.  . cholecalciferol (VITAMIN D) 1000 UNITS tablet Take 1,000 Units by mouth daily.   . diclofenac sodium (VOLTAREN) 1 % GEL Apply 2 g 4 (four) times daily topically. On the left knee for pain (Patient taking differently: Apply 2 g topically daily as needed. On the left knee for pain)  . ELIQUIS STARTER PACK (ELIQUIS STARTER PACK) 5 MG TABS Take as directed on package: start with two-5mg  tablets twice daily for 7 days. On day 8, switch to one-5mg  tablet twice daily.  . ferrous sulfate 325 (65 FE) MG tablet Take 1 tablet (325 mg  total) by mouth daily with breakfast.  . fluticasone (FLONASE) 50 MCG/ACT nasal spray Place 2 sprays into both nostrils as needed for allergies or rhinitis.  . pravastatin (PRAVACHOL) 40 MG tablet Take 1 tablet (40 mg total) by mouth every evening.  Marland Kitchen Propylene Glycol (SYSTANE BALANCE OP) Apply 1 drop to eye as needed (dry eyes).  . sacubitril-valsartan (ENTRESTO) 24-26 MG Take 1 tablet by mouth 2 (two) times daily.     Allergies:   Codeine; Levofloxacin; Neospect [dyphylline-ephed-pb-gg or]; Spironolactone; Tramadol; and Neosporin [neomycin-bacitracin zn-polymyx]   Past Medical History:  Diagnosis Date  . AK (actinic keratosis)   . Allergy   . Chronic dermatitis    face, allergic to many products  . GERD (gastroesophageal reflux disease)   . Hyperlipidemia   . Hypertension   . Kidney stones   . Overactive bladder   . Pulmonary embolism (HCC)   . Sleep apnea   . Syncope     Past Surgical History:  Procedure Laterality Date  . broken nose    . COLONOSCOPY  2004     Social History:  The patient  reports that she has quit smoking. Her smoking  use included cigarettes. She has never used smokeless tobacco. She reports that she does not drink alcohol or use drugs.   Family History:  The patient's family history includes Cancer in her mother; Coronary artery disease in her father.    ROS:  Please see the history of present illness. All other systems are reviewed and  Negative to the above problem except as noted.    PHYSICAL EXAM: VS:  BP 110/68   Pulse 69   Ht  (1.575 m)   Wt 72.5 kg (159 lb 12.8 oz)   SpO2 95%   BMI 29.23 kg/m   GEN: Pt is overwt 84 yo in no acute distress   Examined in wheelchair HEENT:  Ecchymoses to face   Neck: no JVD, carotid bruits, or masses Cardiac: RRR; no murmurs, rubs, or gallops,Trivial edema  Respiratory:  clear to auscultation bilaterally, normal work of breathing GI: soft, nontender, nondistended, + BS  No hepatomegaly  MS: no  deformity Moving all extremities   Skin: warm and dry, no rash Neuro:  Strength and sensation are intact Psych: euthymic mood, full affect   EKG:  EKG is not ordered today.   Lipid Panel    Component Value Date/Time   CHOL 118 07/27/2017 1303   TRIG 109 07/27/2017 1303   HDL 54 07/27/2017 1303   CHOLHDL 2.2 07/27/2017 1303   CHOLHDL 3 05/09/2011 0955   VLDL 20.6 05/09/2011 0955   LDLCALC 42 07/27/2017 1303      Wt Readings from Last 3 Encounters:  11/20/17 72.5 kg (159 lb 12.8 oz)  11/19/17 71.8 kg (158 lb 4 oz)  11/15/17 69.7 kg (153 lb 11.2 oz)      ASSESSMENT AND PLAN:  1  Bilateral PE   Pt slowly recovering   SHould be on lifelong anticoaug unless she develops bleeding   WIll need to follow H/H closely  PT going home   Will speak to home health to see if she can get HH at home    2  Chronic systolic CHF   Volume status is not back   Will need to follow    2   ? Syncope  Recent spell was diffferent from prevous    I would folow closely on anticoagulation    3  CAD   Pt denies CP  4   HL  Continue statin      Pt has f/u appt early in fall   Sees IM before    Will speak to [facility] re Hgb.  Current medicines are reviewed at length with the patient today.  The patient does not have concerns regarding medicines.  Signed, Dietrich Pates, MD  11/20/2017 1:54 PM    [facility] [address] [place], [place], [place]  [address] Phone: [phone]; Fax: [phone]

## 2017-11-21 DIAGNOSIS — I2699 Other pulmonary embolism without acute cor pulmonale: Secondary | ICD-10-CM | POA: Diagnosis not present

## 2017-11-21 DIAGNOSIS — I700 Atherosclerosis of aorta: Secondary | ICD-10-CM | POA: Diagnosis not present

## 2017-11-21 DIAGNOSIS — I2510 Atherosclerotic heart disease of native coronary artery without angina pectoris: Secondary | ICD-10-CM | POA: Diagnosis not present

## 2017-11-21 DIAGNOSIS — E785 Hyperlipidemia, unspecified: Secondary | ICD-10-CM | POA: Diagnosis not present

## 2017-11-21 DIAGNOSIS — I5042 Chronic combined systolic (congestive) and diastolic (congestive) heart failure: Secondary | ICD-10-CM | POA: Diagnosis not present

## 2017-11-21 DIAGNOSIS — I361 Nonrheumatic tricuspid (valve) insufficiency: Secondary | ICD-10-CM | POA: Diagnosis not present

## 2017-11-21 DIAGNOSIS — M5090 Cervical disc disorder, unspecified, unspecified cervical region: Secondary | ICD-10-CM | POA: Diagnosis not present

## 2017-11-21 DIAGNOSIS — I110 Hypertensive heart disease with heart failure: Secondary | ICD-10-CM | POA: Diagnosis not present

## 2017-11-21 DIAGNOSIS — M1990 Unspecified osteoarthritis, unspecified site: Secondary | ICD-10-CM | POA: Diagnosis not present

## 2017-11-21 DIAGNOSIS — R918 Other nonspecific abnormal finding of lung field: Secondary | ICD-10-CM | POA: Diagnosis not present

## 2017-11-24 DIAGNOSIS — I2510 Atherosclerotic heart disease of native coronary artery without angina pectoris: Secondary | ICD-10-CM | POA: Diagnosis not present

## 2017-11-24 DIAGNOSIS — I2699 Other pulmonary embolism without acute cor pulmonale: Secondary | ICD-10-CM | POA: Diagnosis not present

## 2017-11-24 DIAGNOSIS — I700 Atherosclerosis of aorta: Secondary | ICD-10-CM | POA: Diagnosis not present

## 2017-11-24 DIAGNOSIS — I110 Hypertensive heart disease with heart failure: Secondary | ICD-10-CM | POA: Diagnosis not present

## 2017-11-24 DIAGNOSIS — R918 Other nonspecific abnormal finding of lung field: Secondary | ICD-10-CM | POA: Diagnosis not present

## 2017-11-24 DIAGNOSIS — I361 Nonrheumatic tricuspid (valve) insufficiency: Secondary | ICD-10-CM | POA: Diagnosis not present

## 2017-11-24 DIAGNOSIS — M1990 Unspecified osteoarthritis, unspecified site: Secondary | ICD-10-CM | POA: Diagnosis not present

## 2017-11-24 DIAGNOSIS — M5090 Cervical disc disorder, unspecified, unspecified cervical region: Secondary | ICD-10-CM | POA: Diagnosis not present

## 2017-11-24 DIAGNOSIS — I5042 Chronic combined systolic (congestive) and diastolic (congestive) heart failure: Secondary | ICD-10-CM | POA: Diagnosis not present

## 2017-11-24 DIAGNOSIS — E785 Hyperlipidemia, unspecified: Secondary | ICD-10-CM | POA: Diagnosis not present

## 2017-11-25 ENCOUNTER — Telehealth: Payer: Self-pay | Admitting: Family Medicine

## 2017-11-25 DIAGNOSIS — I110 Hypertensive heart disease with heart failure: Secondary | ICD-10-CM | POA: Diagnosis not present

## 2017-11-25 DIAGNOSIS — I361 Nonrheumatic tricuspid (valve) insufficiency: Secondary | ICD-10-CM | POA: Diagnosis not present

## 2017-11-25 DIAGNOSIS — I2510 Atherosclerotic heart disease of native coronary artery without angina pectoris: Secondary | ICD-10-CM | POA: Diagnosis not present

## 2017-11-25 DIAGNOSIS — I5042 Chronic combined systolic (congestive) and diastolic (congestive) heart failure: Secondary | ICD-10-CM | POA: Diagnosis not present

## 2017-11-25 DIAGNOSIS — M5090 Cervical disc disorder, unspecified, unspecified cervical region: Secondary | ICD-10-CM | POA: Diagnosis not present

## 2017-11-25 DIAGNOSIS — E785 Hyperlipidemia, unspecified: Secondary | ICD-10-CM | POA: Diagnosis not present

## 2017-11-25 DIAGNOSIS — I700 Atherosclerosis of aorta: Secondary | ICD-10-CM | POA: Diagnosis not present

## 2017-11-25 DIAGNOSIS — M1990 Unspecified osteoarthritis, unspecified site: Secondary | ICD-10-CM | POA: Diagnosis not present

## 2017-11-25 DIAGNOSIS — I2699 Other pulmonary embolism without acute cor pulmonale: Secondary | ICD-10-CM | POA: Diagnosis not present

## 2017-11-25 DIAGNOSIS — R918 Other nonspecific abnormal finding of lung field: Secondary | ICD-10-CM | POA: Diagnosis not present

## 2017-11-25 NOTE — Telephone Encounter (Signed)
Copied from CRM [phone]. Topic: General - Other >> Nov 25, 2017  4:16 PM Debroah LoopLander, Lumin L wrote: Reason for CRM: Threasa AlphaJim Hoffman, PT with [facility] calling for physical therapy verbal orders with frequency of 2x a wk for 4wks for fall risk reduction.

## 2017-11-26 DIAGNOSIS — I110 Hypertensive heart disease with heart failure: Secondary | ICD-10-CM | POA: Diagnosis not present

## 2017-11-26 DIAGNOSIS — R918 Other nonspecific abnormal finding of lung field: Secondary | ICD-10-CM | POA: Diagnosis not present

## 2017-11-26 DIAGNOSIS — I2699 Other pulmonary embolism without acute cor pulmonale: Secondary | ICD-10-CM | POA: Diagnosis not present

## 2017-11-26 DIAGNOSIS — M1990 Unspecified osteoarthritis, unspecified site: Secondary | ICD-10-CM | POA: Diagnosis not present

## 2017-11-26 DIAGNOSIS — M5090 Cervical disc disorder, unspecified, unspecified cervical region: Secondary | ICD-10-CM | POA: Diagnosis not present

## 2017-11-26 DIAGNOSIS — I5042 Chronic combined systolic (congestive) and diastolic (congestive) heart failure: Secondary | ICD-10-CM | POA: Diagnosis not present

## 2017-11-26 DIAGNOSIS — I361 Nonrheumatic tricuspid (valve) insufficiency: Secondary | ICD-10-CM | POA: Diagnosis not present

## 2017-11-26 DIAGNOSIS — E785 Hyperlipidemia, unspecified: Secondary | ICD-10-CM | POA: Diagnosis not present

## 2017-11-26 DIAGNOSIS — I700 Atherosclerosis of aorta: Secondary | ICD-10-CM | POA: Diagnosis not present

## 2017-11-26 DIAGNOSIS — I2510 Atherosclerotic heart disease of native coronary artery without angina pectoris: Secondary | ICD-10-CM | POA: Diagnosis not present

## 2017-11-26 NOTE — Telephone Encounter (Signed)
Left verbal orders on VM. 

## 2017-11-26 NOTE — Telephone Encounter (Signed)
Please give the order.  Thanks.   

## 2017-11-27 DIAGNOSIS — I2510 Atherosclerotic heart disease of native coronary artery without angina pectoris: Secondary | ICD-10-CM | POA: Diagnosis not present

## 2017-11-27 DIAGNOSIS — M5090 Cervical disc disorder, unspecified, unspecified cervical region: Secondary | ICD-10-CM | POA: Diagnosis not present

## 2017-11-27 DIAGNOSIS — I361 Nonrheumatic tricuspid (valve) insufficiency: Secondary | ICD-10-CM | POA: Diagnosis not present

## 2017-11-27 DIAGNOSIS — R918 Other nonspecific abnormal finding of lung field: Secondary | ICD-10-CM | POA: Diagnosis not present

## 2017-11-27 DIAGNOSIS — E785 Hyperlipidemia, unspecified: Secondary | ICD-10-CM | POA: Diagnosis not present

## 2017-11-27 DIAGNOSIS — I110 Hypertensive heart disease with heart failure: Secondary | ICD-10-CM | POA: Diagnosis not present

## 2017-11-27 DIAGNOSIS — I5042 Chronic combined systolic (congestive) and diastolic (congestive) heart failure: Secondary | ICD-10-CM | POA: Diagnosis not present

## 2017-11-27 DIAGNOSIS — M1990 Unspecified osteoarthritis, unspecified site: Secondary | ICD-10-CM | POA: Diagnosis not present

## 2017-11-27 DIAGNOSIS — I2699 Other pulmonary embolism without acute cor pulmonale: Secondary | ICD-10-CM | POA: Diagnosis not present

## 2017-11-27 DIAGNOSIS — I700 Atherosclerosis of aorta: Secondary | ICD-10-CM | POA: Diagnosis not present

## 2017-11-28 DIAGNOSIS — G4733 Obstructive sleep apnea (adult) (pediatric): Secondary | ICD-10-CM

## 2017-11-28 DIAGNOSIS — M1990 Unspecified osteoarthritis, unspecified site: Secondary | ICD-10-CM

## 2017-11-28 DIAGNOSIS — I700 Atherosclerosis of aorta: Secondary | ICD-10-CM

## 2017-11-28 DIAGNOSIS — R918 Other nonspecific abnormal finding of lung field: Secondary | ICD-10-CM

## 2017-11-28 DIAGNOSIS — I2510 Atherosclerotic heart disease of native coronary artery without angina pectoris: Secondary | ICD-10-CM

## 2017-11-28 DIAGNOSIS — G8929 Other chronic pain: Secondary | ICD-10-CM

## 2017-11-28 DIAGNOSIS — Z7901 Long term (current) use of anticoagulants: Secondary | ICD-10-CM

## 2017-11-28 DIAGNOSIS — Z87891 Personal history of nicotine dependence: Secondary | ICD-10-CM

## 2017-11-28 DIAGNOSIS — M545 Low back pain: Secondary | ICD-10-CM

## 2017-11-28 DIAGNOSIS — Z5181 Encounter for therapeutic drug level monitoring: Secondary | ICD-10-CM

## 2017-11-28 DIAGNOSIS — M5090 Cervical disc disorder, unspecified, unspecified cervical region: Secondary | ICD-10-CM

## 2017-11-28 DIAGNOSIS — I5042 Chronic combined systolic (congestive) and diastolic (congestive) heart failure: Secondary | ICD-10-CM

## 2017-11-28 DIAGNOSIS — K219 Gastro-esophageal reflux disease without esophagitis: Secondary | ICD-10-CM

## 2017-11-28 DIAGNOSIS — I110 Hypertensive heart disease with heart failure: Secondary | ICD-10-CM

## 2017-11-28 DIAGNOSIS — E785 Hyperlipidemia, unspecified: Secondary | ICD-10-CM

## 2017-11-28 DIAGNOSIS — I2699 Other pulmonary embolism without acute cor pulmonale: Secondary | ICD-10-CM

## 2017-11-28 DIAGNOSIS — I361 Nonrheumatic tricuspid (valve) insufficiency: Secondary | ICD-10-CM

## 2017-11-30 ENCOUNTER — Telehealth: Payer: Self-pay | Admitting: Family Medicine

## 2017-11-30 NOTE — Telephone Encounter (Signed)
Copied from CRM [id number]. Topic: General - Other >> Nov 30, 2017  1:42 PM Debroah LoopLander, Lumin L wrote: Reason for CRM:  Santina Evansatherine, OT with [facility] calling to get verbal orders for OT with frequency 1x a wk for 4wks.

## 2017-12-01 NOTE — Telephone Encounter (Signed)
Santina Evansatherine with OT, [facility] advised.

## 2017-12-01 NOTE — Telephone Encounter (Signed)
Please give the order.  Thanks.   

## 2017-12-04 DIAGNOSIS — I2699 Other pulmonary embolism without acute cor pulmonale: Secondary | ICD-10-CM | POA: Diagnosis not present

## 2017-12-04 DIAGNOSIS — I700 Atherosclerosis of aorta: Secondary | ICD-10-CM | POA: Diagnosis not present

## 2017-12-04 DIAGNOSIS — E785 Hyperlipidemia, unspecified: Secondary | ICD-10-CM | POA: Diagnosis not present

## 2017-12-04 DIAGNOSIS — I110 Hypertensive heart disease with heart failure: Secondary | ICD-10-CM | POA: Diagnosis not present

## 2017-12-04 DIAGNOSIS — M5090 Cervical disc disorder, unspecified, unspecified cervical region: Secondary | ICD-10-CM | POA: Diagnosis not present

## 2017-12-04 DIAGNOSIS — I5042 Chronic combined systolic (congestive) and diastolic (congestive) heart failure: Secondary | ICD-10-CM | POA: Diagnosis not present

## 2017-12-04 DIAGNOSIS — I361 Nonrheumatic tricuspid (valve) insufficiency: Secondary | ICD-10-CM | POA: Diagnosis not present

## 2017-12-04 DIAGNOSIS — I2510 Atherosclerotic heart disease of native coronary artery without angina pectoris: Secondary | ICD-10-CM | POA: Diagnosis not present

## 2017-12-04 DIAGNOSIS — R918 Other nonspecific abnormal finding of lung field: Secondary | ICD-10-CM | POA: Diagnosis not present

## 2017-12-04 DIAGNOSIS — M1990 Unspecified osteoarthritis, unspecified site: Secondary | ICD-10-CM | POA: Diagnosis not present

## 2017-12-08 DIAGNOSIS — I110 Hypertensive heart disease with heart failure: Secondary | ICD-10-CM | POA: Diagnosis not present

## 2017-12-08 DIAGNOSIS — M5090 Cervical disc disorder, unspecified, unspecified cervical region: Secondary | ICD-10-CM | POA: Diagnosis not present

## 2017-12-08 DIAGNOSIS — R918 Other nonspecific abnormal finding of lung field: Secondary | ICD-10-CM | POA: Diagnosis not present

## 2017-12-08 DIAGNOSIS — I5042 Chronic combined systolic (congestive) and diastolic (congestive) heart failure: Secondary | ICD-10-CM | POA: Diagnosis not present

## 2017-12-08 DIAGNOSIS — E785 Hyperlipidemia, unspecified: Secondary | ICD-10-CM | POA: Diagnosis not present

## 2017-12-08 DIAGNOSIS — M1990 Unspecified osteoarthritis, unspecified site: Secondary | ICD-10-CM | POA: Diagnosis not present

## 2017-12-08 DIAGNOSIS — I2699 Other pulmonary embolism without acute cor pulmonale: Secondary | ICD-10-CM | POA: Diagnosis not present

## 2017-12-08 DIAGNOSIS — I2510 Atherosclerotic heart disease of native coronary artery without angina pectoris: Secondary | ICD-10-CM | POA: Diagnosis not present

## 2017-12-08 DIAGNOSIS — I361 Nonrheumatic tricuspid (valve) insufficiency: Secondary | ICD-10-CM | POA: Diagnosis not present

## 2017-12-08 DIAGNOSIS — I700 Atherosclerosis of aorta: Secondary | ICD-10-CM | POA: Diagnosis not present

## 2017-12-10 DIAGNOSIS — M1990 Unspecified osteoarthritis, unspecified site: Secondary | ICD-10-CM | POA: Diagnosis not present

## 2017-12-10 DIAGNOSIS — R32 Unspecified urinary incontinence: Secondary | ICD-10-CM | POA: Diagnosis not present

## 2017-12-10 DIAGNOSIS — I252 Old myocardial infarction: Secondary | ICD-10-CM | POA: Diagnosis not present

## 2017-12-10 DIAGNOSIS — E785 Hyperlipidemia, unspecified: Secondary | ICD-10-CM | POA: Diagnosis not present

## 2017-12-10 DIAGNOSIS — G8929 Other chronic pain: Secondary | ICD-10-CM | POA: Diagnosis not present

## 2017-12-10 DIAGNOSIS — I10 Essential (primary) hypertension: Secondary | ICD-10-CM | POA: Diagnosis not present

## 2017-12-10 DIAGNOSIS — E669 Obesity, unspecified: Secondary | ICD-10-CM | POA: Diagnosis not present

## 2017-12-10 DIAGNOSIS — R21 Rash and other nonspecific skin eruption: Secondary | ICD-10-CM | POA: Diagnosis not present

## 2017-12-10 DIAGNOSIS — L299 Pruritus, unspecified: Secondary | ICD-10-CM | POA: Diagnosis not present

## 2017-12-10 DIAGNOSIS — G3184 Mild cognitive impairment, so stated: Secondary | ICD-10-CM | POA: Diagnosis not present

## 2017-12-15 DIAGNOSIS — I361 Nonrheumatic tricuspid (valve) insufficiency: Secondary | ICD-10-CM | POA: Diagnosis not present

## 2017-12-15 DIAGNOSIS — I2699 Other pulmonary embolism without acute cor pulmonale: Secondary | ICD-10-CM | POA: Diagnosis not present

## 2017-12-15 DIAGNOSIS — I110 Hypertensive heart disease with heart failure: Secondary | ICD-10-CM | POA: Diagnosis not present

## 2017-12-15 DIAGNOSIS — M5090 Cervical disc disorder, unspecified, unspecified cervical region: Secondary | ICD-10-CM | POA: Diagnosis not present

## 2017-12-15 DIAGNOSIS — R918 Other nonspecific abnormal finding of lung field: Secondary | ICD-10-CM | POA: Diagnosis not present

## 2017-12-15 DIAGNOSIS — M1990 Unspecified osteoarthritis, unspecified site: Secondary | ICD-10-CM | POA: Diagnosis not present

## 2017-12-15 DIAGNOSIS — I5042 Chronic combined systolic (congestive) and diastolic (congestive) heart failure: Secondary | ICD-10-CM | POA: Diagnosis not present

## 2017-12-15 DIAGNOSIS — E785 Hyperlipidemia, unspecified: Secondary | ICD-10-CM | POA: Diagnosis not present

## 2017-12-15 DIAGNOSIS — I700 Atherosclerosis of aorta: Secondary | ICD-10-CM | POA: Diagnosis not present

## 2017-12-15 DIAGNOSIS — I2510 Atherosclerotic heart disease of native coronary artery without angina pectoris: Secondary | ICD-10-CM | POA: Diagnosis not present

## 2017-12-17 DIAGNOSIS — I110 Hypertensive heart disease with heart failure: Secondary | ICD-10-CM | POA: Diagnosis not present

## 2017-12-17 DIAGNOSIS — I5042 Chronic combined systolic (congestive) and diastolic (congestive) heart failure: Secondary | ICD-10-CM | POA: Diagnosis not present

## 2017-12-17 DIAGNOSIS — I2510 Atherosclerotic heart disease of native coronary artery without angina pectoris: Secondary | ICD-10-CM | POA: Diagnosis not present

## 2017-12-17 DIAGNOSIS — M1990 Unspecified osteoarthritis, unspecified site: Secondary | ICD-10-CM | POA: Diagnosis not present

## 2017-12-17 DIAGNOSIS — R918 Other nonspecific abnormal finding of lung field: Secondary | ICD-10-CM | POA: Diagnosis not present

## 2017-12-17 DIAGNOSIS — I2699 Other pulmonary embolism without acute cor pulmonale: Secondary | ICD-10-CM | POA: Diagnosis not present

## 2017-12-17 DIAGNOSIS — E785 Hyperlipidemia, unspecified: Secondary | ICD-10-CM | POA: Diagnosis not present

## 2017-12-17 DIAGNOSIS — I361 Nonrheumatic tricuspid (valve) insufficiency: Secondary | ICD-10-CM | POA: Diagnosis not present

## 2017-12-17 DIAGNOSIS — M5090 Cervical disc disorder, unspecified, unspecified cervical region: Secondary | ICD-10-CM | POA: Diagnosis not present

## 2017-12-17 DIAGNOSIS — I700 Atherosclerosis of aorta: Secondary | ICD-10-CM | POA: Diagnosis not present

## 2017-12-21 ENCOUNTER — Telehealth: Payer: Self-pay

## 2017-12-21 ENCOUNTER — Telehealth: Payer: Self-pay | Admitting: Family Medicine

## 2017-12-21 DIAGNOSIS — R918 Other nonspecific abnormal finding of lung field: Secondary | ICD-10-CM | POA: Diagnosis not present

## 2017-12-21 DIAGNOSIS — I110 Hypertensive heart disease with heart failure: Secondary | ICD-10-CM | POA: Diagnosis not present

## 2017-12-21 DIAGNOSIS — I361 Nonrheumatic tricuspid (valve) insufficiency: Secondary | ICD-10-CM | POA: Diagnosis not present

## 2017-12-21 DIAGNOSIS — M1990 Unspecified osteoarthritis, unspecified site: Secondary | ICD-10-CM | POA: Diagnosis not present

## 2017-12-21 DIAGNOSIS — E785 Hyperlipidemia, unspecified: Secondary | ICD-10-CM | POA: Diagnosis not present

## 2017-12-21 DIAGNOSIS — I700 Atherosclerosis of aorta: Secondary | ICD-10-CM | POA: Diagnosis not present

## 2017-12-21 DIAGNOSIS — I2510 Atherosclerotic heart disease of native coronary artery without angina pectoris: Secondary | ICD-10-CM | POA: Diagnosis not present

## 2017-12-21 DIAGNOSIS — M5090 Cervical disc disorder, unspecified, unspecified cervical region: Secondary | ICD-10-CM | POA: Diagnosis not present

## 2017-12-21 DIAGNOSIS — I2699 Other pulmonary embolism without acute cor pulmonale: Secondary | ICD-10-CM | POA: Diagnosis not present

## 2017-12-21 DIAGNOSIS — I5042 Chronic combined systolic (congestive) and diastolic (congestive) heart failure: Secondary | ICD-10-CM | POA: Diagnosis not present

## 2017-12-21 NOTE — Telephone Encounter (Signed)
Patient's daughter (June) notified as instructed by telephone and verbalized understanding. June stated that she has been dizzy for 2 days. Appointment scheduled with Dr. Para Marchuncan tomorrow. Advised patient's daughter if she gets any worse before her appointment tomorrow to seek medical attention.UC, etc and she agreed.

## 2017-12-21 NOTE — Telephone Encounter (Signed)
If sx persist then either get checked today or tomorrow, either here, UC, etc.  Thanks.

## 2017-12-21 NOTE — Telephone Encounter (Signed)
Copied from CRM [id number]. Topic: Quick Communication - See Telephone Encounter >> Dec 21, 2017 12:34 PM Cipriano BunkerLambe, Annette S wrote: CRM for notification. See Telephone encounter for: 12/21/17.  Waynetta SandyBeth - [facility] [phone] Pt is complaining of dizziness only when she is walking and says this started yesterday 6/30,  Beth checked  BP 138/72 seated - standing 130/70 -Oxygen is 95 % heart rate 56 , drinking water and staying hydrated - says no oder on urine by very yellow.  Asking if needs to be seen and check for possible UTI -  It could be vertigo and if so would need orders to get her evaluated.    Daughter will be with her this afternoon, and please call and let her know if needs to come in

## 2017-12-21 NOTE — Telephone Encounter (Signed)
Copied from CRM [id number]. Topic: Quick Communication - See Telephone Encounter >> Dec 21, 2017 12:34 PM Cipriano BunkerLambe, Annette S wrote: CRM for notification. See Telephone encounter for: 12/21/17.  Waynetta SandyBeth - [facility] [phone] Complaining of dizziness only when walking and this started yesterday 6/30 - she checked  BP 138/72 seated - standing 130/70 -Oxygen is 95 % heart rate 56   stating if needs to be seen or check if UTI - asking what they may need to do.  Daughter will be with her this afternoon

## 2017-12-22 ENCOUNTER — Ambulatory Visit: Payer: Medicare HMO | Admitting: Family Medicine

## 2017-12-22 ENCOUNTER — Encounter: Payer: Self-pay | Admitting: Family Medicine

## 2017-12-22 VITALS — BP 140/80 | HR 61 | Temp 97.6°F | Ht 62.0 in | Wt 154.0 lb

## 2017-12-22 DIAGNOSIS — Z87440 Personal history of urinary (tract) infections: Secondary | ICD-10-CM | POA: Diagnosis not present

## 2017-12-22 DIAGNOSIS — R319 Hematuria, unspecified: Secondary | ICD-10-CM | POA: Diagnosis not present

## 2017-12-22 DIAGNOSIS — N200 Calculus of kidney: Secondary | ICD-10-CM | POA: Diagnosis not present

## 2017-12-22 LAB — POC URINALSYSI DIPSTICK (AUTOMATED)
GLUCOSE UA: NEGATIVE
Ketones, UA: NEGATIVE
Leukocytes, UA: NEGATIVE
Nitrite, UA: NEGATIVE
PROTEIN UA: NEGATIVE
SPEC GRAV UA: 1.025 (ref 1.010–1.025)
UROBILINOGEN UA: 0.2 U/dL
pH, UA: 6 (ref 5.0–8.0)

## 2017-12-22 NOTE — Progress Notes (Signed)
2 days ago she was having trouble with balance.  She was dizzy at the time, no room spinning but she felt like she was spinning.  She had a sensation of motion.  Sx got better last night.  No sx as of this AM.  No FCNAVD.  No ear pain.  Prev R eyelids/orbid were puffy with conjunctival irritation.  No lip/facial droop.  No dysuria.  She is clear about details today but had some recent lapses in memory over the last few days.  No dysuria.   Meds, vitals, and allergies reviewed.   ROS: Per HPI unless specifically indicated in ROS section   GEN: nad, alert and oriented HEENT: mucous membranes moist, TM wnl B, Resolving bruising on the L side of face. No facial muscle weakness NECK: supple w/o LA CV: rrr. PULM: ctab, no inc wob ABD: soft, +bs EXT: trace edema SKIN: no acute rash Speech wnl.

## 2017-12-22 NOTE — Patient Instructions (Signed)
We'll contact you with your lab report. Don't change your meds for now. Take care.  Glad to see you.  

## 2017-12-23 ENCOUNTER — Telehealth: Payer: Self-pay | Admitting: Family Medicine

## 2017-12-23 DIAGNOSIS — Z87440 Personal history of urinary (tract) infections: Secondary | ICD-10-CM | POA: Insufficient documentation

## 2017-12-23 LAB — URINE CULTURE
MICRO NUMBER:: 90787473
Result:: NO GROWTH
SPECIMEN QUALITY:: ADEQUATE

## 2017-12-23 NOTE — Telephone Encounter (Signed)
No, not able to add. Specimen has been disposed of

## 2017-12-23 NOTE — Telephone Encounter (Signed)
[name]- patient had u/a with ucx done.  Can urine micro be added on?  Please let me know.  If so, please proceed. Thanks.

## 2017-12-23 NOTE — Assessment & Plan Note (Signed)
It sounds like she had an episode of vertigo.  Symptoms resolved now.  No focal neurologic changes.  Her cranial nerve exam is normal.  Strength and sensation are grossly normal.  She has a history of UTI with atypical symptoms.  Reasonable to check urinalysis and urine culture.  See notes on labs.  Supportive care for now.  No change in medication.  Update me as needed.    Family asked us to check with home health agency about the duration of her care.  Delilah ShanLugene Fuquay check with the agency and evidently her current therapy can continue at least through the end of the month.

## 2017-12-25 ENCOUNTER — Other Ambulatory Visit: Payer: Self-pay | Admitting: Family Medicine

## 2017-12-25 ENCOUNTER — Telehealth: Payer: Self-pay | Admitting: Family Medicine

## 2017-12-25 DIAGNOSIS — R8290 Unspecified abnormal findings in urine: Secondary | ICD-10-CM

## 2017-12-25 NOTE — Telephone Encounter (Signed)
Threasa AlphaJim Hoffman with [facility].

## 2017-12-25 NOTE — Telephone Encounter (Signed)
Copied from CRM [id number]. Topic: Inquiry >> Dec 25, 2017 10:59 AM Leafy Roobinson, Norma J wrote: Reason for CRM: Threasa AlphaJim Hoffman [facility] is calling requesting verbal orders for PT . Rosanne AshingJim missed one visit and would like to make up next week

## 2017-12-25 NOTE — Telephone Encounter (Signed)
Noted. Thanks.

## 2017-12-25 NOTE — Telephone Encounter (Signed)
Please give the order.  Thanks.   

## 2017-12-28 ENCOUNTER — Telehealth: Payer: Self-pay | Admitting: Family Medicine

## 2017-12-28 NOTE — Telephone Encounter (Signed)
Copied from CRM [id number]. Topic: Quick Communication - See Telephone Encounter >> Dec 28, 2017 12:08 PM Tamela OddiMartin, Don'Quashia, NT wrote: CRM for notification. See Telephone encounter for: 12/28/17. Rosanne AshingJim calling from [facility] is requesting verbal orders. The orders are 1x a week for 3 weeks for  functional mobility. CB# [phone]

## 2017-12-28 NOTE — Telephone Encounter (Signed)
Please give the order.  Thanks.   

## 2017-12-28 NOTE — Telephone Encounter (Signed)
Left detailed message on voicemail of Rosanne AshingJim with [facility].

## 2017-12-29 ENCOUNTER — Telehealth: Payer: Self-pay | Admitting: Family Medicine

## 2017-12-29 NOTE — Telephone Encounter (Signed)
Copied from CRM [id number]. Topic: Quick Communication - See Telephone Encounter >> Dec 29, 2017  5:01 PM Lorrine KinMcGee, Nyemah Watton B, NT wrote: CRM for notification. See Telephone encounter for: 12/29/17. Donita calling with [facility] to request more visits for the following: 1x a week for 3 weeks CB#: [phone]

## 2017-12-30 NOTE — Telephone Encounter (Signed)
Please give the order.  Thanks.   

## 2017-12-30 NOTE — Telephone Encounter (Signed)
Verbal order given to Donita as instructed by telephone and verbalized understanding.

## 2017-12-31 DIAGNOSIS — I700 Atherosclerosis of aorta: Secondary | ICD-10-CM | POA: Diagnosis not present

## 2017-12-31 DIAGNOSIS — R918 Other nonspecific abnormal finding of lung field: Secondary | ICD-10-CM | POA: Diagnosis not present

## 2017-12-31 DIAGNOSIS — E785 Hyperlipidemia, unspecified: Secondary | ICD-10-CM | POA: Diagnosis not present

## 2017-12-31 DIAGNOSIS — I110 Hypertensive heart disease with heart failure: Secondary | ICD-10-CM | POA: Diagnosis not present

## 2017-12-31 DIAGNOSIS — M5090 Cervical disc disorder, unspecified, unspecified cervical region: Secondary | ICD-10-CM | POA: Diagnosis not present

## 2017-12-31 DIAGNOSIS — M1990 Unspecified osteoarthritis, unspecified site: Secondary | ICD-10-CM | POA: Diagnosis not present

## 2017-12-31 DIAGNOSIS — I2510 Atherosclerotic heart disease of native coronary artery without angina pectoris: Secondary | ICD-10-CM | POA: Diagnosis not present

## 2017-12-31 DIAGNOSIS — I361 Nonrheumatic tricuspid (valve) insufficiency: Secondary | ICD-10-CM | POA: Diagnosis not present

## 2017-12-31 DIAGNOSIS — I5042 Chronic combined systolic (congestive) and diastolic (congestive) heart failure: Secondary | ICD-10-CM | POA: Diagnosis not present

## 2017-12-31 DIAGNOSIS — I2699 Other pulmonary embolism without acute cor pulmonale: Secondary | ICD-10-CM | POA: Diagnosis not present

## 2018-01-05 ENCOUNTER — Ambulatory Visit (INDEPENDENT_AMBULATORY_CARE_PROVIDER_SITE_OTHER): Payer: Medicare HMO | Admitting: Family Medicine

## 2018-01-05 ENCOUNTER — Encounter: Payer: Self-pay | Admitting: Family Medicine

## 2018-01-05 VITALS — BP 136/72 | HR 66 | Temp 97.4°F | Ht 62.0 in | Wt 154.5 lb

## 2018-01-05 DIAGNOSIS — Z87442 Personal history of urinary calculi: Secondary | ICD-10-CM | POA: Diagnosis not present

## 2018-01-05 DIAGNOSIS — N200 Calculus of kidney: Secondary | ICD-10-CM | POA: Diagnosis not present

## 2018-01-05 LAB — POC URINALSYSI DIPSTICK (AUTOMATED)
GLUCOSE UA: NEGATIVE
Ketones, UA: NEGATIVE
Leukocytes, UA: NEGATIVE
NITRITE UA: NEGATIVE
Protein, UA: POSITIVE — AB
Spec Grav, UA: >=1.03 — AB (ref 1.010–1.025)
UROBILINOGEN UA: 0.2 U/dL
pH, UA: 6 (ref 5.0–8.0)

## 2018-01-05 NOTE — Patient Instructions (Signed)
We will call about your referral.  Marion or Anastasiya will call you if you don't see one of them on the way out.  Go to the lab on the way out.  We'll contact you with your lab report. Take care.  Glad to see you.  

## 2018-01-05 NOTE — Progress Notes (Signed)
No urinary sx.  D/w pt about f/u urine studies.  D/w pt about urology referral.  She has history of bilateral nephrolithiasis, previously seen on CT.  Discussed with patient and daughter.  She isn't SOB and is breathing "okay."  No falls in the interval.  Still using walker.  She did PT, ongoing at this point.    No FCNAVD.    Meds, vitals, and allergies reviewed.   ROS: Per HPI unless specifically indicated in ROS section   GEN: nad, alert and oriented HEENT: mucous membranes moist NECK: supple w/o LA CV: rrr. PULM: ctab, no inc wob ABD: soft, +bs, not ttp  EXT: Trace BLE edema.   SKIN: Well-perfused.

## 2018-01-06 ENCOUNTER — Telehealth: Payer: Self-pay | Admitting: Family Medicine

## 2018-01-06 NOTE — Telephone Encounter (Signed)
Copied from CRM [id number]. Topic: Quick Communication - Lab Results >> Jan 06, 2018 12:28 PM Larry Sierrasavis, Trisha L, LPN wrote: Called patient to inform them of lab results. When patient returns call, triage nurse may disclose results. >> Jan 06, 2018  4:21 PM Arlyss Gandyichardson, Murrel Bertram N, NT wrote: Pts daughter calling back to get moms lab results.

## 2018-01-06 NOTE — Assessment & Plan Note (Signed)
She has a history of hematuria.  Recheck urinalysis today.  We talked about options.  She is on a blood thinner and she has bilateral nonobstructing nephrolithiasis.  That could contribute to microscopic hematuria.  It is reasonable for her to see urology just so she will be established with them in case she developed symptomatic nephrolithiasis.  It is also reasonable to ask them if anything else should be done regarding her hematuria.  Given her age and comorbidities she may not tolerate an extensive work-up but I would like their input.  Rationale for all of this was discussed with the patient and daughter.  We talked about previous imaging.  It was a detailed conversation.  Given that she has no urinary symptoms right now, I did not culture her urine and if she has an abnormal urinalysis I would not start antibiotics.  Still okay for outpatient follow-up.  I did not stop anticoagulation at this point.  >25 minutes spent in face to face time with patient, >50% spent in counselling or coordination of care.

## 2018-01-06 NOTE — Telephone Encounter (Signed)
See result note.  

## 2018-01-12 DIAGNOSIS — I700 Atherosclerosis of aorta: Secondary | ICD-10-CM | POA: Diagnosis not present

## 2018-01-12 DIAGNOSIS — I2510 Atherosclerotic heart disease of native coronary artery without angina pectoris: Secondary | ICD-10-CM | POA: Diagnosis not present

## 2018-01-12 DIAGNOSIS — R918 Other nonspecific abnormal finding of lung field: Secondary | ICD-10-CM | POA: Diagnosis not present

## 2018-01-12 DIAGNOSIS — I110 Hypertensive heart disease with heart failure: Secondary | ICD-10-CM | POA: Diagnosis not present

## 2018-01-12 DIAGNOSIS — I5042 Chronic combined systolic (congestive) and diastolic (congestive) heart failure: Secondary | ICD-10-CM | POA: Diagnosis not present

## 2018-01-12 DIAGNOSIS — M1990 Unspecified osteoarthritis, unspecified site: Secondary | ICD-10-CM | POA: Diagnosis not present

## 2018-01-12 DIAGNOSIS — I2699 Other pulmonary embolism without acute cor pulmonale: Secondary | ICD-10-CM | POA: Diagnosis not present

## 2018-01-12 DIAGNOSIS — M5090 Cervical disc disorder, unspecified, unspecified cervical region: Secondary | ICD-10-CM | POA: Diagnosis not present

## 2018-01-12 DIAGNOSIS — E785 Hyperlipidemia, unspecified: Secondary | ICD-10-CM | POA: Diagnosis not present

## 2018-01-12 DIAGNOSIS — I361 Nonrheumatic tricuspid (valve) insufficiency: Secondary | ICD-10-CM | POA: Diagnosis not present

## 2018-01-18 DIAGNOSIS — M1990 Unspecified osteoarthritis, unspecified site: Secondary | ICD-10-CM | POA: Diagnosis not present

## 2018-01-18 DIAGNOSIS — R918 Other nonspecific abnormal finding of lung field: Secondary | ICD-10-CM | POA: Diagnosis not present

## 2018-01-18 DIAGNOSIS — I361 Nonrheumatic tricuspid (valve) insufficiency: Secondary | ICD-10-CM | POA: Diagnosis not present

## 2018-01-18 DIAGNOSIS — I700 Atherosclerosis of aorta: Secondary | ICD-10-CM | POA: Diagnosis not present

## 2018-01-18 DIAGNOSIS — I110 Hypertensive heart disease with heart failure: Secondary | ICD-10-CM | POA: Diagnosis not present

## 2018-01-18 DIAGNOSIS — I5042 Chronic combined systolic (congestive) and diastolic (congestive) heart failure: Secondary | ICD-10-CM | POA: Diagnosis not present

## 2018-01-18 DIAGNOSIS — E785 Hyperlipidemia, unspecified: Secondary | ICD-10-CM | POA: Diagnosis not present

## 2018-01-18 DIAGNOSIS — M5090 Cervical disc disorder, unspecified, unspecified cervical region: Secondary | ICD-10-CM | POA: Diagnosis not present

## 2018-01-18 DIAGNOSIS — I2510 Atherosclerotic heart disease of native coronary artery without angina pectoris: Secondary | ICD-10-CM | POA: Diagnosis not present

## 2018-01-18 DIAGNOSIS — I2699 Other pulmonary embolism without acute cor pulmonale: Secondary | ICD-10-CM | POA: Diagnosis not present

## 2018-01-25 ENCOUNTER — Other Ambulatory Visit: Payer: Self-pay | Admitting: *Deleted

## 2018-01-25 MED ORDER — APIXABAN 5 MG PO TABS: 5.0000 mg | ORAL_TABLET | Freq: Two times a day (BID) | ORAL | 4 refills | Status: DC

## 2018-01-25 NOTE — Telephone Encounter (Signed)
Pt is a 84 yr old female who saw Dr Tenny Crawoss on 11/10/17. SCr on that date was 1.02. Last noted weight on 01/05/18 was 70.1Kg. Will refill Eliquis 5mg  BID.

## 2018-02-04 ENCOUNTER — Encounter: Payer: Self-pay | Admitting: Internal Medicine

## 2018-03-01 ENCOUNTER — Ambulatory Visit: Payer: Medicare HMO | Admitting: Internal Medicine

## 2018-03-03 ENCOUNTER — Ambulatory Visit: Payer: Medicare HMO | Admitting: Cardiology

## 2018-03-03 ENCOUNTER — Encounter: Payer: Self-pay | Admitting: Cardiology

## 2018-03-03 VITALS — BP 132/60 | HR 63 | Ht 62.0 in | Wt 157.0 lb

## 2018-03-03 DIAGNOSIS — E782 Mixed hyperlipidemia: Secondary | ICD-10-CM

## 2018-03-03 DIAGNOSIS — I2699 Other pulmonary embolism without acute cor pulmonale: Secondary | ICD-10-CM | POA: Diagnosis not present

## 2018-03-03 DIAGNOSIS — Z66 Do not resuscitate: Secondary | ICD-10-CM | POA: Diagnosis not present

## 2018-03-03 DIAGNOSIS — I5022 Chronic systolic (congestive) heart failure: Secondary | ICD-10-CM | POA: Insufficient documentation

## 2018-03-03 DIAGNOSIS — R42 Dizziness and giddiness: Secondary | ICD-10-CM

## 2018-03-03 DIAGNOSIS — Z7189 Other specified counseling: Secondary | ICD-10-CM | POA: Diagnosis not present

## 2018-03-03 DIAGNOSIS — I10 Essential (primary) hypertension: Secondary | ICD-10-CM

## 2018-03-03 NOTE — Patient Instructions (Addendum)
Medication Instructions: Your physician recommends that you continue on your current medications as directed. Please refer to the Current Medication list given to you today.  Labwork: None  Procedures/Testing: None  Follow-Up: Your physician wants you to follow-up in: 6 months with Dr. Filbert Schilder will receive a reminder letter in the mail two months in advance. If you don't receive a letter, please call our office to schedule the follow-up appointment.   Any Additional Special Instructions Will Be Listed Below (If Applicable).     If you need a refill on your cardiac medications before your next appointment, please call your pharmacy.

## 2018-03-03 NOTE — Progress Notes (Signed)
Cardiology Office Note:    Date:  03/03/2018   ID:  [name], DOB August 10, 1923, MRN [id number]  PCP:  Joaquim Nam, MD  Cardiologist:  Dietrich Pates, MD  Referring MD: Joaquim Nam, MD   Chief Complaint  Patient presents with  . Follow-up    Heart Failure    History of Present Illness:    [name] is a 84 y.o. female with a past medical history significant for HTN, syncope, HLD, OSA on CPAP, systolic CHF with EF 30-35% percent.  She was seen at the hospital in 10/2017 for syncope and found to have bilateral PE with right heart strain.  Echo showed severe RV dysfunction.  She was not a candidate for lytics.  Lower extremity Dopplers found bilateral lower extremity DVT.  She was discharged on home oxygen and Eliquis for anticoagulation.  Echocardiogram on 11/13/2017 showed EF 30-35%, mild LVH, apical and inferior wall akinesis with anterior hypokinesis.  The study was not technically patient to allow for evaluation of LV diastolic function.    She was seen by Dr. Tenny Craw at hospital follow-up on 11/20/2017 at which time she was stable.  She had had a fall since her hospitalization and Dr. Tenny Craw recommended further PT/HH. It was documented that the patient should be on lifelong anticoagulation unless she develops bleeding.  [name] is here today for close follow up. She is here with her daughter. She lives at [facility]. She goes to the dining room for meals and goes out shopping with her daughter. She walks with a rolator. She denies chest discomfort or dyspnea. She had some mild dizziness while at the beauty shop at [facility] about a month ago. Her daughter thinks that it was hot. The nurse at [facility] checked her and she had normal BP and HR. Also 2 weeks ago she had a mild dizziness while walking to the dining hall. She uses her rolator and she knows to turn and sit down until it passes. Her daughter says that she is very careful.  She was told in the past to  eat a Snicker[employer] candy bar every morning which she does and it seems to help.    Past Medical History:  Diagnosis Date  . AK (actinic keratosis)   . Allergy   . Chronic dermatitis    face, allergic to many products  . GERD (gastroesophageal reflux disease)   . Hyperlipidemia   . Hypertension   . Kidney stones   . Overactive bladder   . Pulmonary embolism (HCC)   . Sleep apnea   . Syncope     Past Surgical History:  Procedure Laterality Date  . broken nose    . COLONOSCOPY  2004    Current Medications: Current Meds  Medication Sig  . acetaminophen (TYLENOL) 500 MG tablet Take 1,000 mg by mouth 2 (two) times daily.   Marland Kitchen apixaban (ELIQUIS) 5 MG TABS tablet Take 1 tablet (5 mg total) by mouth 2 (two) times daily.  . camphor-phenol (CAMPHO-PHENIQUE) 10.8-4.7 % LIQD Apply 1 application topically daily as needed for mild pain or itching.  . carvedilol (COREG) 3.125 MG tablet Take 1 tablet (3.125 mg total) by mouth 2 (two) times daily with a meal.  . cholecalciferol (VITAMIN D) 1000 UNITS tablet Take 1,000 Units by mouth daily.   . diclofenac sodium (VOLTAREN) 1 % GEL Apply 2 g 4 (four) times daily topically. On the left knee for pain (Patient taking differently: Apply 2 g topically  daily as needed. On the left knee for pain)  . ferrous sulfate 325 (65 FE) MG tablet Take 1 tablet (325 mg total) by mouth daily with breakfast.  . pravastatin (PRAVACHOL) 40 MG tablet Take 1 tablet (40 mg total) by mouth every evening.  Marland Kitchen Propylene Glycol (SYSTANE BALANCE OP) Apply 1 drop to eye as needed (dry eyes).  . sacubitril-valsartan (ENTRESTO) 24-26 MG Take 1 tablet by mouth 2 (two) times daily.  . [DISCONTINUED] fluticasone (FLONASE) 50 MCG/ACT nasal spray Place 2 sprays into both nostrils as needed for allergies or rhinitis.     Allergies:   Codeine; Levofloxacin; Neospect [dyphylline-ephed-pb-gg or]; Spironolactone; Tramadol; and Neosporin [neomycin-bacitracin zn-polymyx]   Social History    Socioeconomic History  . Marital status: Widowed    Spouse name: Not on file  . Number of children: Not on file  . Years of education: Not on file  . Highest education level: Not on file  Occupational History  . Not on file  Social Needs  . Financial resource strain: Not on file  . Food insecurity:    Worry: Not on file    Inability: Not on file  . Transportation needs:    Medical: Not on file    Non-medical: Not on file  Tobacco Use  . Smoking status: Former Smoker    Types: Cigarettes  . Smokeless tobacco: Never Used  . Tobacco comment: smoked during college  Substance and Sexual Activity  . Alcohol use: No  . Drug use: No  . Sexual activity: Not on file  Lifestyle  . Physical activity:    Days per week: Not on file    Minutes per session: Not on file  . Stress: Not on file  Relationships  . Social connections:    Talks on phone: Not on file    Gets together: Not on file    Attends religious service: Not on file    Active member of club or organization: Not on file    Attends meetings of clubs or organizations: Not on file    Relationship status: Not on file  Other Topics Concern  . Not on file  Social History Narrative   Widowed 2014 -husband had coronary artery disease and valvular disease   Has grand children and great grandchildren     Family History: The patient's family history includes Cancer in her mother; Coronary artery disease in her father. There is no history of Colon cancer. ROS:   Please see the history of present illness.     All other systems reviewed and are negative.  EKGs/Labs/Other Studies Reviewed:    The following studies were reviewed today:  Echocardiogram 11/13/17 Study Conclusions - Left ventricle: The cavity size was normal. Wall thickness was   increased in a pattern of mild LVH. There was moderate focal   basal hypertrophy of the septum. Apical and inferior wall   akinesis, anterior hypokinesis, incoordinate septal  motion.   Systolic function was moderately to severely reduced. The   estimated ejection fraction was in the range of 30% to 35%. The   study is not technically sufficient to allow evaluation of LV   diastolic function. - Ventricular septum: Septal motion showed abnormal function and   dyssynergy. - Mitral valve: Calcified annulus. Mildly thickened leaflets .   There was trivial regurgitation. - Left atrium: The atrium was normal in size. - Right ventricle: The cavity size was mildly dilated. Moderate RV   free wall hypokinesis with hyperdynamic apical function   (  McConnell&'s sign). - Right atrium: The atrium was mildly dilated. - Tricuspid valve: There was moderate regurgitation. - Pulmonary arteries: PA peak pressure: 54 mm Hg (S). - Inferior vena cava: The vessel was dilated. The respirophasic   diameter changes were blunted (< 50%), consistent with elevated   central venous pressure.  Impressions: - LVEF stable at 30-35% with inferior and apical akinesis, anterior   hypokinesis and septal dyskinesis. There is moderate RV systolic   dysfunction with hyperdynamic RV apical function (McConnell&'s   sign) suggestive of significant RV strain. There is moderate   pulmonary hypertension with RVSP of 54 mmHg and a dilated IVC.  EKG:  EKG is not ordered today.    Recent Labs: 07/27/2017: NT-Pro BNP 625 11/13/2017: B Natriuretic Peptide 180.4 11/19/2017: BUN 18; Creatinine, Ser 1.02; Hemoglobin 12.6; Platelets 264.0; Potassium 4.4; Sodium 139   Recent Lipid Panel    Component Value Date/Time   CHOL 118 07/27/2017 1303   TRIG 109 07/27/2017 1303   HDL 54 07/27/2017 1303   CHOLHDL 2.2 07/27/2017 1303   CHOLHDL 3 05/09/2011 0955   VLDL 20.6 05/09/2011 0955   LDLCALC 42 07/27/2017 1303    Physical Exam:    VS:  BP 132/60   Pulse 63   Ht 5\' 2"  (1.575 m)   Wt 157 lb (71.2 kg)   SpO2 93%   BMI 28.72 kg/m     Wt Readings from Last 3 Encounters:  03/03/18 157 lb (71.2 kg)   01/05/18 154 lb 8 oz (70.1 kg)  12/22/17 154 lb (69.9 kg)     Physical Exam  Constitutional: She is oriented to person, place, and time. She appears well-developed and well-nourished.  Frail, elderly female walks with a walker.   HENT:  Head: Normocephalic and atraumatic.  Neck: Normal range of motion. Neck supple. No JVD present.  Cardiovascular: Normal rate, regular rhythm, normal heart sounds and intact distal pulses. Exam reveals no gallop and no friction rub.  No murmur heard. Pulmonary/Chest: Effort normal and breath sounds normal. No respiratory distress. She has no wheezes. She has no rales.  Abdominal: Soft. Bowel sounds are normal.  Musculoskeletal: Normal range of motion. She exhibits no edema.  Neurological: She is alert and oriented to person, place, and time.  Skin: Skin is warm and dry.  Psychiatric: She has a normal mood and affect. Her behavior is normal. Judgment and thought content normal.  Vitals reviewed.   ASSESSMENT:    1. Acute pulmonary embolism without acute cor pulmonale, unspecified pulmonary embolism type (HCC)   2. Dizziness   3. Chronic systolic heart failure (HCC)   4. Essential hypertension   5. Mixed hyperlipidemia   6. Advance directive discussed with patient   7. DNR (do not resuscitate)    PLAN:    In order of problems listed above:  Bilateral PE: Found in May after syncopal episode.  Patient to be continued on lifelong anticoagulation unless develops bleeding.  She is on Eliquis. No unusual bleeding.   Dizziness: occ dizziness but pt is aware to pay attention to early symptoms and she sits down on the seat on her rolator walker and it passes. Her daughter says that she is better than she has been in the past.   Chronic systolic CHF: NYHA II. Stable on low dose carvedilol and Entresto. Mobility is restricted by her heart failure, dizziness, and joint issues.   Hypertension: BP well controlled.   CAD: Suspected due to wall motion  abnormalities.  Treating  medically.  No chest pain.  Hyperlipidemia: On pravastatin 40 mg daily.  LDL was 42 in 07/2017. Conitnue current therapy.   DNR: The patient and her daughter are requesting a gold DNR form. The patient feels strongly that at her age and with her heart failure, she does not want to be resuscitated if her heart stops. She has a MOST form with DNR designation. I have filled out and provided a GOLD DNR form. She wants to be able to display it prominently in her home in case EMS is called.   I have also provided a handicap placard request form to take to the [facility].  Medication Adjustments/Labs and Tests Ordered: Current medicines are reviewed at length with the patient today.  Concerns regarding medicines are outlined above. Labs and tests ordered and medication changes are outlined in the patient instructions below:  Patient Instructions  Medication Instructions: Your physician recommends that you continue on your current medications as directed. Please refer to the Current Medication list given to you today.  Labwork: None  Procedures/Testing: None  Follow-Up: Your physician wants you to follow-up in: 6 months with Dr. Filbert Schilder will receive a reminder letter in the mail two months in advance. If you don't receive a letter, please call our office to schedule the follow-up appointment.   Any Additional Special Instructions Will Be Listed Below (If Applicable).     If you need a refill on your cardiac medications before your next appointment, please call your pharmacy.      Signed, Berton Bon, NP  03/03/2018 5:02 PM    [facility]

## 2018-04-16 ENCOUNTER — Telehealth: Payer: Self-pay | Admitting: Internal Medicine

## 2018-04-16 DIAGNOSIS — R42 Dizziness and giddiness: Secondary | ICD-10-CM

## 2018-04-16 NOTE — Telephone Encounter (Signed)
Spoke with the patient's daughter, advised the patient to stay hydrated and to call if anything worsens. The patient accepted the BMET and CBC on 10/28. She expressed understanding and had no further questions.

## 2018-04-16 NOTE — Telephone Encounter (Signed)
Error

## 2018-04-16 NOTE — Telephone Encounter (Signed)
Patients daughter texted me Said her mom was getting hair doone  Got a little light headed   No syncope Was under hair dryer REcomm Stay hydrated. WOuld recomm repeat CBC and BMET Follow clinically   Call if recurs

## 2018-04-16 NOTE — Addendum Note (Signed)
Addended by: Dustin Flock on: 04/16/2018 04:39 PM   Modules accepted: Orders

## 2018-04-19 ENCOUNTER — Other Ambulatory Visit: Payer: Medicare HMO | Admitting: *Deleted

## 2018-04-19 DIAGNOSIS — R42 Dizziness and giddiness: Secondary | ICD-10-CM

## 2018-04-20 LAB — CBC
Hematocrit: 38.9 % (ref 34.0–46.6)
Hemoglobin: 12.8 g/dL (ref 11.1–15.9)
MCH: 30.9 pg (ref 26.6–33.0)
MCHC: 32.9 g/dL (ref 31.5–35.7)
MCV: 94 fL (ref 79–97)
PLATELETS: 250 10*3/uL (ref 150–450)
RBC: 4.14 x10E6/uL (ref 3.77–5.28)
RDW: 12.1 % — ABNORMAL LOW (ref 12.3–15.4)
WBC: 7 10*3/uL (ref 3.4–10.8)

## 2018-04-20 LAB — BASIC METABOLIC PANEL
BUN/Creatinine Ratio: 17 (ref 12–28)
BUN: 17 mg/dL (ref 10–36)
CO2: 21 mmol/L (ref 20–29)
Calcium: 9 mg/dL (ref 8.7–10.3)
Chloride: 105 mmol/L (ref 96–106)
Creatinine, Ser: 1.01 mg/dL — ABNORMAL HIGH (ref 0.57–1.00)
GFR, EST AFRICAN AMERICAN: 56 mL/min/{1.73_m2} — AB (ref >59)
GFR, EST NON AFRICAN AMERICAN: 48 mL/min/{1.73_m2} — AB (ref >59)
Glucose: 94 mg/dL (ref 65–99)
POTASSIUM: 3.8 mmol/L (ref 3.5–5.2)
SODIUM: 143 mmol/L (ref 134–144)

## 2018-05-12 DIAGNOSIS — R8271 Bacteriuria: Secondary | ICD-10-CM | POA: Diagnosis not present

## 2018-05-12 DIAGNOSIS — N200 Calculus of kidney: Secondary | ICD-10-CM | POA: Diagnosis not present

## 2018-06-18 ENCOUNTER — Other Ambulatory Visit: Payer: Self-pay | Admitting: Internal Medicine

## 2018-06-18 NOTE — Telephone Encounter (Signed)
Pt is a 84 yr old female who saw NP on 03/03/18, wt at that visit was 71.2Kg. Last SCr on 04/19/18 was 1.01, will refill Eliquis 5mg  BID.

## 2018-07-13 ENCOUNTER — Telehealth: Payer: Self-pay | Admitting: Family Medicine

## 2018-07-13 NOTE — Telephone Encounter (Signed)
Form placed in Dr. Duncan's In Box. 

## 2018-07-13 NOTE — Telephone Encounter (Signed)
Pt daughter dropped off ppw for pt to receive spousal VA benefits to be filled out by PCP. Last OV 01/05/18. June dropped off stamped envelope and is requesting it to be mailed when completed and call back. I placed in Rx tower.

## 2018-07-15 NOTE — Telephone Encounter (Signed)
I'll work on the hard copy when possible.  Thanks.  

## 2018-07-18 ENCOUNTER — Other Ambulatory Visit: Payer: Self-pay | Admitting: Internal Medicine

## 2018-07-20 NOTE — Telephone Encounter (Signed)
Dr. Para March requested a 30 min OV because the OV back in July did not cover some of the information requested on the ppw.  Appointment scheduled.

## 2018-07-27 ENCOUNTER — Encounter: Payer: Self-pay | Admitting: Family Medicine

## 2018-07-27 ENCOUNTER — Ambulatory Visit (INDEPENDENT_AMBULATORY_CARE_PROVIDER_SITE_OTHER): Payer: Medicare HMO | Admitting: Family Medicine

## 2018-07-27 DIAGNOSIS — R269 Unspecified abnormalities of gait and mobility: Secondary | ICD-10-CM

## 2018-07-27 DIAGNOSIS — D509 Iron deficiency anemia, unspecified: Secondary | ICD-10-CM

## 2018-07-27 DIAGNOSIS — M25569 Pain in unspecified knee: Secondary | ICD-10-CM

## 2018-07-27 DIAGNOSIS — I5022 Chronic systolic (congestive) heart failure: Secondary | ICD-10-CM

## 2018-07-27 MED ORDER — FERROUS SULFATE 325 (65 FE) MG PO TABS: 325.0000 mg | ORAL_TABLET | ORAL | Status: DC

## 2018-07-27 NOTE — Patient Instructions (Signed)
Don't change your meds for now.  No labs today.  Update me as needed.  Take care.  Glad to see you.

## 2018-07-27 NOTE — Progress Notes (Signed)
Knee pain.  Voltaren gel didn't help her L knee pain.  She has been using campho-phenique with some relief.    Family is helping with medicine organization.    History of CHF.  155 lbs home weight.  This is stable.  Sleeping on one pillow.  No lower extremity edema.  No chest pain.  IDA.  Still on Eliquis and iron.  Able to tolerate both, if taking iron every other day.  Not yet due for follow-up labs.  No gross blood in stool.  No bleeding otherwise.  She had a flu shot done in August 2019.  Gait abnormality.  Discussed with patient about her situation.  She needs assistance/supervision with showers in case of need.  She has a known fall risk.  She is using a walker at baseline.  She has normal strength and sensation in her upper extremities.  She has normal range of motion in her lower extremities but she does have some bilateral quad weakness, lower, both on exam.  She uses a walker anytime she is ambulating.  No recent falls.  Paperwork done for patient at office visit and original copy given back to her.  She has had some rhinorrhea that Flonase did not help.  Nasal saline did not help.  She is not had any fevers.  She is basically putting up with it.  She needs a parking sticker done, form filled out and given to patient.  PMH and SH reviewed  ROS: Per HPI unless specifically indicated in ROS section   Meds, vitals, and allergies reviewed.   GEN: nad, alert and oriented HEENT: mucous membranes moist NECK: supple w/o LA CV: rrr. PULM: ctab, no inc wob ABD: soft, +bs EXT: no edema SKIN: Well-perfused Walking with walker.  Normal posture. Normal strength and sensation in the bilateral upper extremities.  Normal range of motion of the bilateral upper extremities.  Normal strength and sensation in the lower extremities except for quad weakness on hip flexion bilaterally, symmetric. See scanned forms.

## 2018-07-29 DIAGNOSIS — R269 Unspecified abnormalities of gait and mobility: Secondary | ICD-10-CM | POA: Insufficient documentation

## 2018-07-29 NOTE — Assessment & Plan Note (Signed)
With quad weakness noted bilaterally.  She has done PT before.  Reasonable to continue home exercises as tolerated and continue using a walker with all ambulation.  Parking sticker done.  See scanned forms.  At this point still okay for outpatient follow-up.  Update me as needed.  She agrees with plan. >25 minutes spent in face to face time with patient, >50% spent in counselling or coordination of care.

## 2018-07-29 NOTE — Assessment & Plan Note (Signed)
Continue as is.  We can check routine labs at her cardiology visit.  I can put in the orders I want and they can get whatever else they need at the visit.  I will try to minimize blood draws.

## 2018-07-29 NOTE — Assessment & Plan Note (Signed)
Appears euvolemic.  She has cardiology follow-up pending.  I will defer for now.  No change in meds.

## 2018-07-29 NOTE — Assessment & Plan Note (Signed)
She is treating this with Campho-Phenique and that makes sense to continue as that was providing as much relief as anything else.

## 2018-08-05 ENCOUNTER — Telehealth: Payer: Self-pay | Admitting: Internal Medicine

## 2018-08-05 NOTE — Telephone Encounter (Signed)
Patient's daughter called Pt did OK today   Ate dinner Tonight not feeling good  BP 102/60  P 65   Sat 93$   HA, a little naugsea. Had syncopal spell about 20 sec Has had before  Felt a little better after  Went to bed  Daughter staying with her

## 2018-09-06 ENCOUNTER — Telehealth: Payer: Self-pay | Admitting: Internal Medicine

## 2018-09-06 NOTE — Telephone Encounter (Signed)
Spoke to daughter Pt OK    Told her we would call back to define 3/30 appt as we get closer  To it

## 2018-09-15 ENCOUNTER — Telehealth: Payer: Self-pay | Admitting: Internal Medicine

## 2018-09-15 NOTE — Telephone Encounter (Signed)
Spoke to pt's daughter Pt has appt on Monday   With pandemic will need to postpoine to later this spring (April/May) May be able to do evisity Pt does need labs though

## 2018-09-16 NOTE — Telephone Encounter (Signed)
Appointment cancelled. Sent to r/s pool to plan next appointment. Will route back to Dr. Tenny Craw for further advice regarding which labs and when they are needed.

## 2018-09-20 ENCOUNTER — Ambulatory Visit: Payer: Medicare HMO | Admitting: Internal Medicine

## 2018-09-24 ENCOUNTER — Other Ambulatory Visit: Payer: Self-pay | Admitting: Family Medicine

## 2018-09-24 MED ORDER — FERROUS SULFATE 325 (65 FE) MG PO TABS: 325.0000 mg | ORAL_TABLET | ORAL | Status: DC

## 2018-09-24 MED ORDER — FERROUS SULFATE 325 (65 FE) MG PO TABS: 325.0000 mg | ORAL_TABLET | Freq: Every day | ORAL | Status: DC

## 2018-09-24 NOTE — Addendum Note (Signed)
Addended by: Shon Millet on: 09/24/2018 03:34 PM   Modules accepted: Orders

## 2018-09-24 NOTE — Telephone Encounter (Signed)
I would recomm labs that are on order in epic to be done at end of April I do not think they will do at [facility]

## 2018-09-28 NOTE — Telephone Encounter (Signed)
When patient is contacted to reschedule appointment, we will need to arrange for her to come in for labs prior to virtual visit with provider, or same day labs if face to face visit in office.

## 2018-10-16 ENCOUNTER — Other Ambulatory Visit: Payer: Self-pay | Admitting: Internal Medicine

## 2018-10-26 ENCOUNTER — Telehealth: Payer: Self-pay

## 2018-10-26 NOTE — Telephone Encounter (Signed)
Left message for pt's daughter to ask about her medications. Fax from [facility]. Form on Lugene's desk.

## 2018-11-05 NOTE — Telephone Encounter (Signed)
[name] daughter  Best number [phone]  Pt was not able to go to cardiology 3/30 due to covid  Her meds are exactly the same as at her appointment with dr Delorise Royals

## 2018-11-05 NOTE — Telephone Encounter (Signed)
Things are doing good for ms [name] per june

## 2018-11-09 NOTE — Telephone Encounter (Signed)
Noted. Thanks.

## 2018-11-10 ENCOUNTER — Telehealth: Payer: Self-pay | Admitting: Family Medicine

## 2018-11-10 NOTE — Telephone Encounter (Signed)
error 

## 2018-11-16 ENCOUNTER — Ambulatory Visit (INDEPENDENT_AMBULATORY_CARE_PROVIDER_SITE_OTHER): Payer: Medicare HMO | Admitting: Family Medicine

## 2018-11-16 DIAGNOSIS — R5383 Other fatigue: Secondary | ICD-10-CM

## 2018-11-16 DIAGNOSIS — R269 Unspecified abnormalities of gait and mobility: Secondary | ICD-10-CM

## 2018-11-16 DIAGNOSIS — D509 Iron deficiency anemia, unspecified: Secondary | ICD-10-CM

## 2018-11-16 DIAGNOSIS — I10 Essential (primary) hypertension: Secondary | ICD-10-CM | POA: Diagnosis not present

## 2018-11-16 MED ORDER — FERROUS SULFATE 325 (65 FE) MG PO TABS: 325.0000 mg | ORAL_TABLET | ORAL | Status: DC

## 2018-11-16 NOTE — Progress Notes (Signed)
Virtual visit completed through WebEx or similar program Patient location: home  Provider location: [place] at [facility], office   Limitations and rationale for visit method d/w patient.  Patient agreed to proceed.   CC: follow up.    HPI:  Vitals from today: BP 138/80, 98.1 temp, p62, 95% RA, RR 18, AM weight 153 lbs today.    Patient has facility RN on the call today.    "Family wanted me to tell you that I was weak."  Family wanted patient to get started back with PT.  She is walking some at the facility but with restrictions in place due to pandemic.  She does have gait abnormality to begin with, and she uses a walker.  Deconditioning has been an issue for the patient in the past and she has previously responded to physical therapy.  She does have some fatigue.  History of iron deficiency anemia.  Due for recheck labs.  Still taking iron Monday Wednesday Friday.  Compliant.  She has to quarantine in the room since she was out of the facility over the weekend.    History of hypertension.  No CP, not SOB, she has some BLE edema recently noted.  PMH and SH reviewed  ROS: Per HPI unless specifically indicated in ROS section   Meds, vitals, and allergies reviewed.   Meds and allergies reviewed.   ROS: Per HPI unless specifically indicated in ROS section   NAD Speech wnl  A/P:  History of iron deficiency anemia.  Due for recheck labs.  Still taking iron Monday Wednesday Friday.  Compliant with iron Complete metabolic panel, CBC with differential, iron level. Dx. D50.9  Gait abnormality.  Restart physical therapy.  Goal for fall risk reduction.  Fatigue.  Reasonable to check TSH also.  See notes on labs.  Hypertension.  Blood pressure controlled.  No chest pain.  Would monitor peripheral edema. No emergent symptoms identified at this point.

## 2018-11-17 ENCOUNTER — Telehealth: Payer: Self-pay | Admitting: *Deleted

## 2018-11-17 ENCOUNTER — Encounter: Payer: Self-pay | Admitting: Family Medicine

## 2018-11-17 DIAGNOSIS — R5383 Other fatigue: Secondary | ICD-10-CM | POA: Insufficient documentation

## 2018-11-17 DIAGNOSIS — Z7689 Persons encountering health services in other specified circumstances: Secondary | ICD-10-CM | POA: Insufficient documentation

## 2018-11-17 NOTE — Assessment & Plan Note (Signed)
Blood pressure controlled.  No chest pain.  Would monitor peripheral edema. No emergent symptoms identified at this point. See notes on labs.

## 2018-11-17 NOTE — Telephone Encounter (Signed)
Victorino Dike nurse at [facility] left a voicemail stating that they did a virtual visit with you yesterday. Victorino Dike stated that you were going to send them an orders for lab work and PT. Victorino Dike stated that her fax number is [phone]. Victorino Dike stated that she also faxed over orders on 11/12/18 to be signed and faxed back and she has not received that back yet. Victorino Dike requested that these orders be faxed over to her.

## 2018-11-17 NOTE — Assessment & Plan Note (Signed)
Gait abnormality.  Restart physical therapy.  Goal for fall risk reduction.

## 2018-11-17 NOTE — Telephone Encounter (Signed)
I did not forget this.  I will sign off on the last portion of the paperwork tomorrow at clinic.  That will cover the medicine list that was previously faxed over to [facility].  I did a letter for the PT and lab orders.  That is now in the EMR.  Please go ahead and send that over. Thanks.

## 2018-11-17 NOTE — Assessment & Plan Note (Signed)
Fatigue.  Reasonable to check TSH also.  See notes on labs.

## 2018-11-17 NOTE — Telephone Encounter (Signed)
Faxing order for PT and lab work, and pending other form. thanks

## 2018-11-17 NOTE — Assessment & Plan Note (Signed)
History of iron deficiency anemia.  Due for recheck labs.  Still taking iron Monday Wednesday Friday.  Compliant with iron Complete metabolic panel, CBC with differential, iron level. Dx. D50.9

## 2018-11-19 ENCOUNTER — Encounter: Payer: Self-pay | Admitting: Family Medicine

## 2018-11-19 DIAGNOSIS — E039 Hypothyroidism, unspecified: Secondary | ICD-10-CM | POA: Diagnosis not present

## 2018-11-19 DIAGNOSIS — D509 Iron deficiency anemia, unspecified: Secondary | ICD-10-CM | POA: Diagnosis not present

## 2018-11-19 DIAGNOSIS — D649 Anemia, unspecified: Secondary | ICD-10-CM | POA: Diagnosis not present

## 2018-11-19 DIAGNOSIS — Z79899 Other long term (current) drug therapy: Secondary | ICD-10-CM | POA: Diagnosis not present

## 2018-11-19 LAB — BASIC METABOLIC PANEL
Creatinine: 0.8 (ref 0.5–1.1)
Glucose: 79
Potassium: 4 (ref 3.4–5.3)
Sodium: 141 (ref 137–147)

## 2018-11-19 LAB — HEPATIC FUNCTION PANEL
ALT: 19 (ref 7–35)
AST: 25 (ref 13–35)

## 2018-11-19 LAB — TSH: TSH: 2.73 (ref 0.41–5.90)

## 2018-11-19 LAB — CBC AND DIFFERENTIAL: Hemoglobin: 13.3 (ref 12.0–16.0)

## 2018-11-19 LAB — IRON,TIBC AND FERRITIN PANEL: Iron: 87

## 2018-11-22 DIAGNOSIS — I5022 Chronic systolic (congestive) heart failure: Secondary | ICD-10-CM | POA: Diagnosis not present

## 2018-11-22 DIAGNOSIS — R269 Unspecified abnormalities of gait and mobility: Secondary | ICD-10-CM | POA: Diagnosis not present

## 2018-11-22 DIAGNOSIS — R2681 Unsteadiness on feet: Secondary | ICD-10-CM | POA: Diagnosis not present

## 2018-11-25 ENCOUNTER — Telehealth: Payer: Self-pay | Admitting: Family Medicine

## 2018-11-25 NOTE — Telephone Encounter (Signed)
Disregard this message.  Results are now available.  Thanks.

## 2018-11-25 NOTE — Telephone Encounter (Signed)
Please check with the facility about getting her labs resulted.  She was going to have labs drawn at her residence. Thanks.

## 2018-11-26 ENCOUNTER — Telehealth: Payer: Self-pay

## 2018-11-26 NOTE — Telephone Encounter (Signed)
Melida Gimenez, RN advised. Waiting for daughter June to call back.

## 2018-11-26 NOTE — Telephone Encounter (Signed)
My understanding is that all of this was addressed.  Thanks.

## 2018-11-26 NOTE — Telephone Encounter (Signed)
Please sign and close encounter if completed.  

## 2018-11-26 NOTE — Telephone Encounter (Signed)
Dr. Para March was the form from a week ago or so/order done on this patient? I did not want to close in case this was not done. Thank you

## 2018-11-26 NOTE — Telephone Encounter (Signed)
Left message for patient's daughter, June to call back, per Dr. Para March received lab results on patient from [facility]. Labs are fine. No change in meds yet. Dr. Para March wanted to get an update on patient.  Also called [facility] and left message for Melida Gimenez, RN to call back to be advised of lab result feedback and to see if they need that report faxed over back to them with comments. (Also abstracted results in the chart per Dr. Lianne Bushy advise)

## 2018-11-26 NOTE — Telephone Encounter (Signed)
Best number [phone]  Victorino Dike returned your call

## 2018-11-29 NOTE — Telephone Encounter (Addendum)
Daughter (June) says her mother is weak, sleeps a lot and is depressed.  Daughter says that she intermittently checks her out of the facility and brings her to her house to try to alleviate some of the depression.  June says that when she is at her home, the patient stays up till 10 pm or so with stimulation.  At the facility, she asks for her dinner to be delivered at 4 pm and many times goes to bed after that and sleep until the next morning around 9 AM which June believes is a sign of depression.  Patient is supposed to be starting some physical therapy soon.

## 2018-11-30 NOTE — Telephone Encounter (Signed)
We need to involve the patient in this conversation.  Is there a way to set up a phone visit using a three-way phone call with the daughter and the patient?  If not, then I need to get a visit set up with the patient via phone so we can talk about these concerns.  Thanks.

## 2018-12-02 NOTE — Telephone Encounter (Signed)
Pt is scheduled for virtual visit on 12/03/18 @ 2pm

## 2018-12-03 ENCOUNTER — Ambulatory Visit (INDEPENDENT_AMBULATORY_CARE_PROVIDER_SITE_OTHER): Payer: Medicare HMO | Admitting: Family Medicine

## 2018-12-03 DIAGNOSIS — Z7689 Persons encountering health services in other specified circumstances: Secondary | ICD-10-CM

## 2018-12-03 NOTE — Progress Notes (Signed)
Virtual visit completed through WebEx or similar program Patient location: home  Provider location: Financial controller at [facility], office   Pandemic considerations d/w pt.   Limitations and rationale for visit method d/w patient.  Patient agreed to proceed.   CC: follow up.   HPI:   Sleep changes and also significant social changes noted with the pandemic.  She has done better at her daughter's house and then does better shortly thereafter after returning to [facility], meaning her sleep cycle is more normalized when she is at her daughter's house and eventually she starts going to bed earlier when she is back at her facility.  Recent labs are stable and weight is stable at 155 lbs.  Boredom may be an issue for patient.   Her facility is beginning to consider loosening restrictions and that may be a significant improvement for the patient.  She is getting ready to start physical therapy.  We talked about her current medications.  It is not obvious at this point that any of her medications are causing an adverse reaction that would affect her mood or otherwise.  We specifically discussed her statin use.  It may make sense to continue at this point given that she may benefit from plaque stabilization.  Meds and allergies reviewed.   ROS: Per HPI unless specifically indicated in ROS section   NAD Speech wnl  A/P: Sleep changes.  Discussed. Inc social interaction at [facility] and PT may be the best options for patient.  No changes in meds at this point.  Update me as needed.  Patient and daughter agree.

## 2018-12-05 NOTE — Assessment & Plan Note (Signed)
Sleep changes.  Discussed. Inc social interaction at [facility] and PT may be the best options for patient.  No changes in meds at this point.  Update me as needed.  Patient and daughter agree.

## 2018-12-17 ENCOUNTER — Other Ambulatory Visit: Payer: Self-pay | Admitting: Internal Medicine

## 2018-12-17 MED ORDER — ENTRESTO 24-26 MG PO TABS: 1.0000 | ORAL_TABLET | Freq: Two times a day (BID) | ORAL | 0 refills | Status: DC

## 2018-12-17 NOTE — Addendum Note (Signed)
Addended by: Derl Barrow on: 12/17/2018 11:34 AM   Modules accepted: Orders

## 2018-12-21 ENCOUNTER — Telehealth: Payer: Self-pay | Admitting: *Deleted

## 2018-12-21 NOTE — Telephone Encounter (Signed)
Left message for pt's daughter, June to call back to discuss pt's next visit. Had been scheduled 3/30 but was cancelled.

## 2018-12-22 DIAGNOSIS — R2681 Unsteadiness on feet: Secondary | ICD-10-CM | POA: Diagnosis not present

## 2018-12-22 DIAGNOSIS — I5022 Chronic systolic (congestive) heart failure: Secondary | ICD-10-CM | POA: Diagnosis not present

## 2018-12-22 DIAGNOSIS — R269 Unspecified abnormalities of gait and mobility: Secondary | ICD-10-CM | POA: Diagnosis not present

## 2019-01-12 ENCOUNTER — Other Ambulatory Visit: Payer: Self-pay | Admitting: Internal Medicine

## 2019-01-12 MED ORDER — CARVEDILOL 3.125 MG PO TABS: 3.1250 mg | ORAL_TABLET | Freq: Two times a day (BID) | ORAL | 0 refills | Status: DC

## 2019-01-12 NOTE — Addendum Note (Signed)
Addended by: Jones Broom on: 01/12/2019 11:54 AM   Modules accepted: Orders

## 2019-01-25 ENCOUNTER — Other Ambulatory Visit: Payer: Self-pay | Admitting: Internal Medicine

## 2019-01-25 MED ORDER — ENTRESTO 24-26 MG PO TABS: 1.0000 | ORAL_TABLET | Freq: Two times a day (BID) | ORAL | 0 refills | Status: DC

## 2019-01-25 NOTE — Telephone Encounter (Signed)
Pt's medication was sent to pt's pharmacy as requested. Confirmation received.  °

## 2019-02-11 ENCOUNTER — Other Ambulatory Visit: Payer: Self-pay | Admitting: Internal Medicine

## 2019-03-31 NOTE — Progress Notes (Addendum)
Cardiology Office Note   Date:  04/01/2019   ID:  [name], DOB 03/31/1924, MRN [id number]  PCP:  Joaquim Nam, MD  Cardiologist:   Dietrich Pates, MD   F/U of  PE and chronic systolic CHF     History of Present Illness: [name] is a 85 y.o. female with a history of HTN, syncope, HL, OSA, anemia  systolic CHF LVEF 30 to 35%     She also has a history of bilateral PE  On Eliquis  HX DV  I saw the pt in May  2019  She was seen by Cleotis Nipper after that  She lives at [facility]   Spends a few days per month with daughter  She denies CP  Breathing is OK Is unsteady on feet      Current Meds  Medication Sig  . acetaminophen (TYLENOL) 500 MG tablet Take 500 mg by mouth as needed for mild pain.   . camphor-phenol (CAMPHO-PHENIQUE) 10.8-4.7 % LIQD Apply 1 application topically daily as needed for mild pain or itching.  . carvedilol (COREG) 3.125 MG tablet Take 1 tablet (3.125 mg total) by mouth 2 (two) times daily with a meal. Please keep upcoming appt for future refills. Thank you  . cholecalciferol (VITAMIN D) 1000 UNITS tablet Take 1,000 Units by mouth daily.   Marland Kitchen ELIQUIS 5 MG TABS tablet TAKE ONE TABLET BY MOUTH TWICE A DAY  . ferrous sulfate 325 (65 FE) MG tablet Take 1 tablet (325 mg total) by mouth every Monday, Wednesday, and Friday.  . pravastatin (PRAVACHOL) 40 MG tablet TAKE ONE TABLET BY MOUTH EVERY EVENING  . Propylene Glycol (SYSTANE BALANCE OP) Apply 1 drop to eye as needed (dry eyes).  . sacubitril-valsartan (ENTRESTO) 24-26 MG Take 1 tablet by mouth 2 (two) times daily. Please keep upcoming appt in October with Dr. Tenny Craw for future refills. Thank you     Allergies:   Codeine, Levofloxacin, Neospect [dyphylline-ephed-pb-gg or], Spironolactone, Tramadol, and Neosporin [neomycin-bacitracin zn-polymyx]   Past Medical History:  Diagnosis Date  . AK (actinic keratosis)   . Allergy   . Chronic dermatitis    face, allergic to many products  . GERD  (gastroesophageal reflux disease)   . Hyperlipidemia   . Hypertension   . Kidney stones   . Overactive bladder   . Pulmonary embolism (HCC)   . Sleep apnea   . Syncope     Past Surgical History:  Procedure Laterality Date  . broken nose    . COLONOSCOPY  2004     Social History:  The patient  reports that she has quit smoking. Her smoking use included cigarettes. She has never used smokeless tobacco. She reports that she does not drink alcohol or use drugs.   Family History:  The patient's family history includes Cancer in her mother; Coronary artery disease in her father.    ROS:  Please see the history of present illness. All other systems are reviewed and  Negative to the above problem except as noted.    PHYSICAL EXAM: VS:  BP 134/74   Pulse 79   Ht 5\' 2"  (1.575 m)   Wt 154 lb 1.9 oz (69.9 kg)   BMI 28.19 kg/m   GEN: Pt is overwt 85 yo in no acute distress   Examined in wheelchair HEENT:  Ecchymoses to face   Neck: no JVD, carotid bruits, or masses Cardiac: RRR; no murmurs, rubs, or gallops,Trivial edema  Respiratory:  clear to auscultation bilaterally, normal work of breathing GI: soft, nontender, nondistended, + BS  No hepatomegaly  MS: no deformity Moving all extremities   Skin: warm and dry, no rash Neuro:  Strength and sensation are intact Psych: euthymic mood, full affect   EKG:  EKG is ordered today.  SR 79 bpm   Occasional PVC    LVH with repol abnorm    ANteriolateral MI   Infeiror MI     Lipid Panel    Component Value Date/Time   CHOL 118 07/27/2017 1303   TRIG 109 07/27/2017 1303   HDL 54 07/27/2017 1303   CHOLHDL 2.2 07/27/2017 1303   CHOLHDL 3 05/09/2011 0955   VLDL 20.6 05/09/2011 0955   LDLCALC 42 07/27/2017 1303      Wt Readings from Last 3 Encounters:  04/01/19 154 lb 1.9 oz (69.9 kg)  12/03/18 155 lb (70.3 kg)  11/16/18 153 lb (69.4 kg)      ASSESSMENT AND PLAN:  1  Chronic systolic CHF  VOlume os overalll OK   Keep on same  meds   2   Hx PE  Keep on ELiquis   FOllow H/H     3  CAD   Pt without complaints of angina  4   HL  Continue statin     Check:  CBC, BMET, LIpids today    F/U in March    Current medicines are reviewed at length with the patient today.  The patient does not have concerns regarding medicines.  Signed, Dorris Carnes, MD  04/01/2019 1:54 PM    [facility] [place], [place],   [address] Phone: [phone]; Fax: [phone]

## 2019-04-01 ENCOUNTER — Other Ambulatory Visit: Payer: Self-pay

## 2019-04-01 ENCOUNTER — Ambulatory Visit: Payer: Medicare HMO | Admitting: Internal Medicine

## 2019-04-01 ENCOUNTER — Encounter (INDEPENDENT_AMBULATORY_CARE_PROVIDER_SITE_OTHER): Payer: Self-pay

## 2019-04-01 ENCOUNTER — Encounter: Payer: Self-pay | Admitting: Internal Medicine

## 2019-04-01 VITALS — BP 134/74 | HR 79 | Ht 62.0 in | Wt 154.1 lb

## 2019-04-01 DIAGNOSIS — E782 Mixed hyperlipidemia: Secondary | ICD-10-CM | POA: Diagnosis not present

## 2019-04-01 DIAGNOSIS — I5022 Chronic systolic (congestive) heart failure: Secondary | ICD-10-CM

## 2019-04-01 NOTE — Patient Instructions (Signed)
Medication Instructions:  No changes If you need a refill on your cardiac medications before your next appointment, please call your pharmacy.   Lab work: Today: cbc, bmet, lipids If you have labs (blood work) drawn today and your tests are completely normal, you will receive your results only by: Marland Kitchen MyChart Message (if you have MyChart) OR . A paper copy in the mail If you have any lab test that is abnormal or we need to change your treatment, we will call you to review the results.  Testing/Procedures: none  Follow-Up: At [facility], you and your health needs are our priority.  As part of our continuing mission to provide you with exceptional heart care, we have created designated Provider Care Teams.  These Care Teams include your primary Cardiologist (physician) and Advanced Practice Providers (APPs -  Physician Assistants and Nurse Practitioners) who all work together to provide you with the care you need, when you need it. You will need a follow up appointment in:  5 months.  Please call our office 2 months in advance to schedule this appointment.  You may see Dorris Carnes, MD or one of the following Advanced Practice Providers on your designated Care Team: Richardson Dopp, PA-C [place], [place] . Daune Perch, NP  Any Other Special Instructions Will Be Listed Below (If Applicable).

## 2019-04-02 LAB — CBC
Hematocrit: 40.1 % (ref 34.0–46.6)
Hemoglobin: 12.8 g/dL (ref 11.1–15.9)
MCH: 29.2 pg (ref 26.6–33.0)
MCHC: 31.9 g/dL (ref 31.5–35.7)
MCV: 92 fL (ref 79–97)
Platelets: 244 10*3/uL (ref 150–450)
RBC: 4.38 x10E6/uL (ref 3.77–5.28)
RDW: 11.8 % (ref 11.7–15.4)
WBC: 6.6 10*3/uL (ref 3.4–10.8)

## 2019-04-02 LAB — BASIC METABOLIC PANEL
BUN/Creatinine Ratio: 16 (ref 12–28)
BUN: 15 mg/dL (ref 10–36)
CO2: 22 mmol/L (ref 20–29)
Calcium: 9.1 mg/dL (ref 8.7–10.3)
Chloride: 110 mmol/L — ABNORMAL HIGH (ref 96–106)
Creatinine, Ser: 0.94 mg/dL (ref 0.57–1.00)
GFR calc Af Amer: 60 mL/min/{1.73_m2} (ref >59)
GFR calc non Af Amer: 52 mL/min/{1.73_m2} — ABNORMAL LOW (ref >59)
Glucose: 88 mg/dL (ref 65–99)
Potassium: 3.9 mmol/L (ref 3.5–5.2)
Sodium: 146 mmol/L — ABNORMAL HIGH (ref 134–144)

## 2019-04-02 LAB — LIPID PANEL
Chol/HDL Ratio: 2.4 ratio (ref 0.0–4.4)
Cholesterol, Total: 136 mg/dL (ref 100–199)
HDL: 57 mg/dL (ref >39)
LDL Chol Calc (NIH): 61 mg/dL (ref 0–99)
Triglycerides: 98 mg/dL (ref 0–149)
VLDL Cholesterol Cal: 18 mg/dL (ref 5–40)

## 2019-04-12 ENCOUNTER — Other Ambulatory Visit: Payer: Self-pay | Admitting: Internal Medicine

## 2019-04-13 NOTE — Telephone Encounter (Signed)
Pt last saw Dr Harrington Challenger 04/01/19, last labs 04/01/19 Creat 0.94, age 85, weight 69.9kg, based on specified criteria pt is on appropriate dosage of Eliquis 5mg  BID.  Will refill rx.

## 2019-05-12 ENCOUNTER — Other Ambulatory Visit: Payer: Self-pay | Admitting: Internal Medicine

## 2019-05-26 ENCOUNTER — Other Ambulatory Visit: Payer: Self-pay | Admitting: Internal Medicine

## 2019-05-26 ENCOUNTER — Telehealth: Payer: Self-pay

## 2019-05-26 NOTE — Telephone Encounter (Signed)
This was delayed with the holiday.  I'm trying to work on paperwork as quickly as I can.  Form done. Thanks.

## 2019-05-26 NOTE — Telephone Encounter (Signed)
Faxed

## 2019-05-26 NOTE — Telephone Encounter (Signed)
Phoebe Perch, nurse with [facility], left message on triage line stating they faxed over orders on the patient for Dr Damita Dunnings to sign on 11/23 and 11/30 and have not had any response so far. Please fax orders back to [phone]. Jennifer's CB is [phone]. Thank you

## 2019-05-31 ENCOUNTER — Telehealth: Payer: Self-pay

## 2019-05-31 NOTE — Telephone Encounter (Signed)
[place] pharmacist at [facility] left v/m that she gave pt a Fluad quadrivalent flu shot for senior citizens on 05/31/19.Baldo Ash will also fax info to [facility].

## 2019-07-01 ENCOUNTER — Telehealth: Payer: Self-pay | Admitting: Family Medicine

## 2019-07-01 NOTE — Telephone Encounter (Signed)
Patient's Daughter June is requesting a call back She stated that the patient is doing well but wanted to speak with you about her mental health

## 2019-07-01 NOTE — Telephone Encounter (Signed)
Noted, will d/w pt them at [facility].  Thanks.

## 2019-07-01 NOTE — Telephone Encounter (Signed)
I spoke with June and pt lives at [facility] in [place]. Pt has been there for 2 1/2 yrs. Pt is in independent living and [facility] has been covid free. Pt came to 06/02/19 to [facility] to stay with families. Now [facility] has 50 cases of covid. June has seen decline since end of Oct. Pt is forgetful, mind is slipping and cannot dress herself now. Pt is not eating like she used to.   June is not sure pt can begin to take care of herself; since pt has worsened and may need assistance June said that Dr Para March may have to redo papers for [facility] for finicail assistance. If Dr Para March were to ask pt to get up and walk then pt will have problems.Pt is not going back to [facility] due to covid until covid is under control there. Pt will need reaccessment for pts care if continues to decline. Yesterday pt went to  Restroom; pt not in any pain and did see bright red blood when wiped on tissue. June wondered if could be hemorrhoid; has not seen blood today. Pt has  irregular BMs. June and her sisters are talking about possible virtual visit with Dr Para March. Pt has seen Dr Tenny Craw and pt seemed pretty alert at end of Oct. But pt has definitely seen a change mentally and physically.Today June sister's helped pt with bath and did not see any evidence of any bleeding today. June scheduled 45' virtual appt 07/05/19 at [facility]. ED precautions given and June voiced understanding but they have decided they will not be taking pt to ED; June said pt is "13". FYI to Dr Para March.

## 2019-07-01 NOTE — Telephone Encounter (Signed)
Please see what detail you can get and let me know.  Thanks.

## 2019-07-05 ENCOUNTER — Telehealth: Payer: Self-pay | Admitting: *Deleted

## 2019-07-05 ENCOUNTER — Ambulatory Visit (INDEPENDENT_AMBULATORY_CARE_PROVIDER_SITE_OTHER): Payer: Medicare HMO | Admitting: Family Medicine

## 2019-07-05 ENCOUNTER — Other Ambulatory Visit: Payer: Self-pay

## 2019-07-05 DIAGNOSIS — Z209 Contact with and (suspected) exposure to unspecified communicable disease: Secondary | ICD-10-CM

## 2019-07-05 DIAGNOSIS — R5383 Other fatigue: Secondary | ICD-10-CM | POA: Diagnosis not present

## 2019-07-05 DIAGNOSIS — Z20822 Contact with and (suspected) exposure to covid-19: Secondary | ICD-10-CM | POA: Diagnosis not present

## 2019-07-05 DIAGNOSIS — D509 Iron deficiency anemia, unspecified: Secondary | ICD-10-CM

## 2019-07-05 NOTE — Telephone Encounter (Signed)
Cam/patient's daughter left a voicemail stating that they had finished a virtual visit with Dr. Para March today and it was very good. Cam stated that they discussed patient omitting her Eliquis for 3 days. Cam stated that when she was discussing this with her sisters the subject came up that her mom had some problems passing out and having fainting spells a couple of years back. Cam stated that she would like to discuss this further.

## 2019-07-05 NOTE — Progress Notes (Signed)
Virtual visit completed through WebEx or similar program Patient location: home  Provider location: Adult nurse at [facility], office   Pandemic considerations d/w pt.   Limitations and rationale for visit method d/w patient.  Patient/daughter agreed to proceed.   CC: memory change.   HPI:  She has been staying with [place] with family.    "I am feel pretty good but I'm awfully slow and I'm not used to that."  Daughter is helping more with moving around the home, bathing, dressing.  She has noted change with her memory also.  This is a change from her recent baseline.  Short term memory has been affected, per family.  She also had some troubles recalling distant relatives.  Some days she is going to bed at 6 or 630 PM.  Sometimes she has been sleeping in later in the day and that is a change, ie fatigued.   [facility] to [facility] to get some [facility].  I can fill out paperwork for this if needed.  She had an episode of rectal bleeding last week but none in the meantime.  She has had some black stools reportedly but this could be due to iron use.  We talked about prev social isolation about COVID.   No CP, no recently difference in her breathing.  She had a recent exposure to covid and she has no sx currently.  The last contact was 07/03/19.    Meds and allergies reviewed.   ROS: Per HPI unless specifically indicated in ROS section   NAD Speech wnl  A/P:  Recent exposure to Covid.  Reasonable for observation at this point.  No symptoms currently.  Pulse ox recently 94.   Family can check her pulse ox/BP/pulse and update me.  Fatigue, short-term memory change, less active.  No focal neurologic symptoms.  Has family support.  We talked about trying to avoid emergency room/hospital visit.  We can mail her a lab order slip and they may be able to get that done at a drive up facility and have the labs faxed to me.  Letter printed and sent to patient C/o Sharen Counter [address] [place] [address]  Episode of rectal bleed.  We talked about holding Eliquis.  If she is having ongoing GI bleed then it makes sense to hold.  If her black stools are only related to iron use then she may be able to continue for now.  See follow-up phone notes.  Family requested a copy of her DNR be sent to [facility].  Paperwork filled out and sent with lab request.   37 minutes spent on the video conference with the patient and her daughter.

## 2019-07-05 NOTE — Telephone Encounter (Signed)
Called, no answer, LMOVM w/o leaving confidential info asking her to call back to the clinic with more info tomorrow.  Please route the message to me when she calls back.  Thanks.

## 2019-07-06 ENCOUNTER — Telehealth: Payer: Self-pay | Admitting: Internal Medicine

## 2019-07-06 NOTE — Telephone Encounter (Signed)
Thanks

## 2019-07-06 NOTE — Telephone Encounter (Signed)
See other phone note.  If the dark stools are related to iron use, and if this is not an acute change otherwise, then it may still be reasonable to continue Eliquis in the meantime assuming her labs can be checked in the near future.  This is also assuming she does not have further bleeding.  We did overnight a lab slip to her address in [place] to get her labs checked.  I thank all involved.

## 2019-07-06 NOTE — Telephone Encounter (Signed)
Called pt's daughter  Pt has had no further bleeding since one episode of BRBPR last week   She says taht her mother's bowel movements have always been dark because of Fe use and are unchanged. It does not appear that Eliquis was stopped because of hx PE Pt had massive PE in 2019  Inciting event not found  I told her that is not unreasonable but that CBC needs to be checked quickly  She said orders are being sent by Dr Lianne Bushy office to check.   NOt sure when they will bring her back to [place]>   She lives at [facility].   When returns I would recomm hospice consult to assist in keeping out of skilled nursing areas.

## 2019-07-06 NOTE — Telephone Encounter (Addendum)
I get the point about the concern for stopping Eliquis.  The main reason to stop the Eliquis was not just the one episode of bright red blood per rectum.  It was reported that she was recurrently having black tarry stools.  That (in combination with her recent status change with fatigue/etc.) was the main reason to stop Eliquis in the meantime.  I still think it makes sense to get her labs done.  We overnighted a lab slip to her address in [place].  I will route this to Dr. Tenny Craw for input in the meantime.  I thank all involved.  ======= Addendum-see other note from Dr. Tenny Craw.  I thank all involved.

## 2019-07-06 NOTE — Telephone Encounter (Signed)
[name] pts daughter called with update for pt;based on virtual visit on 07/05/19 pt was advised to stop eliquis. Once before about 2 years ago stopped eliquis and pt had PE. Pt has had no fainting or dizziness since put back on eliquis. [name] would like for Dr Para March to talk with Dr Tenny Craw cardiologist before stopping Eliquis. [name] feels like bleeding last wk might have been an isolated incident and is concerned about stopping the eliquis.  [name] request cb after Dr Para March reviews.

## 2019-07-06 NOTE — Telephone Encounter (Signed)
New Message  Pt's daughter called and stated Dr. Para March in [facility] wants Dr. Tenny Craw to take her off some of her medications. Her mother is experiencing rectal bleeding. Dr. Para March is recommending that she comes off of Eliquis. Last time she came off medication, she had a pulmonary embolism. She wants to check with Dr. Tenny Craw first before she comes off medication.  Please call to discuss

## 2019-07-06 NOTE — Telephone Encounter (Signed)
Independent living at [facility]. Went to [facility] Dec 9 for holidays. Meanwhile Covid outbreak at [place] and so she is staying in [place] a few more weeks.   Last week had one episode of bright red blood rectally.  Never had before.  No pain.  No other episodes since.   The toilet paper was bright red with a nickel-sized dark clot in toilet.  Toilet water was pink.   Had video visit with PCP.  The rectal bleeding was reported and pt was instructed to stop Eliquis.   Concerned because in May 2019 she was taken off Eliquis because she was dizzy and fainting.  Developed PE few weeks later.  Would like for Dr. Tenny Craw to be aware and would appreciate her input.  A different daughter did the video visit and did not mention the PE to PCP.  I rec that she contact PCP today and make sure he is aware of this information.  Lab work is being arranged via PCP per daughter.  Adv that I would forward to Dr. Tenny Craw to make her aware.

## 2019-07-06 NOTE — Telephone Encounter (Signed)
June notified as instructed and voiced understanding. Pt has a doctor in [place] as well. June will let her sister in [place] know the plan and June will cb when needed. FYI to Dr Duncan.  

## 2019-07-06 NOTE — Telephone Encounter (Signed)
June notified as instructed and voiced understanding. Pt has a doctor in [place] as well. June will let her sister in [place] know the plan and June will cb when needed. FYI to Dr Para March.

## 2019-07-07 ENCOUNTER — Encounter: Payer: Self-pay | Admitting: Family Medicine

## 2019-07-07 DIAGNOSIS — Z209 Contact with and (suspected) exposure to unspecified communicable disease: Secondary | ICD-10-CM | POA: Insufficient documentation

## 2019-07-07 NOTE — Assessment & Plan Note (Signed)
Recent exposure to Covid.  Reasonable for observation at this point.  No symptoms currently.  Pulse ox recently 94.   Family can check her pulse ox/BP/pulse and update me.

## 2019-07-07 NOTE — Assessment & Plan Note (Signed)
Fatigue, short-term memory change, less active.  No focal neurologic symptoms.  Has family support.  We talked about trying to avoid emergency room/hospital visit.  We can mail her a lab order slip and they may be able to get that done at a drive up facility and have the labs faxed to me.  Letter printed and sent to patient C/o [name] [address] [place] [address]  Episode of rectal bleed.  We talked about holding Eliquis.  If she is having ongoing GI bleed then it makes sense to hold.  If her black stools are only related to iron use then she may be able to continue for now.  See follow-up phone notes.  Family requested a copy of her DNR be sent to [facility].  Paperwork filled out and sent with lab request.

## 2019-07-11 NOTE — Telephone Encounter (Signed)
Cam pts daughter (DPR signed) said on 07/10/19 pt had another episode of weakness, and a glazed stare that lasted longer than usual; pt has not had episode in a while and this episode last 45 - 50'.pt was not unconscious but kept that blank glazed stare and spoke few words during the episode. Cam another sister and niece helped pt back to bed and pt slept for a while. After resting pt could talk and ate supper. Cam had intended to take pt for labs today but was concerned that pt might be dehydrated and has been encouraging liquids today and plans to take pt for labs on 07/12/19. June the daughter that lives here let Dr Tenny Craw know about episode yesterday and Dr Tenny Craw advised any further episode of weakness, SOB or glazed episode pt should go for eval at ED. Today pt is acting her normal self except pt is weak. Dr Para March agreed with instructions from Dr Tenny Craw and would like for pt to have labs ASAP. Cam said she will take pt for labs on 07/12/19 and will follow the instructions given by Dr Tenny Craw and Dr Para March. Cam very appreciative of the excellent care her mother gets from our office. FYI to Dr Para March.

## 2019-07-12 NOTE — Telephone Encounter (Signed)
Noted.  Thanks.  Will await the lab report/update from family.

## 2019-07-13 ENCOUNTER — Other Ambulatory Visit: Payer: Self-pay | Admitting: Family Medicine

## 2019-07-13 DIAGNOSIS — D509 Iron deficiency anemia, unspecified: Secondary | ICD-10-CM | POA: Diagnosis not present

## 2019-07-13 DIAGNOSIS — R5383 Other fatigue: Secondary | ICD-10-CM | POA: Diagnosis not present

## 2019-07-14 LAB — COMPREHENSIVE METABOLIC PANEL
ALT: 10 IU/L (ref 0–32)
AST: 15 IU/L (ref 0–40)
Albumin/Globulin Ratio: 1.6 (ref 1.2–2.2)
Albumin: 3.5 g/dL (ref 3.5–4.6)
Alkaline Phosphatase: 65 IU/L (ref 39–117)
BUN/Creatinine Ratio: 16 (ref 12–28)
BUN: 12 mg/dL (ref 10–36)
Bilirubin Total: 0.3 mg/dL (ref 0.0–1.2)
CO2: 21 mmol/L (ref 20–29)
Calcium: 8.6 mg/dL — ABNORMAL LOW (ref 8.7–10.3)
Chloride: 108 mmol/L — ABNORMAL HIGH (ref 96–106)
Creatinine, Ser: 0.75 mg/dL (ref 0.57–1.00)
GFR calc Af Amer: 79 mL/min/{1.73_m2} (ref >59)
GFR calc non Af Amer: 69 mL/min/{1.73_m2} (ref >59)
Globulin, Total: 2.2 g/dL (ref 1.5–4.5)
Glucose: 119 mg/dL — ABNORMAL HIGH (ref 65–99)
Potassium: 3.6 mmol/L (ref 3.5–5.2)
Sodium: 143 mmol/L (ref 134–144)
Total Protein: 5.7 g/dL — ABNORMAL LOW (ref 6.0–8.5)

## 2019-07-14 LAB — CBC/DIFF AMBIGUOUS DEFAULT
Basophils Absolute: 0 10*3/uL (ref 0.0–0.2)
Basos: 1 %
EOS (ABSOLUTE): 0.3 10*3/uL (ref 0.0–0.4)
Eos: 6 %
Hematocrit: 37.7 % (ref 34.0–46.6)
Hemoglobin: 11.9 g/dL (ref 11.1–15.9)
Immature Grans (Abs): 0 10*3/uL (ref 0.0–0.1)
Immature Granulocytes: 0 %
Lymphocytes Absolute: 1.8 10*3/uL (ref 0.7–3.1)
Lymphs: 36 %
MCH: 29.6 pg (ref 26.6–33.0)
MCHC: 31.6 g/dL (ref 31.5–35.7)
MCV: 94 fL (ref 79–97)
Monocytes Absolute: 0.6 10*3/uL (ref 0.1–0.9)
Monocytes: 12 %
Neutrophils Absolute: 2.2 10*3/uL (ref 1.4–7.0)
Neutrophils: 45 %
Platelets: 243 10*3/uL (ref 150–450)
RBC: 4.02 x10E6/uL (ref 3.77–5.28)
RDW: 12.8 % (ref 11.7–15.4)
WBC: 5 10*3/uL (ref 3.4–10.8)

## 2019-07-14 LAB — TSH: TSH: 2.79 u[IU]/mL (ref 0.450–4.500)

## 2019-07-14 LAB — IRON: Iron: 32 ug/dL (ref 27–139)

## 2019-07-19 ENCOUNTER — Telehealth: Payer: Self-pay | Admitting: Internal Medicine

## 2019-07-19 ENCOUNTER — Telehealth: Payer: Self-pay | Admitting: *Deleted

## 2019-07-19 ENCOUNTER — Encounter: Payer: Self-pay | Admitting: Family Medicine

## 2019-07-19 LAB — LAB REPORT - SCANNED
ALT: 10 (ref 3–30)
AST: 15
Creatinine, Ser: 0.75
Glucose: 119
Hemoglobin: 11.9
Iron: 32
Platelets: 243
TSH: 2.79
WBC: 5

## 2019-07-19 NOTE — Telephone Encounter (Signed)
Daughter, June, was advised of lab results. Spoke to June (daughter) who states that after the patient's last virtual visit with Dr. Para March, while patient was in [place], patient experienced another one of her "episodes" that she has had in the past.  This consists of what seems to be a seizure.  The previous episodes have only lasted a minute or so at a time and have been only a few times a year.  However, this episode lasted about 45 minutes.  Patient can still communicate and memory seems ok but patient stays in bed most of the day, except for maybe 3 hours or so.  Daughter has contacted Dr. Tenny Craw to get an assessment from Hospice as the patient cannot go back to the assisted living facility at this point because she requires more care than what she can get at that facility.  Patient is supposed to be returning to [facility] tomorrow.

## 2019-07-19 NOTE — Telephone Encounter (Signed)
New Message   Patients daughter is calling as instructed by Dr. Tenny Craw. She states that Dr. Tenny Craw is to set her mother up with a hospice evaluation.

## 2019-07-19 NOTE — Telephone Encounter (Signed)
Yes, I would put in a consult for hospice for pt

## 2019-07-20 NOTE — Telephone Encounter (Signed)
Noted.  Thanks.  I will await updates otherwise.

## 2019-07-20 NOTE — Telephone Encounter (Signed)
I called nad spoke with the patients daughter June and she stated the patient had arrived about 1 hour ago and she is doing fine and the ride here she did great and she stated the cardiologist was putting in the referral and she wanted me to thank you for everything and I informed her if she needed anything to let us know and she understood.  Trust Leh,cma

## 2019-07-20 NOTE — Telephone Encounter (Signed)
Please check on patient with pending arrival back to [place].  If I need to put in a hospice referral then I can do so.  If this is already been handled through cardiology then I will defer.  When patients are accepted into the hospice program, I typically follow along concurrently with the hospice MDs.  Please let me know if I can do anything to help in the meantime.  Thanks.

## 2019-07-21 NOTE — Telephone Encounter (Signed)
Contacted [employer], contracting at [phone] and did a referral for patient for hospice eval.  Provided daughter [name]'s contact information.  They will reach out to her to arrange.  I left vm on same daughter's phone updating her that the referral has been placed and to call if any further questions or concerns re: this.

## 2019-07-22 NOTE — Telephone Encounter (Signed)
Pt's daughter called asking that no one call [facility] for the patient. The patient is living with her. I put that information int Active FYIs

## 2019-07-31 ENCOUNTER — Telehealth: Payer: Self-pay | Admitting: Family Medicine

## 2019-07-31 NOTE — Telephone Encounter (Signed)
Patient is living at home with family.  Please get update on patient.  Thanks.

## 2019-08-01 NOTE — Telephone Encounter (Signed)
Noted. Thanks.

## 2019-08-01 NOTE — Telephone Encounter (Signed)
June (daughter) says [name] is doing fair to good.  She is settling in and enjoying the sunroom.  Hospice is coming in twice a week.   Last week was busy because all of the components of Hospice was getting established but this week seems to be a good chance to get settled into a routine.

## 2019-08-09 ENCOUNTER — Telehealth: Payer: Self-pay | Admitting: Internal Medicine

## 2019-08-09 NOTE — Telephone Encounter (Signed)
New Message   Shavonne from Hospice is calling and says she has faxed over a comfort care order  She says all 3 pages of the order will need to be signed    Please advise

## 2019-08-11 NOTE — Telephone Encounter (Signed)
Detailed message left for Shavonne that the papers have been received and have been given to Dr. Tenny Craw to review/sign and we will fax back to her when Dr. Tenny Craw returns to office since office closed today due to weather.

## 2019-08-11 NOTE — Telephone Encounter (Signed)
  Vito Berger is calling to f/u regarding comfort orders she had faxed over

## 2019-08-15 ENCOUNTER — Telehealth: Payer: Self-pay | Admitting: Internal Medicine

## 2019-08-15 NOTE — Telephone Encounter (Signed)
[name] from [facility] is following up on comfort care papers that are to be faxed over to their office. Please advise.

## 2019-08-15 NOTE — Telephone Encounter (Signed)
Checked with medical records, signed papers are being faxed at this time.

## 2019-08-15 NOTE — Telephone Encounter (Signed)
Called to inform [name].  She has received them.

## 2019-08-30 IMAGING — CT CT RENAL STONE PROTOCOL
2 of 4 series · 16 of 46 positions shown, 18 images · non-contrast
Comparison: Abdominal ultrasound 07/12/2008.  Chest CT 07/20/2008.

CLINICAL DATA: Urinary tract infection without hematuria. Left
groin pain. Evaluate for kidney stones.

EXAM:
CT ABDOMEN AND PELVIS WITHOUT CONTRAST
TECHNIQUE: Multidetector CT imaging of the abdomen and pelvis was performed
following the standard protocol without IV contrast.

[Series 2: stone study 5.0 i30f 1 · axial · 0.73mm/px · z∈[-432,-77]mm · 13 of 79 slices shown, 15 images]
[im 4/79  soft-tissue]
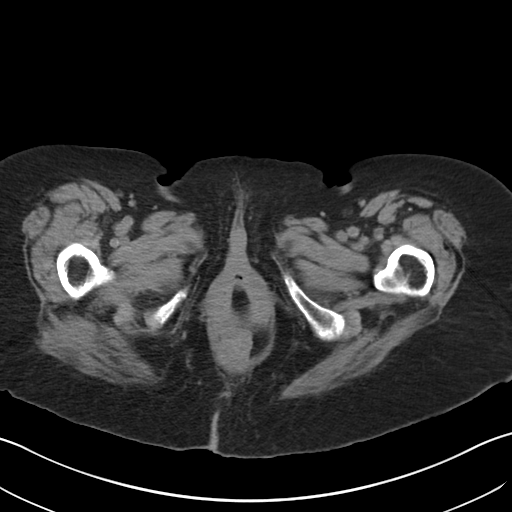
[im 4/79  bone]
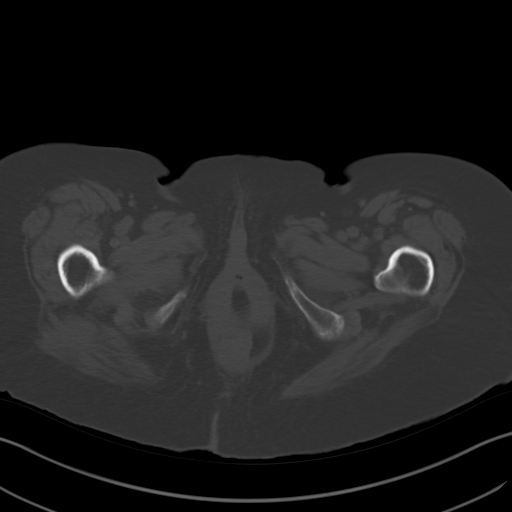
[im 10/79  soft-tissue]
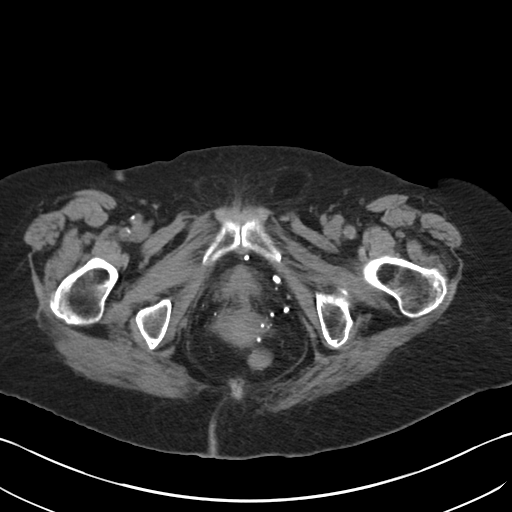
[im 16/79  soft-tissue]
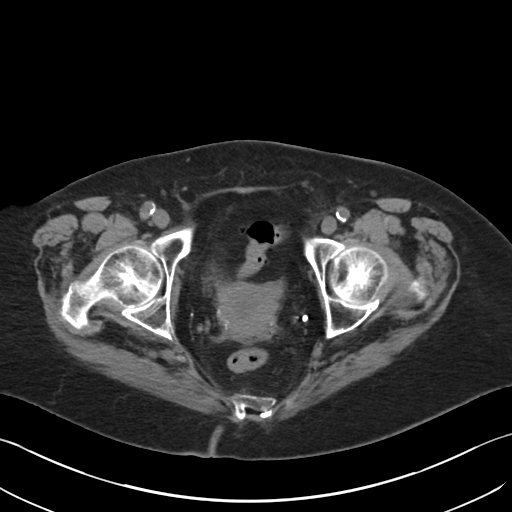
[im 22/79  soft-tissue]
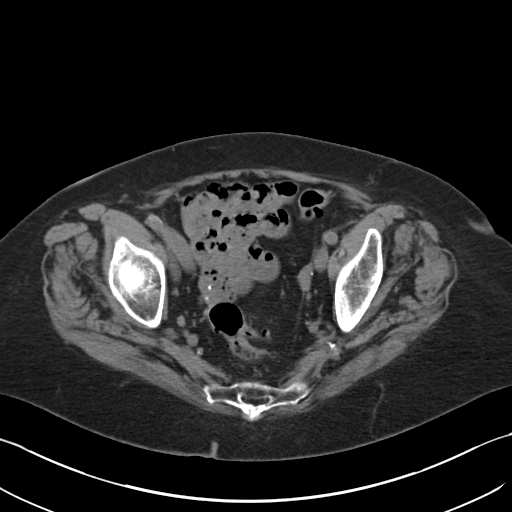
[im 29/79  soft-tissue]
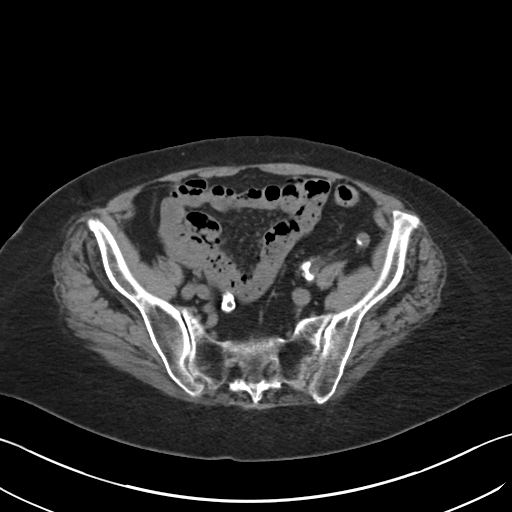
[im 35/79  soft-tissue]
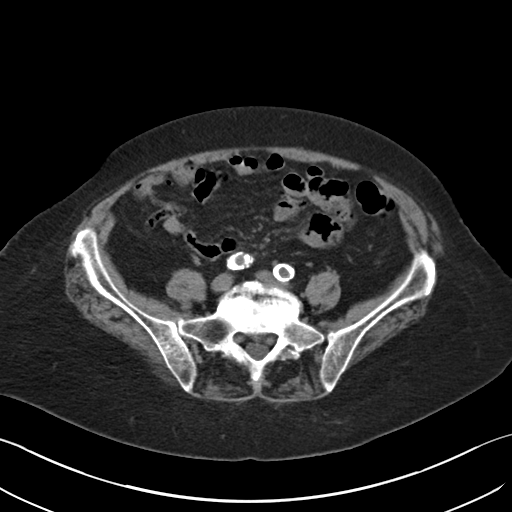
[im 41/79  soft-tissue]
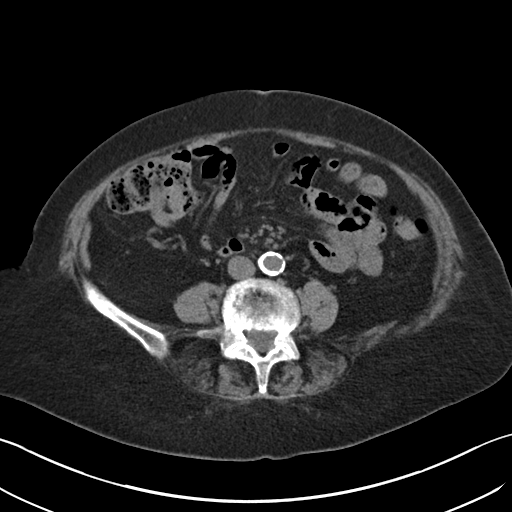
[im 44/79  soft-tissue]
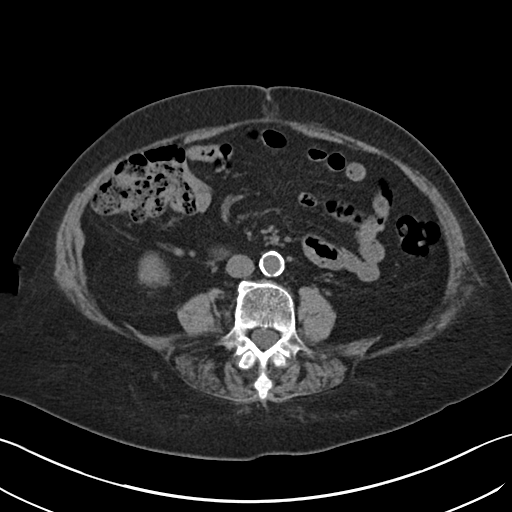
[im 50/79  soft-tissue]
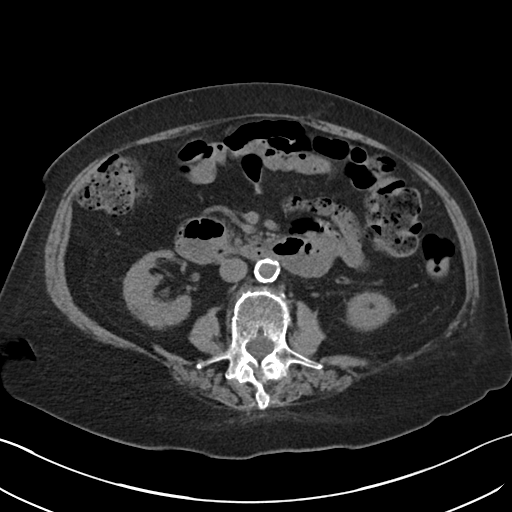
[im 50/79  bone]
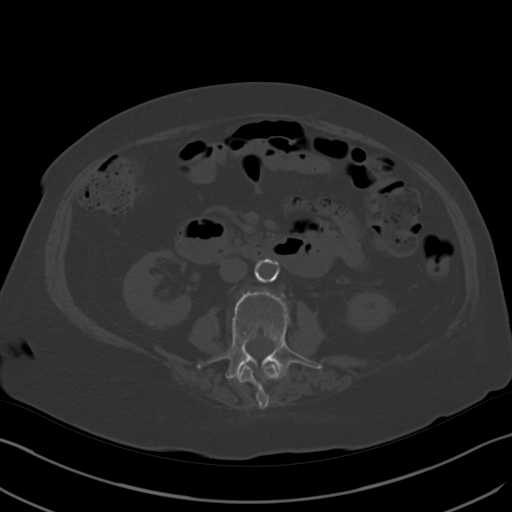
[im 57/79  soft-tissue]
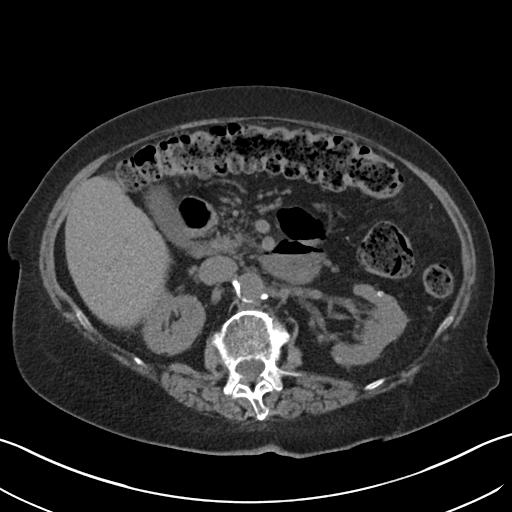
[im 63/79  soft-tissue]
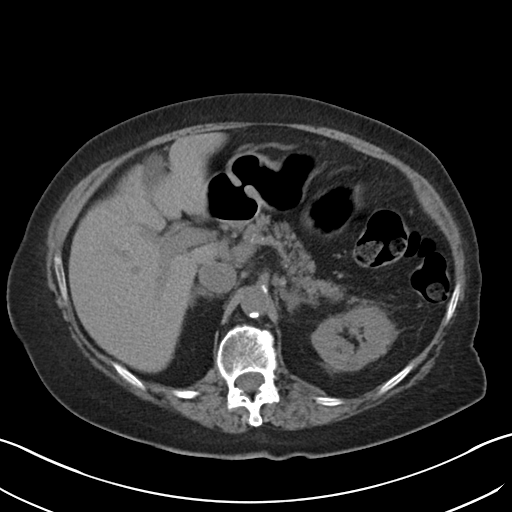
[im 69/79  soft-tissue]
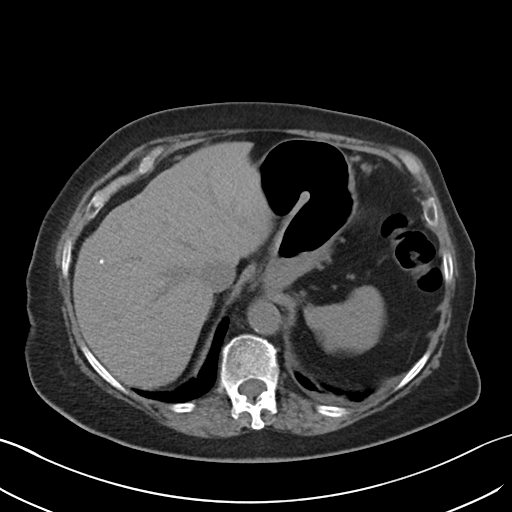
[im 75/79  soft-tissue]
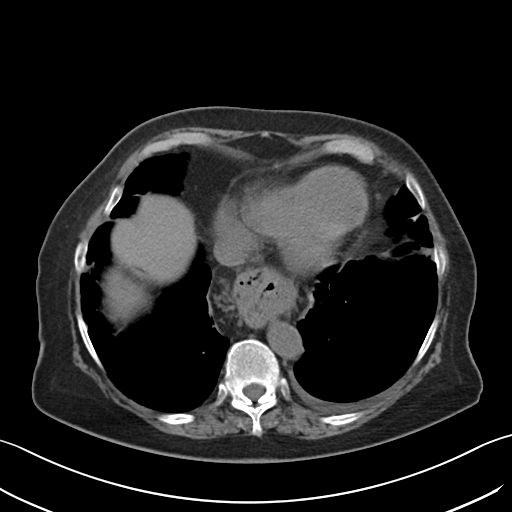

[Series 5: coronal soft tissue · coronal · 0.71mm/px · 3 of 75 slices shown]
[im 25/75  soft-tissue]
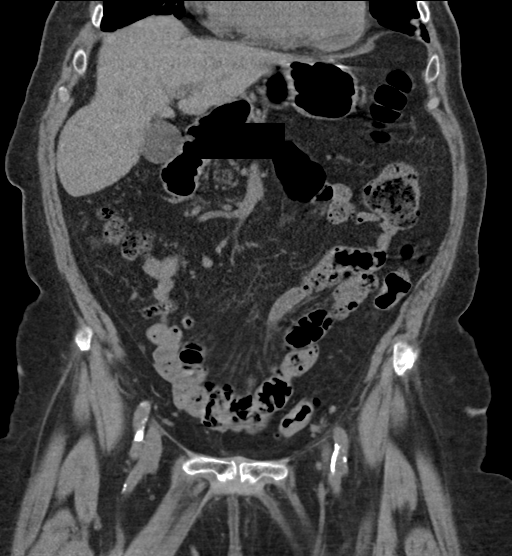
[im 33/75  soft-tissue]
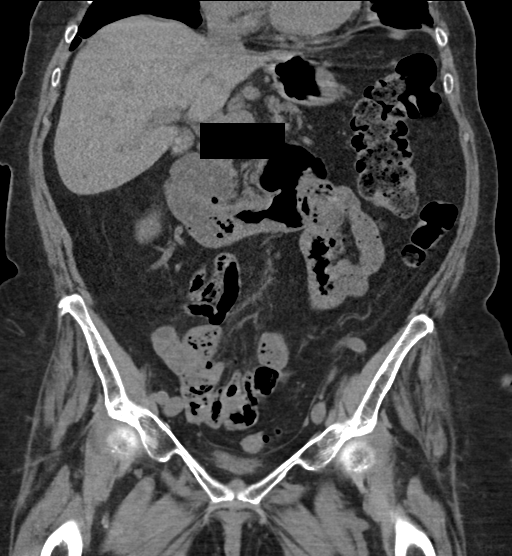
[im 42/75  soft-tissue]
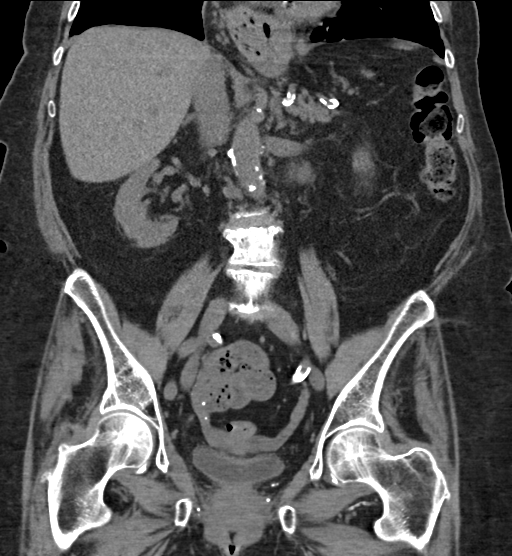

[16 of 46 positions shown; findings below may reference images not displayed]

FINDINGS: Lower chest: There is a small left pleural effusion and a
moderate-size hiatal hernia. There is mild scarring and scattered
calcified granulomas of both lung bases.

Hepatobiliary: Aside from a small calcified granuloma, no focal
hepatic abnormalities. There are small dependent gallstones in the
gallbladder lumen. No evidence of gallbladder wall thickening or
biliary dilatation.

Pancreas: Unremarkable. No pancreatic ductal dilatation or
surrounding inflammatory changes.

Spleen: Normal in size without focal abnormality.

Adrenals/Urinary Tract: Both adrenal glands appear normal. There is
bilateral nephrolithiasis. The largest calculi are in the lower
poles of both kidneys, measuring 8 mm on the right (image 31) and 8
mm on the left (image 28). No evidence of ureteral or bladder
calculus. There is no hydronephrosis or perinephric soft tissue
stranding. Both kidneys demonstrate cortical thinning. There are
probable small bilateral renal cysts. The bladder appears
unremarkable for its degree of distention.

Stomach/Bowel: No evidence of bowel wall thickening, distention or
surrounding inflammatory change. As above, moderate-size hiatal
hernia. The appendix appears normal. There are mild distal colonic
diverticular changes.

Vascular/Lymphatic: There are no enlarged abdominal or pelvic lymph
nodes. Diffuse aortic and branch vessel atherosclerosis.

Reproductive: The uterus and ovaries appear normal. No evidence of
adnexal mass.

Other: Small umbilical hernia containing only fat. There is
asymmetric fat in the left inguinal canal. No ascites.

Musculoskeletal: No acute or significant osseous findings. There are
degenerative changes throughout the lumbar spine associated with a
convex left scoliosis.
IMPRESSION: 1. Bilateral nephrolithiasis. No evidence of ureteral calculus or
hydronephrosis.
2. No evidence of complicated urinary tract infection. There is no
perinephric or perivesical soft tissue stranding.
3. Additional incidental findings including scarring at both lung
bases, cholelithiasis, probable bilateral renal cysts, moderate-size
hiatal hernia and diffuse Aortic Atherosclerosis (FLGOM-M86.6).

## 2019-09-26 ENCOUNTER — Telehealth: Payer: Self-pay | Admitting: Internal Medicine

## 2019-09-26 NOTE — Telephone Encounter (Signed)
New Message   [name] is calling and would like too Recertify her for Hospice    Please call

## 2019-09-27 NOTE — Telephone Encounter (Signed)
Spoke with [name].  Pt will be recertified for hospice.  Their medical records dept will send the paperwork to Dr. Tenny Craw to sign.

## 2019-10-10 ENCOUNTER — Telehealth: Payer: Self-pay | Admitting: Family Medicine

## 2019-10-10 NOTE — Telephone Encounter (Signed)
Called and got patient scheduled for CPE and labs. 

## 2019-10-12 ENCOUNTER — Telehealth: Payer: Self-pay

## 2019-10-12 NOTE — Telephone Encounter (Signed)
I am always glad to see [name] and if I can do anything in the meantime that would be useful then please let me know.  Given that she is on hospice I think it makes sense to cancel the Medicare wellness visit/labs.  Thanks.

## 2019-10-12 NOTE — Telephone Encounter (Signed)
Darl Pikes nurse with [facility]; said that pts daughter asked Darl Pikes to call; is not sure if Dr Para March is aware pt is on hospice for cerebrovascular disease; in 05/2019 pt was in independent living and had a prolonged seizure; pt is doing OK now but projection pt has 6 months or less. pts daughter wants to know if pt needs to keep 10/14/19 appt with nurse for [facility], 10/25/19 CPX labs and 11/01/19 CPX with Dr Para March. Darl Pikes says a nurse goes out weekly to ck on pt. Dr Dietrich Pates cardiologist is attending for [facility]. Darl Pikes request cb prior to 10/14/19 phone appt.

## 2019-10-13 NOTE — Telephone Encounter (Signed)
Message left for Darl Pikes at [facility] and also spoke with June (daughter).  Appointments were cancelled at [facility] request.

## 2019-10-14 ENCOUNTER — Ambulatory Visit: Payer: Medicare HMO

## 2019-10-25 ENCOUNTER — Other Ambulatory Visit: Payer: Medicare HMO

## 2019-11-01 ENCOUNTER — Encounter: Payer: Medicare HMO | Admitting: Family Medicine

## 2019-11-08 ENCOUNTER — Other Ambulatory Visit: Payer: Self-pay | Admitting: Internal Medicine

## 2019-11-08 NOTE — Telephone Encounter (Signed)
Prescription refill request for Eliquis received.  Last office visit: Tenny Craw, 04/01/2019 Scr: 0.75, 07/13/2019 Age: 86 y.o. Weight: 69.9. kg     Eliquis was dc 07/05/2019 by Dr. Cheree Ditto. Called and spoke to pt's daughter, June, who stated that pt never stopped taking Eliquis and that Dr. Tenny Craw stated to continue taking Eliquis.  Reviewed with pharm D. Prescription refill sent for Eliquis.

## 2019-12-28 ENCOUNTER — Telehealth: Payer: Self-pay | Admitting: Internal Medicine

## 2019-12-28 NOTE — Telephone Encounter (Signed)
Spoke with [name] and informed her of Dr. Charlott Rakes recommendations. She states that she will relay the information to the family so that they can make the decision.

## 2019-12-28 NOTE — Telephone Encounter (Signed)
OK to come off of Entresto She is on eliquis because of hx of PE   Risk is coming off and having another  If risk of fall or bleeding is greater may need to do that

## 2019-12-28 NOTE — Telephone Encounter (Signed)
New Message  [name] calling in from [facility] on the behalf of the patient. Patient's family would like patient to come off of Eliquis and Entresto and would like Dr. Tenny Craw opinion on that. If so, would they need to start an aspirin a day. Please advise. Patient is on hospice care.

## 2019-12-28 NOTE — Telephone Encounter (Signed)
Follow up    [name] called back to follow up, she also wanted to get a verbal order from Dr. Tenny Craw to continue hospice care for pt.

## 2020-02-15 ENCOUNTER — Telehealth: Payer: Self-pay | Admitting: Internal Medicine

## 2020-02-15 NOTE — Telephone Encounter (Signed)
Darl Pikes is calling to inform Dr. Tenny Craw that [name] no longer qualifies for Hospice and she will no longer be under their care as of next week. She states she will be fully under Dr. Charlott Rakes care from there. Darl Pikes states a callback is not necessary unless there are questions. The family has already been made aware of this.

## 2020-02-20 DIAGNOSIS — I252 Old myocardial infarction: Secondary | ICD-10-CM | POA: Diagnosis not present

## 2020-02-20 DIAGNOSIS — E261 Secondary hyperaldosteronism: Secondary | ICD-10-CM | POA: Diagnosis not present

## 2020-02-20 DIAGNOSIS — D649 Anemia, unspecified: Secondary | ICD-10-CM | POA: Diagnosis not present

## 2020-02-20 DIAGNOSIS — M810 Age-related osteoporosis without current pathological fracture: Secondary | ICD-10-CM | POA: Diagnosis not present

## 2020-02-20 DIAGNOSIS — G8929 Other chronic pain: Secondary | ICD-10-CM | POA: Diagnosis not present

## 2020-02-20 DIAGNOSIS — I509 Heart failure, unspecified: Secondary | ICD-10-CM | POA: Diagnosis not present

## 2020-02-20 DIAGNOSIS — G3184 Mild cognitive impairment, so stated: Secondary | ICD-10-CM | POA: Diagnosis not present

## 2020-02-20 DIAGNOSIS — M1990 Unspecified osteoarthritis, unspecified site: Secondary | ICD-10-CM | POA: Diagnosis not present

## 2020-02-20 DIAGNOSIS — E669 Obesity, unspecified: Secondary | ICD-10-CM | POA: Diagnosis not present

## 2020-02-20 DIAGNOSIS — I25119 Atherosclerotic heart disease of native coronary artery with unspecified angina pectoris: Secondary | ICD-10-CM | POA: Diagnosis not present

## 2020-02-20 DIAGNOSIS — Z008 Encounter for other general examination: Secondary | ICD-10-CM | POA: Diagnosis not present

## 2020-02-24 ENCOUNTER — Telehealth: Payer: Self-pay | Admitting: Internal Medicine

## 2020-02-24 NOTE — Telephone Encounter (Signed)
Scheduled visit for 03-06-20 at 9:00.

## 2020-03-06 ENCOUNTER — Other Ambulatory Visit: Payer: Medicare HMO | Admitting: Internal Medicine

## 2020-03-06 ENCOUNTER — Other Ambulatory Visit: Payer: Self-pay

## 2020-03-06 DIAGNOSIS — Z515 Encounter for palliative care: Secondary | ICD-10-CM

## 2020-03-06 DIAGNOSIS — I679 Cerebrovascular disease, unspecified: Secondary | ICD-10-CM

## 2020-04-03 NOTE — Progress Notes (Signed)
Virtual Visit via Telephone Note   This visit type was conducted due to national recommendations for restrictions regarding the COVID-19 Pandemic (e.g. social distancing) in an effort to limit this patient's exposure and mitigate transmission in our community.  Due to her co-morbid illnesses, this patient is at least at moderate risk for complications without adequate follow up.  This format is felt to be most appropriate for this patient at this time.  The patient did not have access to video technology/had technical difficulties with video requiring transitioning to audio format only (telephone).  All issues noted in this document were discussed and addressed.  No physical exam could be performed with this format.  Please refer to the patient's chart for her  consent to telehealth for [facility].    Date:  04/04/2020   ID:  [name], DOB 31-Aug-1923, MRN [id number] The patient was identified using 2 identifiers.  Patient Location: Home Provider Location: Office/Clinic  PCP:  Joaquim Nam, MD  Cardiologist:  Dietrich Pates, MD   Electrophysiologist:  None   Evaluation Performed:  Follow-Up Visit  Chief Complaint:  Follow-up (CHF, CAD)    Patient Profile: [name] is a 86 y.o. female with:  Chronic systolic CHF  Coronary artery UM3+ on CT in 5/19  Likely Ischemic CM >> med Rx only   Echocardiogram 6/17: EF 30-35, aneurysmal dilation of LV apex (likely silent ischemic event), heavy sludge c/w pre-thrombotic state >> Apixaban; Pt and family declined inv workup   Echocardiogram 5/19: EF 30-35  Hypertension   Hyperlipidemia   Hx of syncope   OSA   Anemia   Hx of bilateral pulmonary embolism, DVT in 5/19  Chronic anticoagulation w Apixaban   Aortic atherosclerosis   Hx of seizures  Prior CV Studies: Echocardiogram 11/13/17 Mild LVH, apical and inf AK, ant HK, EF 30-35, trivial MR, mild RAE, mod TR, PASP 54, RV strain   Echocardiogram 11/30/15 EF 30-35,  Gr 1 DD   History of Present Illness:   [name] was last seen by Dr. Tenny Craw in 03/2019.  She is seen for annual follow up.  The patient is currently sleeping.  I spoke with her daughter, [name].  DPR is on file.  The patient was in hospice until recently.  She has had a long history of seizures.  The etiology of this is not entirely clear.  It does not sound like she is having syncope.  These have been ongoing for the last several years.  She had a prolonged seizure in January and again last month.  The seizure last month occurred not long after her daughter passed away.  She was previously on hospice.  However, she had been improving and hospice recently released her.  Her daughter is not sure that this was the best for her.  Since the seizure in September, she seems to be declining.  She has chronic shortness of breath with just activities of daily living.  This is stable for her.  She has not had orthopnea or PND.  She has chronic leg swelling that is fairly well controlled.  She has not had chest pain.  There has not been clear syncope.  Past Medical History:  Diagnosis Date  . AK (actinic keratosis)   . Allergy   . Chronic dermatitis    face, allergic to many products  . GERD (gastroesophageal reflux disease)   . Hyperlipidemia   . Hypertension   . Kidney stones   .  Overactive bladder   . Pulmonary embolism (HCC)   . Sleep apnea   . Syncope    Past Surgical History:  Procedure Laterality Date  . broken nose    . COLONOSCOPY  2004     Current Meds  Medication Sig  . acetaminophen (TYLENOL) 500 MG tablet Take 500 mg by mouth as needed for mild pain.   Marland Kitchen apixaban (ELIQUIS) 5 MG TABS tablet Take 1 tablet (5 mg total) by mouth 2 (two) times daily.  . camphor-phenol (CAMPHO-PHENIQUE) 10.8-4.7 % LIQD Apply 1 application topically daily as needed for mild pain or itching.  . carvedilol (COREG) 3.125 MG tablet Take 1 tablet (3.125 mg total) by mouth 2 (two) times daily with a meal.    . cholecalciferol (VITAMIN D) 1000 UNITS tablet Take 1,000 Units by mouth daily.   Marland Kitchen Propylene Glycol (SYSTANE BALANCE OP) Apply 1 drop to eye as needed (dry eyes).  . sacubitril-valsartan (ENTRESTO) 24-26 MG Take 1 tablet by mouth 2 (two) times daily.  . [DISCONTINUED] carvedilol (COREG) 3.125 MG tablet TAKE ONE TABLET BY MOUTH TWICE A DAY WITH A MEAL  . [DISCONTINUED] ELIQUIS 5 MG TABS tablet TAKE ONE TABLET BY MOUTH TWICE A DAY  . [DISCONTINUED] ENTRESTO 24-26 MG TAKE ONE TABLET BY MOUTH TWICE A DAY     Allergies:   Codeine, Levofloxacin, Neospect [dyphylline-ephed-pb-gg or], Spironolactone, Tramadol, and Neosporin [neomycin-bacitracin zn-polymyx]   Social History   Tobacco Use  . Smoking status: Former Smoker    Types: Cigarettes  . Smokeless tobacco: Never Used  . Tobacco comment: smoked during college  Vaping Use  . Vaping Use: Never used  Substance Use Topics  . Alcohol use: No  . Drug use: No     Family Hx: The patient's family history includes Cancer in her mother; Coronary artery disease in her father. There is no history of Colon cancer.  ROS:   Please see the history of present illness.      Labs/Other Tests and Data Reviewed:    EKG:  No ECG reviewed.  Recent Labs: 07/13/2019: ALT 10; BUN 12; Creatinine, Ser 0.75; Hemoglobin 11.9; Platelets 243; Potassium 3.6; Sodium 143; TSH 2.790   Recent Lipid Panel Lab Results  Component Value Date/Time   CHOL 136 04/01/2019 02:15 PM   TRIG 98 04/01/2019 02:15 PM   HDL 57 04/01/2019 02:15 PM   CHOLHDL 2.4 04/01/2019 02:15 PM   CHOLHDL 3 05/09/2011 09:55 AM   LDLCALC 61 04/01/2019 02:15 PM    Wt Readings from Last 3 Encounters:  04/04/20 156 lb (70.8 kg)  04/01/19 154 lb 1.9 oz (69.9 kg)  12/03/18 155 lb (70.3 kg)     Risk Assessment/Calculations:      Objective:    Vital Signs:  Ht 5\' 2"  (1.575 m)   Wt 156 lb (70.8 kg)   BMI 28.53 kg/m    VITAL SIGNS:  reviewed  ASSESSMENT & PLAN:    1. Chronic  systolic heart failure (HCC) EF 30-35.  She is NYHA IIIb-IV.  She seems to have symptoms of end-stage heart failure.  Overall, she has been fairly stable.  She has not had to use furosemide.  She was on it previously but it was stopped.  She needs her medications refilled.  I will refill her carvedilol and Entresto as well as her Apixaban.  I will also send in a prescription for furosemide 40 mg to take daily as needed for worsening swelling, worsening shortness of breath or increasing weight.  I will get her into see Dr. Tenny Craw in the next 6 weeks for follow-up.  It sounds as though she may need another referral to hospice if she continues to decline.  2. Coronary artery disease involving native coronary artery of native heart without angina pectoris Presumed coronary artery disease and ischemic cardiomyopathy.  She is not having anginal symptoms.  She is not on aspirin as she is on Apixaban.  3. Essential hypertension Her insurance recently came out and did a screening.  Her blood pressures were 125/62-139/78.  Her blood pressure seems to be well controlled.  Continue current therapy.  4. History of pulmonary embolism She remains on chronic anticoagulation with apixaban.  Hemoglobin and creatinine were stable in January 2021.    Time:   Today, I have spent 26 minutes with the patient with telehealth technology discussing the above problems.     Medication Adjustments/Labs and Tests Ordered: Current medicines are reviewed at length with the patient today.  Concerns regarding medicines are outlined above.   Tests Ordered: No orders of the defined types were placed in this encounter.   Medication Changes: Meds ordered this encounter  Medications  . furosemide (LASIX) 40 MG tablet    Sig: Take 1 tablet (40 mg total) by mouth as needed for fluid or edema (shortness of breath or weight gain of 3 pounds or more).    Dispense:  30 tablet    Refill:  6  . carvedilol (COREG) 3.125 MG tablet     Sig: Take 1 tablet (3.125 mg total) by mouth 2 (two) times daily with a meal.    Dispense:  180 tablet    Refill:  3  . apixaban (ELIQUIS) 5 MG TABS tablet    Sig: Take 1 tablet (5 mg total) by mouth 2 (two) times daily.    Dispense:  60 tablet    Refill:  6  . sacubitril-valsartan (ENTRESTO) 24-26 MG    Sig: Take 1 tablet by mouth 2 (two) times daily.    Dispense:  180 tablet    Refill:  3    Follow Up:  In Person in 6 week(s)  Signed, Tereso Newcomer, PA-C  04/04/2020 9:32 AM    [facility]

## 2020-04-04 ENCOUNTER — Other Ambulatory Visit: Payer: Self-pay

## 2020-04-04 ENCOUNTER — Telehealth: Payer: Self-pay | Admitting: *Deleted

## 2020-04-04 ENCOUNTER — Encounter: Payer: Self-pay | Admitting: Physician Assistant

## 2020-04-04 ENCOUNTER — Telehealth (INDEPENDENT_AMBULATORY_CARE_PROVIDER_SITE_OTHER): Payer: Medicare HMO | Admitting: Physician Assistant

## 2020-04-04 VITALS — Ht 62.0 in | Wt 156.0 lb

## 2020-04-04 DIAGNOSIS — I2510 Atherosclerotic heart disease of native coronary artery without angina pectoris: Secondary | ICD-10-CM

## 2020-04-04 DIAGNOSIS — I10 Essential (primary) hypertension: Secondary | ICD-10-CM

## 2020-04-04 DIAGNOSIS — Z86711 Personal history of pulmonary embolism: Secondary | ICD-10-CM | POA: Diagnosis not present

## 2020-04-04 DIAGNOSIS — I5022 Chronic systolic (congestive) heart failure: Secondary | ICD-10-CM | POA: Diagnosis not present

## 2020-04-04 DIAGNOSIS — E782 Mixed hyperlipidemia: Secondary | ICD-10-CM

## 2020-04-04 MED ORDER — FUROSEMIDE 40 MG PO TABS: 40.0000 mg | ORAL_TABLET | ORAL | 6 refills | Status: AC | PRN

## 2020-04-04 MED ORDER — APIXABAN 5 MG PO TABS: 5.0000 mg | ORAL_TABLET | Freq: Two times a day (BID) | ORAL | 6 refills | Status: AC

## 2020-04-04 MED ORDER — ENTRESTO 24-26 MG PO TABS: 1.0000 | ORAL_TABLET | Freq: Two times a day (BID) | ORAL | 3 refills | Status: AC

## 2020-04-04 MED ORDER — CARVEDILOL 3.125 MG PO TABS: 3.1250 mg | ORAL_TABLET | Freq: Two times a day (BID) | ORAL | 3 refills | Status: DC

## 2020-04-04 NOTE — Patient Instructions (Addendum)
Medication Instructions:  Your physician has recommended you make the following change in your medication:   1) Start Lasix 40 mg, 1 tablet by mouth as needed for increased swelling, increased shortness of breath, or weight gain greater than 3 pounds  *If you need a refill on your cardiac medications before your next appointment, please call your pharmacy*  Lab Work: None ordered today  If you have labs (blood work) drawn today and your tests are completely normal, you will receive your results only by: Marland Kitchen MyChart Message (if you have MyChart) OR . A paper copy in the mail If you have any lab test that is abnormal or we need to change your treatment, we will call you to review the results.  Testing/Procedures: None ordered today  Follow-Up: On 05/28/20 at 1:20PM with Dietrich Pates, MD

## 2020-04-04 NOTE — Telephone Encounter (Signed)
°  Patient Consent for Virtual Visit         [name] has provided verbal consent on 04/04/2020 for a virtual visit (video or telephone).   CONSENT FOR VIRTUAL VISIT FOR:  [name]  By participating in this virtual visit I agree to the following:  I hereby voluntarily request, consent and authorize [facility] and its employed or contracted physicians, physician assistants, nurse practitioners or other licensed health care professionals (the Practitioner), to provide me with telemedicine health care services (the Services") as deemed necessary by the treating Practitioner. I acknowledge and consent to receive the Services by the Practitioner via telemedicine. I understand that the telemedicine visit will involve communicating with the Practitioner through live audiovisual communication technology and the disclosure of certain medical information by electronic transmission. I acknowledge that I have been given the opportunity to request an in-person assessment or other available alternative prior to the telemedicine visit and am voluntarily participating in the telemedicine visit.  I understand that I have the right to withhold or withdraw my consent to the use of telemedicine in the course of my care at any time, without affecting my right to future care or treatment, and that the Practitioner or I may terminate the telemedicine visit at any time. I understand that I have the right to inspect all information obtained and/or recorded in the course of the telemedicine visit and may receive copies of available information for a reasonable fee.  I understand that some of the potential risks of receiving the Services via telemedicine include:   Delay or interruption in medical evaluation due to technological equipment failure or disruption;  Information transmitted may not be sufficient (e.g. poor resolution of images) to allow for appropriate medical decision making by the Practitioner;  and/or   In rare instances, security protocols could fail, causing a breach of personal health information.  Furthermore, I acknowledge that it is my responsibility to provide information about my medical history, conditions and care that is complete and accurate to the best of my ability. I acknowledge that Practitioner's advice, recommendations, and/or decision may be based on factors not within their control, such as incomplete or inaccurate data provided by me or distortions of diagnostic images or specimens that may result from electronic transmissions. I understand that the practice of medicine is not an exact science and that Practitioner makes no warranties or guarantees regarding treatment outcomes. I acknowledge that a copy of this consent can be made available to me via my patient portal [facility] MyChart), or I can request a printed copy by calling the office of [facility].    I understand that my insurance will be billed for this visit.   I have read or had this consent read to me.  I understand the contents of this consent, which adequately explains the benefits and risks of the Services being provided via telemedicine.   I have been provided ample opportunity to ask questions regarding this consent and the Services and have had my questions answered to my satisfaction.  I give my informed consent for the services to be provided through the use of telemedicine in my medical care

## 2020-04-10 ENCOUNTER — Other Ambulatory Visit: Payer: Self-pay

## 2020-04-10 ENCOUNTER — Other Ambulatory Visit: Payer: Medicare HMO | Admitting: Internal Medicine

## 2020-04-10 DIAGNOSIS — Z515 Encounter for palliative care: Secondary | ICD-10-CM

## 2020-04-10 DIAGNOSIS — I679 Cerebrovascular disease, unspecified: Secondary | ICD-10-CM

## 2020-04-11 ENCOUNTER — Other Ambulatory Visit: Payer: Self-pay

## 2020-04-11 NOTE — Progress Notes (Signed)
Therapist, nutritional Palliative Care Consult Note Telephone: [phone]  Fax: [phone]  PATIENT NAME: [name] DOB: [birth year] MRN: [id number]  PRIMARY CARE PROVIDER:   Joaquim Nam, MD  REFERRING PROVIDER:  Joaquim Nam, MD [address] [place],  [place] [address]  RESPONSIBLE PARTYAlthea Grimmer Daughter [phone]  [phone]       RECOMMENDATIONS and PLAN:   Palliative care encounter  Z51.5  1.  Advance care planning: DNAR in place.  No desire for escalation of therapies and daughter is interested in readmission to hospice care when patient is eligible. She is not interested in returning to the hospital or tube feedings.   Plan on collaboration with Hospice MD and PCP for further planning. Consider re-admission to Hospice care at home.  Will phone daughter with final plan.   2.  Weakness: Increased since recurrent seizure activity.  Fall risk prevention.  May need to use w/c in home for safety.  Activities with assistance as needed. Improve hydration.  3.  Cerebrovascular disease:  1 . seizure in Sept 2021 and an unwitnessed fall 2 weeks ago at home. Increased cognitive decline and weakness since noted events. Microvascular disease noted on head CT scan from 10/2017.  FAST stage 6c which is decreased from 5a 1 month ago.  Still no plans for medicating or further work-up. Comfort care per family's request.  Provide safe environment. Consider d/c of anticoagulant if falls become more frequent.   4.  Edema:  Increased.  Leg elevation and use of knee hi compression socks.  Use of Furosemide 40mg (new RX) if no resolve or worsening of symptoms.  Monitor  5.  Grief:  Resolved   I spent 60 minutes providing this consultation,  From 1315  to 1415. More than 50% of the time in this consultation was spent coordinating communication with patient and daughter June.   HISTORY OF PRESENT ILLNESS:  [name] is a 86 y.o. year  old female with multiple medical problems including syncope,CVA,  incidental finding of bilat pulm embolis and seizure disorder.   Palliative Care was asked to help address goals of care.   CODE STATUS: DNAR  PPS: 40% weak. Requires increased assistance HOSPICE ELIGIBILITY/DIAGNOSIS: TBD  PAST MEDICAL HISTORY:      Past Medical History:  Diagnosis Date  . AK (actinic keratosis)   . Allergy   . Chronic dermatitis    face, allergic to many products  . GERD (gastroesophageal reflux disease)   . Hyperlipidemia   . Hypertension   . Kidney stones   . Overactive bladder   . Pulmonary embolism (HCC)   . Sleep apnea   . Syncope     SOCIAL HX:  Social History        Tobacco Use  . Smoking status: Former Smoker    Types: Cigarettes  . Smokeless tobacco: Never Used  . Tobacco comment: smoked during college  Substance Use Topics  . Alcohol use: No    ALLERGIES:       Allergies  Allergen Reactions  . Codeine Nausea And Vomiting and Other (See Comments)    REACTION: dizzy- but can tolerate hydrocodone.   . Levofloxacin Other (See Comments)    REACTION: itching  . Neospect [Dyphylline-Ephed-Pb-Gg Or] Other (See Comments)    unknown  . Spironolactone     Hyperkalemia   . Tramadol     Didn't help with pain  . Neosporin [Neomycin-Bacitracin Zn-Polymyx] Rash  PERTINENT MEDICATIONS:      Outpatient Encounter Medications as of 03/06/2020  Medication Sig  . acetaminophen (TYLENOL) 500 MG tablet Take 500 mg by mouth as needed for mild pain.   . camphor-phenol (CAMPHO-PHENIQUE) 10.8-4.7 % LIQD Apply 1 application topically daily as needed for mild pain or itching.  . cholecalciferol (VITAMIN D) 1000 UNITS tablet Take 1,000 Units by mouth daily.   Marland Kitchen Propylene Glycol (SYSTANE BALANCE OP) Apply 1 drop to eye as needed (dry eyes).  . [DISCONTINUED] carvedilol (COREG) 3.125 MG tablet TAKE ONE TABLET BY MOUTH TWICE A DAY WITH A MEAL  . [DISCONTINUED]  ELIQUIS 5 MG TABS tablet TAKE ONE TABLET BY MOUTH TWICE A DAY  . [DISCONTINUED] ENTRESTO 24-26 MG TAKE ONE TABLET BY MOUTH TWICE A DAY  . [DISCONTINUED] ferrous sulfate 325 (65 FE) MG tablet Take 1 tablet (325 mg total) by mouth every Monday, Wednesday, and Friday.  . [DISCONTINUED] pravastatin (PRAVACHOL) 40 MG tablet TAKE ONE TABLET BY MOUTH EVERY EVENING   No facility-administered encounter medications on file as of 03/06/2020.    PHYSICAL EXAM:   BP 120/64  P 68  R 20  Sats 98% on rm air  General: NAD, chronically ill appearing elderly female in recliner Cardiovascular: regular rate and rhythm Pulmonary: Rub bilat upper fields with expiration Abdomen: soft, nontender, + bowel sounds Extremities: 2+ edema BLE from knees to feet Skin: exposed skin is intact Neurological: Alert and oriented x 1.  She was able to provide current location and daughter's name 30 min into visit but not initially.  Slightly weaker LUE and LLE Reminiscent. Generalized weakness.  Requires assistance with ambulation along with use of walker.  Margaretha Sheffield, NP-C

## 2020-04-11 NOTE — Progress Notes (Signed)
Therapist, nutritional Palliative Care Consult Note Telephone: [phone]  Fax: [phone]  PATIENT NAME: [name] DOB: 01/31/1924 MRN: [id number]  PRIMARY CARE PROVIDER:   Joaquim Nam, MD  REFERRING PROVIDER:  Joaquim Nam, MD [address] [place],  [place] [address]  RESPONSIBLE PARTYAlthea Hudson Daughter [phone]  [phone]       RECOMMENDATIONS and PLAN:   Palliative care encounter  Z51.5  1.  Advance care planning:  Recent discharge from Hospice care. Explanation of palliative care to daughter and patient.  Daughter's goals are to continue to promote quality of life measures for her mother in their home environment.  Advanced directives in place with DNAR.  She is not interested in returning to the hospital or escalation of care.  Primary symptom currently is that of weakness and daughter/caregiver provides assistance as needed with mobility, meals and medication.  Palliative care will follow-up with patient in aprox 1 month.   2.  Weakness: Multifactorial.  Fall risk prevention discussed.  Activities with supervision as needed. Promote balanced meals and hydration.  3.  Cerebrovascular disease:  2 brief seizures(2-76min) occurred in July 2021.  No plans for medicating or further work-up. Continue walker and supervision with ambulation Monitor for re-occurences or decline in status and  report as needed.   4.  Grief:  Pending end of life of a daughter.  Support given and continue to monitor for additional needs/berevement counseling.    I spent 45 minutes providing this consultation,  From 1400  to 1445. More than 50% of the time in this consultation was spent coordinating communication with patient and daughter [name].   HISTORY OF PRESENT ILLNESS:  [name] is a 86 y.o. year old female with multiple medical problems including syncope,CVA,  incidental finding of bilat pulm embolis and seizure disorder.   Palliative Care was  asked to help address goals of care.   CODE STATUS: DNAR  PPS: 40% HOSPICE ELIGIBILITY/DIAGNOSIS: TBD  PAST MEDICAL HISTORY:  Past Medical History:  Diagnosis Date  . AK (actinic keratosis)   . Allergy   . Chronic dermatitis    face, allergic to many products  . GERD (gastroesophageal reflux disease)   . Hyperlipidemia   . Hypertension   . Kidney stones   . Overactive bladder   . Pulmonary embolism (HCC)   . Sleep apnea   . Syncope     SOCIAL HX:  Social History   Tobacco Use  . Smoking status: Former Smoker    Types: Cigarettes  . Smokeless tobacco: Never Used  . Tobacco comment: smoked during college  Substance Use Topics  . Alcohol use: No    ALLERGIES:  Allergies  Allergen Reactions  . Codeine Nausea And Vomiting and Other (See Comments)    REACTION: dizzy- but can tolerate hydrocodone.   . Levofloxacin Other (See Comments)    REACTION: itching  . Neospect [Dyphylline-Ephed-Pb-Gg Or] Other (See Comments)    unknown  . Spironolactone     Hyperkalemia   . Tramadol     Didn't help with pain  . Neosporin [Neomycin-Bacitracin Zn-Polymyx] Rash     PERTINENT MEDICATIONS:  Outpatient Encounter Medications as of 03/06/2020  Medication Sig  . acetaminophen (TYLENOL) 500 MG tablet Take 500 mg by mouth as needed for mild pain.   . camphor-phenol (CAMPHO-PHENIQUE) 10.8-4.7 % LIQD Apply 1 application topically daily as needed for mild pain or itching.  . cholecalciferol (VITAMIN D)  1000 UNITS tablet Take 1,000 Units by mouth daily.   Marland Kitchen Propylene Glycol (SYSTANE BALANCE OP) Apply 1 drop to eye as needed (dry eyes).  . [DISCONTINUED] carvedilol (COREG) 3.125 MG tablet TAKE ONE TABLET BY MOUTH TWICE A DAY WITH A MEAL  . [DISCONTINUED] ELIQUIS 5 MG TABS tablet TAKE ONE TABLET BY MOUTH TWICE A DAY  . [DISCONTINUED] ENTRESTO 24-26 MG TAKE ONE TABLET BY MOUTH TWICE A DAY  . [DISCONTINUED] ferrous sulfate 325 (65 FE) MG tablet Take 1 tablet (325 mg total) by mouth every  Monday, Wednesday, and Friday.  . [DISCONTINUED] pravastatin (PRAVACHOL) 40 MG tablet TAKE ONE TABLET BY MOUTH EVERY EVENING   No facility-administered encounter medications on file as of 03/06/2020.    PHYSICAL EXAM:   General: NAD, chronically ill appearing elderly female reclining in bed Cardiovascular: regular rate and rhythm Pulmonary: clear ant fields Abdomen: soft, nontender, + bowel sounds Extremities: trace edema, no joint deformities Skin: thin, expose skin is intact Neurological: Alert and oriented x 3 Generalized weakness.  Requires assistance in arising to a sitting position. Margaretha Sheffield, NP-C

## 2020-04-12 ENCOUNTER — Telehealth: Payer: Self-pay | Admitting: Internal Medicine

## 2020-04-12 DIAGNOSIS — Z515 Encounter for palliative care: Secondary | ICD-10-CM

## 2020-04-12 DIAGNOSIS — I679 Cerebrovascular disease, unspecified: Secondary | ICD-10-CM

## 2020-04-12 NOTE — Telephone Encounter (Signed)
After discussions with Hospice physician Dr. Gibson Ramp, secured communication with Dr. Para March and Tenny Craw related to noted cerebrovascular decline of pt, I phoned daughter [name] to affirm that [name] is eligible to be evaluated for re-admission into Hospice care.  Daughter was in agreement with plan.  Hospice referral order was received from Dr. Para March and Dr. Dietrich Pates will be her attending.  Referral order was emailed to [facility] referral center who will contact daughter. Joann Sheffield, NP-C

## 2020-04-13 ENCOUNTER — Telehealth: Payer: Medicare HMO | Admitting: Family Medicine

## 2020-04-13 ENCOUNTER — Other Ambulatory Visit: Payer: Self-pay

## 2020-04-13 DIAGNOSIS — I5022 Chronic systolic (congestive) heart failure: Secondary | ICD-10-CM

## 2020-04-15 NOTE — Progress Notes (Signed)
Late entry.  Phone note.  I called patient's daughter.  Patient is going to be readmitted to hospice.  It was okay to cancel out the visit today.  Family will update me as needed.  Offered condolences regarding the patient's decline.  Also noted that the patient's other daughter had passed away earlier this year, condolences offered about that.  I was mainly calling to check with patient and family to see if there is anything else I can do at this point.  My understanding is that Dr. Tenny Craw with cardiology will be the attending for hospice.  They will update me as needed.  There was nothing that needed to be addressed by me at this point.  Daughter thanked me for the call.  I appreciate the help of all involved.

## 2020-04-15 NOTE — Assessment & Plan Note (Signed)
Late entry.  Phone note.  I called patient's daughter.  Patient is going to be readmitted to hospice.  It was okay to cancel out the visit today.  Family will update me as needed.  Offered condolences regarding the patient's decline.  Also noted that the patient's other daughter had passed away earlier this year, condolences offered about that.  I was mainly calling to check with patient and family to see if there is anything else I can do at this point.  My understanding is that Dr. Ross with cardiology will be the attending for hospice.  They will update me as needed.  There was nothing that needed to be addressed by me at this point.  Daughter thanked me for the call.  I appreciate the help of all involved. 

## 2020-05-27 NOTE — Progress Notes (Incomplete)
  Patient Consent for Virtual Visit         [name] has provided verbal consent on 05/27/2020 for a virtual visit (video or telephone).   CONSENT FOR VIRTUAL VISIT FOR:  [name]  By participating in this virtual visit I agree to the following:  I hereby voluntarily request, consent and authorize [facility] and its employed or contracted physicians, physician assistants, nurse practitioners or other licensed health care professionals (the Practitioner), to provide me with telemedicine health care services (the "Services") as deemed necessary by the treating Practitioner. I acknowledge and consent to receive the Services by the Practitioner via telemedicine. I understand that the telemedicine visit will involve communicating with the Practitioner through live audiovisual communication technology and the disclosure of certain medical information by electronic transmission. I acknowledge that I have been given the opportunity to request an in-person assessment or other available alternative prior to the telemedicine visit and am voluntarily participating in the telemedicine visit.  I understand that I have the right to withhold or withdraw my consent to the use of telemedicine in the course of my care at any time, without affecting my right to future care or treatment, and that the Practitioner or I may terminate the telemedicine visit at any time. I understand that I have the right to inspect all information obtained and/or recorded in the course of the telemedicine visit and may receive copies of available information for a reasonable fee.  I understand that some of the potential risks of receiving the Services via telemedicine include:  Marland Kitchen Delay or interruption in medical evaluation due to technological equipment failure or disruption; . Information transmitted may not be sufficient (e.g. poor resolution of images) to allow for appropriate medical decision making by the Practitioner;  and/or  . In rare instances, security protocols could fail, causing a breach of personal health information.  Furthermore, I acknowledge that it is my responsibility to provide information about my medical history, conditions and care that is complete and accurate to the best of my ability. I acknowledge that Practitioner's advice, recommendations, and/or decision may be based on factors not within their control, such as incomplete or inaccurate data provided by me or distortions of diagnostic images or specimens that may result from electronic transmissions. I understand that the practice of medicine is not an exact science and that Practitioner makes no warranties or guarantees regarding treatment outcomes. I acknowledge that a copy of this consent can be made available to me via my patient portal [facility] MyChart), or I can request a printed copy by calling the office of [facility].    I understand that my insurance will be billed for this visit.   I have read or had this consent read to me. . I understand the contents of this consent, which adequately explains the benefits and risks of the Services being provided via telemedicine.  . I have been provided ample opportunity to ask questions regarding this consent and the Services and have had my questions answered to my satisfaction. . I give my informed consent for the services to be provided through the use of telemedicine in my medical care

## 2020-05-28 ENCOUNTER — Ambulatory Visit: Payer: Medicare HMO | Admitting: Internal Medicine

## 2020-06-05 ENCOUNTER — Telehealth: Payer: Self-pay | Admitting: Internal Medicine

## 2020-06-05 NOTE — Telephone Encounter (Signed)
Patient "graduated" and was stable over the summer and advanced to palliative care but has declined and is back with hospice.  Needed verbal order ok to continue hospice w Dr. Tenny Craw as attending.  Verbal provided per Dr. Tenny Craw.

## 2020-06-05 NOTE — Telephone Encounter (Signed)
[name] is calling requesting confirmation that it is okay to continue hospice care for [facility]. Please advise.

## 2020-06-06 NOTE — Telephone Encounter (Signed)
OK to change to hospice Rx given decline

## 2020-07-24 ENCOUNTER — Telehealth: Payer: Self-pay | Admitting: Internal Medicine

## 2020-07-24 NOTE — Telephone Encounter (Signed)
error 

## 2020-07-24 NOTE — Telephone Encounter (Signed)
She had a hx of bilateral PE   That is why she is on Eliquis   COuld stop but at risk of recurrent  Could switch entresto to losartan 50 mg   FOllow BP  I have not seen pt since 2020 in person   COuld do a televisit Keep eye on BP SHould have occasional labs (CBC, BMET)

## 2020-07-24 NOTE — Telephone Encounter (Signed)
Darl Pikes, the patient's nurse with [facility], calls with questions for Dr. Tenny Craw from the patient's family.  While the patent is overall doing OK, she sleeps a lot during the day and they are a little worried for her safety and are concerned her quality of life is not where she wants it.  The patient is on both Eliquis and Entresto and those medications are not covered by Hospice. While money is not an issue (the family has simply been paying for the meds), they would like to know if the meds are prolonging a life the patient does not necessarily want. They are worried if she falls that it would be fatal. They would like to know if she could potentially stop Eliquis and maybe take ASA instead so they feel safer. They would like to know if she could potentially stop Entresto if it is prolonging her life.  She understands she will be called with Dr. Tenny Craw' recommendations. Darl Pikes, RN: [phone]

## 2020-07-25 NOTE — Telephone Encounter (Signed)
Spoke with Darl Pikes w/ [facility]. Provided information from Dr. Tenny Craw.  She appreciates the information and will pass on to patient's family who is trying to make the most appropriate decisions for the patient.   No changes have been made at this time.

## 2020-08-06 ENCOUNTER — Telehealth: Payer: Self-pay | Admitting: Internal Medicine

## 2020-08-06 NOTE — Telephone Encounter (Signed)
Left detailed message that OK to continue hospice care per Dr. Tenny Craw.

## 2020-08-06 NOTE — Telephone Encounter (Signed)
Darl Pikes, Hospice Nurse with [facility] needs the OK from Dr. Tenny Craw to continue the patient's Hospice care. Number provided is a secure line to Darl Pikes and the office can leave verbal orders on the voicemail if needed.

## 2020-09-24 ENCOUNTER — Telehealth: Payer: Self-pay | Admitting: Internal Medicine

## 2020-09-24 MED ORDER — CARVEDILOL 3.125 MG PO TABS: 3.1250 mg | ORAL_TABLET | Freq: Two times a day (BID) | ORAL | 6 refills | Status: AC

## 2020-09-24 NOTE — Telephone Encounter (Signed)
New message:     Darl Pikes from Hospice calling to speak with a nurse. She states it would just be best for what she has to say. [phone]

## 2020-09-24 NOTE — Telephone Encounter (Signed)
Caregiver having surgery so she is in respite care for several days.  She will go back home this next weekend.   Wanted to make sure she has enough medications.  Refill of carvedilol as already been sent.  She states again that the daughters have been discussing stopping Eliquis and Entresto because of paying out of pocket for over the past year.  She asked if there would be any weaning of the meds.  Adv there is no dose decreases or weaning if she is stopping the meds.  Reviewed last notes from Dr. Tenny Craw that she is at risk for PE if stopping Eliquis and could use losartan for BP if stopping Entresto.  She will discuss w family members and call back if they want to add losartan or monitor BP.  She "spends a lot of time in bed and not very active."

## 2020-09-24 NOTE — Telephone Encounter (Signed)
*  STAT* If patient is at the pharmacy, call can be transferred to refill team.   1. Which medications need to be refilled? (please list name of each medication and dose if known) Carvedilol   2. Which pharmacy/location (including street and city if local pharmacy) is medication to be sent to? [facility]  3. Do they need a 30 day or 90 day supply? 30 with 3 refills

## 2020-09-24 NOTE — Telephone Encounter (Signed)
Pt's medication was sent to pt's pharmacy as requested. Confirmation received.  °

## 2020-10-04 ENCOUNTER — Telehealth: Payer: Self-pay | Admitting: Internal Medicine

## 2020-10-04 NOTE — Telephone Encounter (Signed)
Patient's daughter has stopped Entresto and Eliquis as of last Sunday. Daughter does not want to add anything additional  Also wanted to clarify the lasix order.  I adv 40 mg daily as needed for SOB or weight gain of 3 pounds of more.

## 2020-10-04 NOTE — Telephone Encounter (Signed)
Pt c/o swelling: STAT is pt has developed SOB within 24 hours  1) How much weight have you gained and in what time span?  No weight gain  2) If swelling, where is the swelling located?  Feet   3) Are you currently taking a fluid pill?   Darl Pikes states the patient has been taking furosemide (LASIX) 40 MG tablet. States patient's daughter has been taking the medication every other day. She is requesting more specific parameters on how often the patient needs to be taking it. Can the patient take it daily, as needed?  4) Are you currently SOB?  N/A. She is not currently with the patient   5) Do you have a log of your daily weights (if so, list)?  No log available  6) Have you gained 3 pounds in a day or 5 pounds in a week?  No  7) Have you traveled recently?  No

## 2020-10-22 ENCOUNTER — Telehealth: Payer: Self-pay | Admitting: Family Medicine

## 2020-10-22 ENCOUNTER — Telehealth: Payer: Self-pay | Admitting: Internal Medicine

## 2020-10-22 NOTE — Telephone Encounter (Signed)
Chart has been updated.

## 2020-10-22 NOTE — Telephone Encounter (Signed)
Informed today that patient had passed away early this AM.  Called her daughter to give condolences.  She thanked me for the call.   I was always glad to see [name].  At visits, she would talk to me about living in [place] and she would always radiate kindness throughout the clinic.   Please update the chart.

## 2020-10-22 NOTE — Telephone Encounter (Signed)
Darl Pikes from Hospice called to let Dr. Dietrich Pates know that this pt is deceased as of midnight 11-Nov-2020, pt was comfortable at home with her family

## 2020-10-23 ENCOUNTER — Telehealth: Payer: Self-pay | Admitting: Internal Medicine

## 2020-10-23 NOTE — Telephone Encounter (Signed)
[place] with [employer] is requesting to have Dr. Tenny Craw sign the patient's death certificate.  A call may be returned to [place] to discuss further at [phone]. Case ID #: [id number] [name]

## 2020-10-30 NOTE — Telephone Encounter (Signed)
Called back funeral home to notify them they can bring the death certificate into the office for Dr. Tenny Craw to sign which they are requesting.  AO 10/30/20

## 2020-11-21 DEATH — deceased
